# Patient Record
Sex: Male | Born: 1945 | Race: White | Hispanic: No | Smoking: Never smoker
Health system: Southern US, Community
[De-identification: ages and names within clinical notes are randomized; demographics above are authoritative.]

## PROBLEM LIST (undated history)

## (undated) DIAGNOSIS — H3530 Unspecified macular degeneration: Secondary | ICD-10-CM

## (undated) DIAGNOSIS — J189 Pneumonia, unspecified organism: Secondary | ICD-10-CM

## (undated) DIAGNOSIS — K219 Gastro-esophageal reflux disease without esophagitis: Secondary | ICD-10-CM

## (undated) DIAGNOSIS — E079 Disorder of thyroid, unspecified: Secondary | ICD-10-CM

## (undated) DIAGNOSIS — E039 Hypothyroidism, unspecified: Secondary | ICD-10-CM

## (undated) HISTORY — PX: ROTATOR CUFF REPAIR: SHX139

## (undated) HISTORY — PX: ELBOW SURGERY: SHX618

## (undated) HISTORY — PX: CHOLECYSTECTOMY: SHX55

## (undated) HISTORY — DX: Disorder of thyroid, unspecified: E07.9

## (undated) HISTORY — PX: REPAIR / REINSERT BICEPS TENDON AT ELBOW: SUR1148

## (undated) HISTORY — PX: OTHER SURGICAL HISTORY: SHX169

## (undated) HISTORY — PX: KNEE ARTHROSCOPY W/ MENISCAL REPAIR: SHX1877

## (undated) HISTORY — PX: EYE SURGERY: SHX253

## (undated) HISTORY — PX: HERNIA REPAIR: SHX51

## (undated) HISTORY — PX: TONSILLECTOMY: SUR1361

---

## 2004-08-03 ENCOUNTER — Ambulatory Visit (HOSPITAL_COMMUNITY): Admission: RE | Admit: 2004-08-03 | Discharge: 2004-08-03 | Payer: Self-pay | Admitting: Ophthalmology

## 2015-08-15 ENCOUNTER — Ambulatory Visit (INDEPENDENT_AMBULATORY_CARE_PROVIDER_SITE_OTHER): Payer: Medicare Other | Admitting: Emergency Medicine

## 2015-08-15 ENCOUNTER — Ambulatory Visit (INDEPENDENT_AMBULATORY_CARE_PROVIDER_SITE_OTHER): Payer: Medicare Other

## 2015-08-15 VITALS — BP 138/88 | HR 65 | Temp 98.4°F | Resp 16 | Ht 70.5 in | Wt 215.2 lb

## 2015-08-15 DIAGNOSIS — Z23 Encounter for immunization: Secondary | ICD-10-CM

## 2015-08-15 DIAGNOSIS — M5441 Lumbago with sciatica, right side: Secondary | ICD-10-CM | POA: Diagnosis not present

## 2015-08-15 MED ORDER — PREDNISONE 10 MG PO TABS: ORAL_TABLET | ORAL | Status: DC

## 2015-08-15 MED ORDER — HYDROCODONE-ACETAMINOPHEN 5-325 MG PO TABS: 1.0000 | ORAL_TABLET | Freq: Four times a day (QID) | ORAL | Status: DC | PRN

## 2015-08-15 NOTE — Patient Instructions (Signed)

## 2015-08-15 NOTE — Progress Notes (Signed)
This chart was scribed for Arlyss Queen, MD by Leandra Kern, Medical Scribe. This patient was seen in Room 14 and the patient's care was started at 9:29 AM.  Chief Complaint:  Chief Complaint  Patient presents with  . Back Pain  . Flu Vaccine    HPI: Gilbert Rangel is a 69 y.o. male who reports to [facility] today complaining of lower back pain, onset 4 days ago.  Pt indicates the pain to be very severe and worsening, most severe last night. He reports that it has started to radiate to his legs bilaterally. He notes the symptoms to be atraumatic. Pt states that he does have a previous history of back pain for which he used to get massage treatments for. Pt denies bladder or bowel incontinence. 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   Pt is hard of hearing.    Past Medical History  Diagnosis Date  . Thyroid disease    History reviewed. No pertinent past surgical history. Social History   Social History  . Marital Status: Divorced    Spouse Name: N/A  . Number of Children: N/A  . Years of Education: N/A   Social History Main Topics  . Smoking status: Never Smoker   . Smokeless tobacco: None  . Alcohol Use: None  . Drug Use: None  . Sexual Activity: Not Asked   Other Topics Concern  . None   Social History Narrative  . None   Family History  Problem Relation Age of Onset  . Diabetes Mother   . Cancer Father    No Known Allergies Prior to Admission medications   Medication Sig Start Date End Date Taking? Authorizing Provider  levothyroxine (SYNTHROID, LEVOTHROID) 75 MCG tablet Take 75 mcg by mouth daily before breakfast.   Yes Historical Provider, MD     ROS: The patient has back pain.  Pt denies bladder or bowel incontinence.   All other systems have been reviewed and were otherwise negative with the exception of those mentioned in the HPI and as above.    PHYSICAL EXAM: Filed Vitals:   08/15/15 0907  BP: 138/88  Pulse: 65    Temp: 98.4 F (36.9 C)  Resp: 16   Body mass index is 30.43 kg/(m^2).   General: Alert, no acute distress HEENT:  Normocephalic, atraumatic, oropharynx patent. Eye: [facility] Cardiovascular:  Regular rate and rhythm, no rubs murmurs or gallops.  No Carotid bruits, radial pulse intact. No pedal edema.  Respiratory: Clear to auscultation bilaterally.  No wheezes, rales, or rhonchi.  No cyanosis, no use of accessory musculature Abdominal: No organomegaly, abdomen is soft and non-tender, positive bowel sounds.  No masses. Musculoskeletal: Gait intact. No edema. Tender L5 S1 on the right. Positive straight leg test on the right at 70 degrees.  Skin: No rashes. Neurologic: Facial musculature symmetric. Reflexes in the knees are absent. 2+ ankle reflexes.[place] are 5/5 all muscles.  Psychiatric: Patient acts appropriately throughout our interaction. Lymphatic: No cervical or submandibular lymphadenopathy    LABS:    EKG/XRAY:   Primary read interpreted by Dr. Everlene Farrier at [facility]. Patient has arthritic changes with spur formation there is narrowing of the disc at L2-L3. ASSESSMENT/PLAN: Patient given hydrocodone for severe pain. He was placed on a prednisone taper. He will follow-up with another caregiver from massage tomorrow. Recheck towards the end of the week if he continues to have symptoms. Patient may need further imaging.I personally performed the services  described in this documentation, which was scribed in my presence. The recorded information has been reviewed and is accurate.   By signing my name below, I, Rawaa Al Rifaie, attest that this documentation has been prepared under the direction and in the presence of Arlyss Queen, MD.  Leandra Kern, Medical Scribe. 08/15/2015.  9:38 AM.     Johney Maine sideeffects, risk and benefits, and alternatives of medications d/w patient. Patient is aware that all medications have potential sideeffects and we are unable to predict every  sideeffect or drug-drug interaction that may occur.  Arlyss Queen MD 08/15/2015 9:29 AM

## 2015-08-17 ENCOUNTER — Telehealth: Payer: Self-pay

## 2015-08-17 NOTE — Telephone Encounter (Signed)
He needs to return to clinic tomorrow so I can reexamine him. He cannot take that medication and work. We can discuss other medication options on his follow-up visit

## 2015-08-17 NOTE — Telephone Encounter (Signed)
Daub - Pt said you prescribed him oxycodone on Sunday.  He wants to know if you could prescribe something a little stronger or if he can take two pills in stead of one.  However, he wants to still be able to work while using the medication.  [phone]

## 2015-08-18 ENCOUNTER — Ambulatory Visit (INDEPENDENT_AMBULATORY_CARE_PROVIDER_SITE_OTHER): Payer: Medicare Other | Admitting: Emergency Medicine

## 2015-08-18 VITALS — BP 120/72 | HR 91 | Temp 98.2°F | Resp 18 | Ht 70.5 in | Wt 221.0 lb

## 2015-08-18 DIAGNOSIS — M5441 Lumbago with sciatica, right side: Secondary | ICD-10-CM

## 2015-08-18 MED ORDER — HYDROCODONE-ACETAMINOPHEN 7.5-325 MG PO TABS: 1.0000 | ORAL_TABLET | Freq: Four times a day (QID) | ORAL | Status: DC | PRN

## 2015-08-18 NOTE — Telephone Encounter (Signed)
Called and spoke with pt and he is aware that Dr. Everlene Farrier wants to see him today.  Pt will come in today to be seen and discuss other options for pain medication per Dr. Everlene Farrier.  Nothing further is needed. Pt is aware that Dr. Everlene Farrier is here today until 67

## 2015-08-18 NOTE — Patient Instructions (Signed)

## 2015-08-18 NOTE — Progress Notes (Signed)
Patient ID: Gilbert Rangel, male   DOB: [birth year], 69 y.o.   MRN: [id number]    By signing my name below, I, Essence Howell, attest that this documentation has been prepared under the direction and in the presence of Darlyne Russian, MD Electronically Signed: Ladene Artist, ED Scribe 08/18/2015 at 2:34 PM.  Chief Complaint:  Chief Complaint  Patient presents with  . Follow-up  . Sciatica   HPI: Gilbert Rangel is a 69 y.o. male who reports to [facility] today for a follow-up regarding sciatica. Pt states that back pain has improved but the stabbing pain has traveled into his buttocks and lower extremities. He states that he is able to stand for approximately 5 minutes prior to the onset of pain. He recently received a massage by a masseuse without significant relief. Pt has also followed up with a chiropractor with relief; his next appointment is tomorrow. He recently started a 10 day course of Prednisone 4 days ago. Additionally, pt has tried ice, Aleve and Norco with mild relief.   Past Medical History  Diagnosis Date  . Thyroid disease    History reviewed. No pertinent past surgical history. Social History   Social History  . Marital Status: Divorced    Spouse Name: N/A  . Number of Children: N/A  . Years of Education: N/A   Social History Main Topics  . Smoking status: Never Smoker   . Smokeless tobacco: None  . Alcohol Use: None  . Drug Use: None  . Sexual Activity: Not Asked   Other Topics Concern  . None   Social History Narrative   Family History  Problem Relation Age of Onset  . Diabetes Mother   . Cancer Father    No Known Allergies Prior to Admission medications   Medication Sig Start Date End Date Taking? Authorizing Provider  HYDROcodone-acetaminophen (NORCO) 5-325 MG tablet Take 1 tablet by mouth every 6 (six) hours as needed for moderate pain. 08/15/15  Yes Darlyne Russian, MD  levothyroxine (SYNTHROID, LEVOTHROID) 75 MCG tablet Take 75 mcg by mouth daily before  breakfast.   Yes Historical Provider, MD  predniSONE (DELTASONE) 10 MG tablet Take 4 a day for 3 days 3 a day for 3 days 2 a day for 3 days one a day for 3 days 08/15/15  Yes Darlyne Russian, MD     ROS: The patient denies fevers, chills, night sweats, unintentional weight loss, chest pain, palpitations, wheezing, dyspnea on exertion, nausea, vomiting, abdominal pain, dysuria, hematuria, melena, numbness, weakness, or tingling. +back pain, +myalgias   All other systems have been reviewed and were otherwise negative with the exception of those mentioned in the HPI and as above.    PHYSICAL EXAM: Filed Vitals:   08/18/15 1339  BP: 120/72  Pulse: 91  Temp: 98.2 F (36.8 C)  Resp: 18   Body mass index is 31.25 kg/(m^2).  General: Alert, no acute distress HEENT:  Normocephalic, atraumatic, oropharynx patent. Eye: [name] Cardiovascular:  Regular rate and rhythm, no rubs murmurs or gallops.  No Carotid bruits, radial pulse intact. No pedal edema.  Respiratory: Clear to auscultation bilaterally.  No wheezes, rales, or rhonchi.  No cyanosis, no use of accessory musculature Abdominal: No organomegaly, abdomen is soft and non-tender, positive bowel sounds.  No masses. Musculoskeletal: Gait intact. No edema. Tenderness in lower back on the R. Straight leg on the R is positive at 90 degrees.  Skin: No rashes. Neurologic: Facial musculature symmetric. Motor strength  is 5/5. Psychiatric: Patient acts appropriately throughout our interaction. Lymphatic: No cervical or submandibular lymphadenopathy  LABS:  EKG/XRAY:   Primary read interpreted by Dr. Everlene Farrier at [facility].   ASSESSMENT/PLAN: He seemed to improve with chiropractic care. We'll continue this for now. I increased his hydrocodone to 7.5 mg.I personally performed the services described in this documentation, which was scribed in my presence. The recorded information has been reviewed and is accurate.   Gross sideeffects, risk and  benefits, and alternatives of medications d/w patient. Patient is aware that all medications have potential sideeffects and we are unable to predict every sideeffect or drug-drug interaction that may occur.  Arlyss Queen MD 08/18/2015 2:24 PM

## 2015-08-20 ENCOUNTER — Telehealth: Payer: Self-pay

## 2015-08-20 NOTE — Telephone Encounter (Signed)
Pt's son came and stated the patient was supposed to get a Rx for his pain medication. From the chart it looks like he received #20 of Hydrocodone at the visit. I am not sure what is going on but the son came back and states he never received the Rx. Can we check the database. Thanks.

## 2015-08-20 NOTE — Telephone Encounter (Signed)
Per the controlled substance database, the prescription was filled on 08/15/15 at [facility] on [address].

## 2015-08-21 MED ORDER — HYDROCODONE-ACETAMINOPHEN 7.5-325 MG PO TABS: 1.0000 | ORAL_TABLET | Freq: Four times a day (QID) | ORAL | Status: DC | PRN

## 2015-08-21 NOTE — Telephone Encounter (Signed)
At Wednesday's visit, he was supposed to get an increased dose of pain medication.  Patient did not get the prescription printed by Dr. Everlene Farrier on 08/18/15 (increased from 5 to 7.5 mg).  Database reviewed. He has 3-4 of the 5 mg tabs remaining.  Advised he can take 1.5 tabs until he gets the new Rx picked up and filled.  His son will return to get the Rx.  Meds ordered this encounter  Medications  . HYDROcodone-acetaminophen (NORCO) 7.5-325 MG tablet    Sig: Take 1 tablet by mouth every 6 (six) hours as needed.    Dispense:  20 tablet    Refill:  0    Order Specific Question:  Supervising Provider    Answer:  DOOLITTLE, ROBERT P [[id number]]

## 2015-08-22 ENCOUNTER — Other Ambulatory Visit: Payer: Self-pay | Admitting: Emergency Medicine

## 2015-08-22 MED ORDER — HYDROCODONE-ACETAMINOPHEN 7.5-325 MG PO TABS: 1.0000 | ORAL_TABLET | Freq: Four times a day (QID) | ORAL | Status: DC | PRN

## 2015-08-22 NOTE — Telephone Encounter (Signed)
All patient he can pick up the medication.

## 2015-08-22 NOTE — Telephone Encounter (Signed)
Patient is aware 

## 2015-08-24 ENCOUNTER — Emergency Department (HOSPITAL_COMMUNITY)
Admission: EM | Admit: 2015-08-24 | Discharge: 2015-08-25 | Disposition: A | Discharge status: Home / Self Care | Payer: Medicare Other | Source: Home / Self Care | Attending: Emergency Medicine | Admitting: Emergency Medicine

## 2015-08-24 ENCOUNTER — Encounter (HOSPITAL_COMMUNITY): Payer: Self-pay | Admitting: Emergency Medicine

## 2015-08-24 DIAGNOSIS — J069 Acute upper respiratory infection, unspecified: Secondary | ICD-10-CM | POA: Diagnosis not present

## 2015-08-24 DIAGNOSIS — M5431 Sciatica, right side: Secondary | ICD-10-CM

## 2015-08-24 DIAGNOSIS — E079 Disorder of thyroid, unspecified: Secondary | ICD-10-CM | POA: Insufficient documentation

## 2015-08-24 DIAGNOSIS — A419 Sepsis, unspecified organism: Secondary | ICD-10-CM | POA: Diagnosis not present

## 2015-08-24 DIAGNOSIS — Z79899 Other long term (current) drug therapy: Secondary | ICD-10-CM | POA: Insufficient documentation

## 2015-08-24 MED ORDER — GABAPENTIN 600 MG PO TABS: 300.0000 mg | ORAL_TABLET | Freq: Once | ORAL | Status: DC

## 2015-08-24 MED ORDER — HYDROMORPHONE HCL 1 MG/ML IJ SOLN: 1.0000 mg | Freq: Once | INTRAMUSCULAR | Status: DC

## 2015-08-24 MED ORDER — DEXAMETHASONE SODIUM PHOSPHATE 10 MG/ML IJ SOLN
10.0000 mg | Freq: Once | INTRAMUSCULAR | Status: AC
  Administered 2015-08-25: 10 mg via INTRAVENOUS
  Filled 2015-08-24: qty 1

## 2015-08-24 MED ORDER — GABAPENTIN 300 MG PO CAPS
300.0000 mg | ORAL_CAPSULE | Freq: Once | ORAL | Status: AC
  Administered 2015-08-25: 300 mg via ORAL
  Filled 2015-08-24: qty 1

## 2015-08-24 NOTE — ED Provider Notes (Signed)
CSN: [id number]     Arrival date & time 08/24/15  2318 History   By signing my name below, I, Forrestine Him, attest that this documentation has been prepared under the direction and in the presence of Merryl Hacker, MD.  Electronically Signed: Forrestine Him, ED Scribe. 08/24/2015. 11:40 PM.   Chief Complaint  Patient presents with  . Knee Pain  . Back Pain   The history is provided by the patient. No language interpreter was used.    HPI Comments: Gilbert Rangel brought in by EMS is a 69 y.o. male with a PMHx of thyroid disease who presents to the Emergency Department complaining of constant, ongoing lower back pain that radiates down the legs and R knee pain x 1 week; worsened this evening. Pain is described as sharp. Denies any recent injury or trauma to back or knee. Discomfort is made worse with movement. No alleviating factors. Prescribed Oxycodone  by Dr. Alfonso Ramus ([place]) attempted at home without any improvement. 5 doses of Oxycodone and 1 dose of Valium taken today. However, 250 mcg of Fentanyl also given en route to department without any relief. No recent fever, chills, nausea, vomiting, chest pain, or shortness of breath. No weakness, loss of sensation, or numbness. Denies any bowel or urinary incontinence. No saddle anesthesia. Denies any history of diabetes. Last follow up with orthopedics earlier this week. 2 injections given at that time, however, symptoms have not improved. No known allergies to medications.  PCP: Glenda Chroman., MD   Past Medical History  Diagnosis Date  . Thyroid disease    History reviewed. No pertinent past surgical history. Family History  Problem Relation Age of Onset  . Diabetes Mother   . Cancer Father    Social History  Substance Use Topics  . Smoking status: Never Smoker   . Smokeless tobacco: None  . Alcohol Use: None    Review of Systems  Constitutional: Negative for fever and chills.  Respiratory: Negative for cough and  shortness of breath.   Cardiovascular: Negative for chest pain.  Gastrointestinal: Negative for nausea, vomiting and abdominal pain.  Musculoskeletal: Positive for back pain and arthralgias.  Neurological: Negative for weakness and numbness.  Psychiatric/Behavioral: Negative for confusion.  All other systems reviewed and are negative.     Allergies  Review of patient's allergies indicates no known allergies.  Home Medications   Prior to Admission medications   Medication Sig Start Date End Date Taking? Authorizing Provider  diazepam (VALIUM) 5 MG tablet Take 5 mg by mouth every 6 (six) hours as needed for anxiety.   Yes Historical Provider, MD  levothyroxine (SYNTHROID, LEVOTHROID) 75 MCG tablet Take 75 mcg by mouth daily before breakfast.   Yes Historical Provider, MD  oxyCODONE-acetaminophen (PERCOCET) 10-325 MG tablet Take 2 tablets by mouth every 6 (six) hours as needed for pain.   Yes Historical Provider, MD  Pseudoephedrine-APAP-DM (DAYQUIL PO) Take 30 mLs by mouth daily as needed (cough).   Yes Historical Provider, MD  gabapentin (NEURONTIN) 300 MG capsule Take 1 capsule (300 mg total) by mouth 2 (two) times daily. 08/25/15   Merryl Hacker, MD  methylPREDNISolone (MEDROL DOSEPAK) 4 MG TBPK tablet Take as directed on packet. 08/25/15   Merryl Hacker, MD  predniSONE (DELTASONE) 10 MG tablet Take 4 a day for 3 days 3 a day for 3 days 2 a day for 3 days one a day for 3 days 08/15/15   Darlyne Russian, MD   Triage  Vitals: BP 116/66 mmHg  Pulse 88  Temp(Src) 98.2 F (36.8 C) (Oral)  Resp 15  SpO2 97%   Physical Exam  Constitutional: He is oriented to person, place, and time. No distress.  HENT:  Head: Normocephalic and atraumatic.  Cardiovascular: Normal rate and regular rhythm.   Pulmonary/Chest: Effort normal. No respiratory distress.  Abdominal: Soft. There is no tenderness.  Musculoskeletal: He exhibits no edema.  Tenderness palpation right paraspinous and right  lower lumbar region without step-off or deformity, positive right straight leg raise  Neurological: He is alert and oriented to person, place, and time.  Normal reflexes bilaterally  Skin: Skin is warm and dry.  Psychiatric: He has a normal mood and affect.  Nursing note and vitals reviewed.   ED Course  Procedures (including critical care time)  DIAGNOSTIC STUDIES: Oxygen Saturation is 95% on RA, adequate by my interpretation.    COORDINATION OF CARE: 11:35 PM-Discussed treatment plan with pt at bedside and pt agreed to plan.     Labs Review Labs Reviewed - No data to display  Imaging Review No results found. I have personally reviewed and evaluated these images and lab results as part of my medical decision-making.   EKG Interpretation None      MDM   Final diagnoses:  Sciatica of right side    Patient presents with lower back pain and right leg pain. Has been seen by his orthopedist and has an MRI scheduled for Friday. Reports increasing pain. Had increased dosage of his pain medication today. Denies injury. Otherwise no other red flags of back pain. Denies any symptoms of cauda equina. Normal neurologic exam with positive straight leg raise. Patient was given Decadron, Dilaudid, and gabapentin. No indication at this time for an emergent MRI given that he has a MRI scheduled in 2 days.  On reevaluation, patient reports drastic improvement of his pain. He was able to ambulate. He was noted to be slightly unstable. He has received a lot of narcotic pain medication today including thin on rounds Dilaudid. He denies any dizziness or lightheadedness. Will discharge home. Will start on a Medrol Dosepak and gabapentin. Patient was given return precautions.  After history, exam, and medical workup I feel the patient has been appropriately medically screened and is safe for discharge home. Pertinent diagnoses were discussed with the patient. Patient was given return  precautions.   I personally performed the services described in this documentation, which was scribed in my presence. The recorded information has been reviewed and is accurate.   Merryl Hacker, MD 08/25/15 [phone]

## 2015-08-24 NOTE — ED Notes (Signed)
Pt in EMS from home, reports lower back pain and R knee pain present 1 week, got worse this evening. Scheduled to have MRI done. Denies injury to area. Given 261mcg fentanyl en route

## 2015-08-25 MED ORDER — METHYLPREDNISOLONE 4 MG PO TBPK: ORAL_TABLET | ORAL | Status: DC

## 2015-08-25 MED ORDER — GABAPENTIN 300 MG PO CAPS: 300.0000 mg | ORAL_CAPSULE | Freq: Two times a day (BID) | ORAL | Status: DC

## 2015-08-25 NOTE — Discharge Instructions (Signed)

## 2015-08-25 NOTE — ED Notes (Addendum)
Pt ambulated with 2 staff assist and with use of walker. Walker provided incase pt felt unsteady on feet secondary to knee pain & pain medications given. Pt ambulated fine, no dizziness or lightheadedness, no pain. EDP notified

## 2015-08-27 ENCOUNTER — Emergency Department (HOSPITAL_COMMUNITY): Payer: Medicare Other

## 2015-08-27 ENCOUNTER — Inpatient Hospital Stay (HOSPITAL_COMMUNITY)
Admission: EM | Admit: 2015-08-27 | Discharge: 2015-08-30 | DRG: 871 | Disposition: A | Discharge status: Home / Self Care | Payer: Medicare Other | Attending: Internal Medicine | Admitting: Internal Medicine

## 2015-08-27 ENCOUNTER — Encounter (HOSPITAL_COMMUNITY): Payer: Self-pay | Admitting: Emergency Medicine

## 2015-08-27 DIAGNOSIS — M545 Low back pain: Secondary | ICD-10-CM | POA: Diagnosis present

## 2015-08-27 DIAGNOSIS — N179 Acute kidney failure, unspecified: Secondary | ICD-10-CM | POA: Diagnosis present

## 2015-08-27 DIAGNOSIS — R4182 Altered mental status, unspecified: Secondary | ICD-10-CM | POA: Diagnosis not present

## 2015-08-27 DIAGNOSIS — E039 Hypothyroidism, unspecified: Secondary | ICD-10-CM | POA: Diagnosis present

## 2015-08-27 DIAGNOSIS — A419 Sepsis, unspecified organism: Principal | ICD-10-CM | POA: Diagnosis present

## 2015-08-27 DIAGNOSIS — R740 Nonspecific elevation of levels of transaminase and lactic acid dehydrogenase [LDH]: Secondary | ICD-10-CM | POA: Diagnosis present

## 2015-08-27 DIAGNOSIS — M5441 Lumbago with sciatica, right side: Secondary | ICD-10-CM | POA: Diagnosis not present

## 2015-08-27 DIAGNOSIS — J069 Acute upper respiratory infection, unspecified: Secondary | ICD-10-CM | POA: Diagnosis not present

## 2015-08-27 DIAGNOSIS — M5126 Other intervertebral disc displacement, lumbar region: Secondary | ICD-10-CM | POA: Diagnosis present

## 2015-08-27 DIAGNOSIS — R410 Disorientation, unspecified: Secondary | ICD-10-CM | POA: Diagnosis not present

## 2015-08-27 DIAGNOSIS — G9341 Metabolic encephalopathy: Secondary | ICD-10-CM | POA: Diagnosis present

## 2015-08-27 DIAGNOSIS — M79604 Pain in right leg: Secondary | ICD-10-CM | POA: Diagnosis present

## 2015-08-27 DIAGNOSIS — R9431 Abnormal electrocardiogram [ECG] [EKG]: Secondary | ICD-10-CM | POA: Diagnosis present

## 2015-08-27 DIAGNOSIS — R7401 Elevation of levels of liver transaminase levels: Secondary | ICD-10-CM

## 2015-08-27 DIAGNOSIS — G9340 Encephalopathy, unspecified: Secondary | ICD-10-CM | POA: Diagnosis present

## 2015-08-27 DIAGNOSIS — J189 Pneumonia, unspecified organism: Secondary | ICD-10-CM | POA: Diagnosis present

## 2015-08-27 DIAGNOSIS — R030 Elevated blood-pressure reading, without diagnosis of hypertension: Secondary | ICD-10-CM | POA: Diagnosis not present

## 2015-08-27 DIAGNOSIS — B349 Viral infection, unspecified: Secondary | ICD-10-CM | POA: Diagnosis not present

## 2015-08-27 DIAGNOSIS — E860 Dehydration: Secondary | ICD-10-CM | POA: Diagnosis present

## 2015-08-27 DIAGNOSIS — Z720 Tobacco use: Secondary | ICD-10-CM | POA: Diagnosis not present

## 2015-08-27 LAB — I-STAT CHEM 8, ED
BUN: 28 mg/dL — ABNORMAL HIGH (ref 6–20)
CHLORIDE: 97 mmol/L — AB (ref 101–111)
Calcium, Ion: 1.17 mmol/L (ref 1.13–1.30)
Creatinine, Ser: 1.3 mg/dL — ABNORMAL HIGH (ref 0.61–1.24)
Glucose, Bld: 122 mg/dL — ABNORMAL HIGH (ref 65–99)
HEMATOCRIT: 53 % — AB (ref 39.0–52.0)
HEMOGLOBIN: 18 g/dL — AB (ref 13.0–17.0)
POTASSIUM: 4.1 mmol/L (ref 3.5–5.1)
Sodium: 136 mmol/L (ref 135–145)
TCO2: 27 mmol/L (ref 0–100)

## 2015-08-27 LAB — RAPID URINE DRUG SCREEN, HOSP PERFORMED
AMPHETAMINES: NOT DETECTED
BARBITURATES: NOT DETECTED
BENZODIAZEPINES: POSITIVE — AB
COCAINE: NOT DETECTED
Opiates: POSITIVE — AB
Tetrahydrocannabinol: NOT DETECTED

## 2015-08-27 LAB — DIFFERENTIAL
BASOS ABS: 0 10*3/uL (ref 0.0–0.1)
BASOS PCT: 0 %
EOS ABS: 0.1 10*3/uL (ref 0.0–0.7)
Eosinophils Relative: 1 %
Lymphocytes Relative: 9 %
Lymphs Abs: 1.5 10*3/uL (ref 0.7–4.0)
MONOS PCT: 8 %
Monocytes Absolute: 1.3 10*3/uL — ABNORMAL HIGH (ref 0.1–1.0)
NEUTROS ABS: 13.5 10*3/uL — AB (ref 1.7–7.7)
NEUTROS PCT: 82 %

## 2015-08-27 LAB — COMPREHENSIVE METABOLIC PANEL
ALBUMIN: 4.2 g/dL (ref 3.5–5.0)
ALT: 153 U/L — ABNORMAL HIGH (ref 17–63)
AST: 32 U/L (ref 15–41)
Alkaline Phosphatase: 133 U/L — ABNORMAL HIGH (ref 38–126)
Anion gap: 12 (ref 5–15)
BUN: 21 mg/dL — AB (ref 6–20)
CHLORIDE: 95 mmol/L — AB (ref 101–111)
CO2: 27 mmol/L (ref 22–32)
Calcium: 9.9 mg/dL (ref 8.9–10.3)
Creatinine, Ser: 1.35 mg/dL — ABNORMAL HIGH (ref 0.61–1.24)
GFR calc Af Amer: >60 mL/min (ref >60)
GFR, EST NON AFRICAN AMERICAN: 52 mL/min — AB (ref >60)
Glucose, Bld: 118 mg/dL — ABNORMAL HIGH (ref 65–99)
POTASSIUM: 4.1 mmol/L (ref 3.5–5.1)
SODIUM: 134 mmol/L — AB (ref 135–145)
Total Bilirubin: 1.1 mg/dL (ref 0.3–1.2)
Total Protein: 7.9 g/dL (ref 6.5–8.1)

## 2015-08-27 LAB — URINALYSIS, ROUTINE W REFLEX MICROSCOPIC
Bilirubin Urine: NEGATIVE
GLUCOSE, UA: NEGATIVE mg/dL
Hgb urine dipstick: NEGATIVE
Ketones, ur: NEGATIVE mg/dL
LEUKOCYTES UA: NEGATIVE
Nitrite: NEGATIVE
PH: 5.5 (ref 5.0–8.0)
Protein, ur: NEGATIVE mg/dL
Specific Gravity, Urine: 1.021 (ref 1.005–1.030)
Urobilinogen, UA: 1 mg/dL (ref 0.0–1.0)

## 2015-08-27 LAB — I-STAT CG4 LACTIC ACID, ED: LACTIC ACID, VENOUS: 1.57 mmol/L (ref 0.5–2.0)

## 2015-08-27 LAB — CBC
HCT: 48 % (ref 39.0–52.0)
Hemoglobin: 17.2 g/dL — ABNORMAL HIGH (ref 13.0–17.0)
MCH: 32.6 pg (ref 26.0–34.0)
MCHC: 35.8 g/dL (ref 30.0–36.0)
MCV: 91.1 fL (ref 78.0–100.0)
PLATELETS: 197 10*3/uL (ref 150–400)
RBC: 5.27 MIL/uL (ref 4.22–5.81)
RDW: 13.3 % (ref 11.5–15.5)
WBC: 16.5 10*3/uL — AB (ref 4.0–10.5)

## 2015-08-27 LAB — I-STAT TROPONIN, ED: TROPONIN I, POC: 0 ng/mL (ref 0.00–0.08)

## 2015-08-27 LAB — PROTIME-INR
INR: 1.03 (ref 0.00–1.49)
PROTHROMBIN TIME: 13.7 s (ref 11.6–15.2)

## 2015-08-27 LAB — ETHANOL

## 2015-08-27 LAB — APTT: APTT: 26 s (ref 24–37)

## 2015-08-27 MED ORDER — SODIUM CHLORIDE 0.9 % IV BOLUS (SEPSIS)
1000.0000 mL | Freq: Once | INTRAVENOUS | Status: AC
  Administered 2015-08-27: 1000 mL via INTRAVENOUS

## 2015-08-27 MED ORDER — DEXTROSE 5 % IV SOLN
1.0000 g | Freq: Once | INTRAVENOUS | Status: AC
  Administered 2015-08-27: 1 g via INTRAVENOUS
  Filled 2015-08-27: qty 10

## 2015-08-27 MED ORDER — DEXTROSE 5 % IV SOLN
500.0000 mg | Freq: Once | INTRAVENOUS | Status: AC
  Administered 2015-08-28: 500 mg via INTRAVENOUS
  Filled 2015-08-27: qty 500

## 2015-08-27 MED ORDER — ACETAMINOPHEN 650 MG RE SUPP
650.0000 mg | Freq: Once | RECTAL | Status: AC
  Administered 2015-08-27: 650 mg via RECTAL
  Filled 2015-08-27: qty 1

## 2015-08-27 NOTE — ED Provider Notes (Signed)
CSN: [id number]     Arrival date & time 08/27/15  2127 History   First MD Initiated Contact with Patient 08/27/15 2135     Chief Complaint  Patient presents with  . Code Stroke    @EDPCLEARED @ (Consider location/radiation/quality/duration/timing/severity/associated sxs/prior Treatment) HPI.....Marland Kitchen level V caveat for urgent need for intervention and acuity.  Code stroke initially activated for altered mental status.  Most of history obtained from son. Patient has been recently evaluated for low back pain by local orthopedic surgeon.  He has been on opiates and Valium.  Son reports altered mental status about 4:00 today described as "staring into space and not responding". His gait was ataxic. Patient was then transferred by EMS to the emergency department. Fever and cough noted now. No stiff neck, dysuria, chest pain  Past Medical History  Diagnosis Date  . Thyroid disease    History reviewed. No pertinent past surgical history. Family History  Problem Relation Age of Onset  . Diabetes Mother   . Cancer Father    Social History  Substance Use Topics  . Smoking status: Never Smoker   . Smokeless tobacco: Current User  . Alcohol Use: 0.0 oz/week    0 Standard drinks or equivalent per week     Comment: occasionally    Review of Systems  Reason unable to perform ROS: urgent need for intervention.      Allergies  Review of patient's allergies indicates no known allergies.  Home Medications   Prior to Admission medications   Medication Sig Start Date End Date Taking? Authorizing Provider  diazepam (VALIUM) 5 MG tablet Take 5 mg by mouth every 6 (six) hours as needed for anxiety.   Yes Historical Provider, MD  gabapentin (NEURONTIN) 300 MG capsule Take 1 capsule (300 mg total) by mouth 2 (two) times daily. 08/25/15  Yes Merryl Hacker, MD  levothyroxine (SYNTHROID, LEVOTHROID) 75 MCG tablet Take 75 mcg by mouth daily before breakfast.   Yes Historical Provider, MD   methylPREDNISolone (MEDROL DOSEPAK) 4 MG TBPK tablet Take as directed on packet. 08/25/15  Yes Merryl Hacker, MD  oxyCODONE-acetaminophen (PERCOCET) 10-325 MG tablet Take 2 tablets by mouth every 6 (six) hours as needed for pain.   Yes Historical Provider, MD  Pseudoephedrine-APAP-DM (DAYQUIL PO) Take 30 mLs by mouth daily as needed (cough).   Yes Historical Provider, MD  predniSONE (DELTASONE) 10 MG tablet Take 4 a day for 3 days 3 a day for 3 days 2 a day for 3 days one a day for 3 days Patient not taking: Reported on 08/27/2015 08/15/15   Darlyne Russian, MD   BP 131/73 mmHg  Pulse 110  Temp(Src) 103.4 F (39.7 C) (Rectal)  Resp 20  SpO2 93% Physical Exam  Constitutional:  Red-faced, coughing, confused, febrile.  HENT:  Head: Normocephalic and atraumatic.  Red face  Eyes: Conjunctivae and EOM are normal. Pupils are equal, round, and reactive to light.  Neck: Normal range of motion. Neck supple.  No meningeal signs  Cardiovascular: Normal rate and regular rhythm.   Pulmonary/Chest: Effort normal and breath sounds normal.  Abdominal: Soft. Bowel sounds are normal.  Musculoskeletal: Normal range of motion.  Neurological:  Moving all extremities. [place] name but not place or time  Skin: Skin is warm and dry.  Psychiatric: He has a normal mood and affect. His behavior is normal.  Nursing note and vitals reviewed.   ED Course  Procedures (including critical care time) Labs Review Labs Reviewed  CBC -  Abnormal; Notable for the following:    WBC 16.5 (*)    Hemoglobin 17.2 (*)    All other components within normal limits  DIFFERENTIAL - Abnormal; Notable for the following:    Neutro Abs 13.5 (*)    Monocytes Absolute 1.3 (*)    All other components within normal limits  COMPREHENSIVE METABOLIC PANEL - Abnormal; Notable for the following:    Sodium 134 (*)    Chloride 95 (*)    Glucose, Bld 118 (*)    BUN 21 (*)    Creatinine, Ser 1.35 (*)    ALT 153 (*)    Alkaline  Phosphatase 133 (*)    GFR calc non Af Amer 52 (*)    All other components within normal limits  URINE RAPID DRUG SCREEN, HOSP PERFORMED - Abnormal; Notable for the following:    Opiates POSITIVE (*)    Benzodiazepines POSITIVE (*)    All other components within normal limits  I-STAT CHEM 8, ED - Abnormal; Notable for the following:    Chloride 97 (*)    BUN 28 (*)    Creatinine, Ser 1.30 (*)    Glucose, Bld 122 (*)    Hemoglobin 18.0 (*)    HCT 53.0 (*)    All other components within normal limits  ETHANOL  PROTIME-INR  APTT  URINALYSIS, ROUTINE W REFLEX MICROSCOPIC (NOT AT [facility])  I-STAT TROPOININ, ED  I-STAT CG4 LACTIC ACID, ED    Imaging Review Ct Head Wo Contrast  08/27/2015  CLINICAL DATA:  Acute onset of weakness and confusion. Code stroke. Initial encounter. EXAM: CT HEAD WITHOUT CONTRAST TECHNIQUE: Contiguous axial images were obtained from the base of the skull through the vertex without intravenous contrast. COMPARISON:  None. FINDINGS: There is no evidence of acute infarction, mass lesion, or intra- or extra-axial hemorrhage on CT. Prominence of the ventricles and sulci reflects mild cortical volume loss. Mild scattered periventricular and subcortical white matter change likely reflects small vessel ischemic microangiopathy. The brainstem and fourth ventricle are within normal limits. The basal ganglia are unremarkable in appearance. The cerebral hemispheres demonstrate grossly normal gray-white differentiation. No mass effect or midline shift is seen. There is no evidence of fracture; visualized osseous structures are unremarkable in appearance. The orbits are within normal limits. The paranasal sinuses and mastoid air cells are well-aerated. No significant soft tissue abnormalities are seen. IMPRESSION: 1. No acute intracranial pathology seen on CT. 2. Mild cortical volume loss and scattered small vessel ischemic microangiopathy. These results were called by telephone at the  time of interpretation on 08/27/2015 at 9:41 pm to Dr. Leonel Ramsay, who verbally acknowledged these results. Electronically Signed   By: Garald Balding M.D.   On: 08/27/2015 21:45   Dg Chest Port 1 View  08/27/2015  CLINICAL DATA:  Cough and fever for 5 days. EXAM: PORTABLE CHEST 1 VIEW COMPARISON:  None. FINDINGS: Lung volumes are low. Minimal bibasilar atelectasis. The cardiomediastinal contours are normal. Pulmonary vasculature is normal. No consolidation, pleural effusion, or pneumothorax. No acute osseous abnormalities are seen. IMPRESSION: Hypoventilatory chest with mild bibasilar atelectasis. Electronically Signed   By: Jeb Levering M.D.   On: 08/27/2015 22:21   I have personally reviewed and evaluated these images and lab results as part of my medical decision-making.   EKG Interpretation None     CRITICAL CARE Performed by: Nat Christen Total critical care time: 30 Critical care time was exclusive of separately billable procedures and treating other patients. Critical care was necessary  to treat or prevent imminent or life-threatening deterioration. Critical care was time spent personally by me on the following activities: development of treatment plan with patient and/or surrogate as well as nursing, discussions with consultants, evaluation of patient's response to treatment, examination of patient, obtaining history from patient or surrogate, ordering and performing treatments and interventions, ordering and review of laboratory studies, ordering and review of radiographic studies, pulse oximetry and re-evaluation of patient's condition. MDM   Final diagnoses:  Altered mental status, unspecified altered mental status type  URI (upper respiratory infection)    Code stroke activated; however, neurologist does not agree with that diagnosis. Patient is now febrile and coughing. Suspect viral illness or early pneumonia. Vigorous IV hydration. IV Zithromax, IV Rocephin. Admit to  general medicine.    Nat Christen, MD 08/27/15 2325

## 2015-08-27 NOTE — ED Notes (Signed)
Per EMS:  Pt from home, where he began to have sudden onset of confusion and staggering at 1900.  Pt was picked bup by EMS and during transport became more and more confused.  Pt alert and oriented and arrival, but now unable to state his birthday and other questions.   Pt diaphoretic.  Recently started on Valium, hydrocodone and prednisone due to back pain he has been having.

## 2015-08-27 NOTE — Code Documentation (Signed)
Mr. Hoying presents to the [facility] via [facility] after developing sudden onset confusion at 25.  Per report the pt had an MRI today for chronic back pain and was at his baseline at 1900.  He has had a congested cough for several days & is being treated for back pain after a recent ED visit.  Upon arrival to [facility] he was found to be more alert and oriented than with EMS and found to have a T=102.6 oral.  Code stroke canceled.

## 2015-08-27 NOTE — Consult Note (Signed)
Neurology Consultation Reason for Consult: AMS Referring Physician: Lacinda Axon, B  CC: Altered Mental Status  History is obtained from:Patient  HPI: Gilbert Rangel is a 69 y.o. male with a history of lower back pain who is on steroids, oxycodone, Valium for his back pain and underwent an MRI earlier today. He last had oxycodone, Valium, gabapentin shortly before the MRI. Coming home, the son noticed that he was acting a little "spacy" but still relatively alright. At 7 PM he was still relatively normal.  Around 9:30, and the son came in and the patient was describing "helicopters" in association with [employer]. He was "talking out of his head." It was for this reason the son called 34.  En route, a code stroke was activated due to altered mental status.  After arrival, EMS actually felt like his mental status may be improving.  The patient states that he did not realize anything was wrong, and is not sure why 911 was called. He did feel like his head was a little cloudy earlier today.  LKW: 7 PM tpa given?: no, mild symptoms, suspected is not a stroke    ROS: A 14 point ROS was performed and is negative except as noted in the HPI.   Past Medical History  Diagnosis Date  . Thyroid disease      Family History  Problem Relation Age of Onset  . Diabetes Mother   . Cancer Father      Social History:  reports that he has never smoked. He uses smokeless tobacco. He reports that he drinks alcohol. He reports that he does not use illicit drugs.   Exam: Current vital signs: BP 133/80 mmHg  Pulse 110  Temp(Src) 103.4 F (39.7 C) (Rectal)  Resp 20  SpO2 91% Vital signs in last 24 hours: Temp:  [102.6 F (39.2 C)-103.4 F (39.7 C)] 103.4 F (39.7 C) (10/21 2208) Pulse Rate:  [110-120] 110 (10/21 2200) Resp:  [20] 20 (10/21 2142) BP: (133-154)/(80-84) 133/80 mmHg (10/21 2200) SpO2:  [91 %-93 %] 91 % (10/21 2200)   Physical Exam  Constitutional: Appears well-developed  and well-nourished.  Psych: Affect appropriate to situation Eyes: No scleral injection HENT: Neck is supple, no meningismus. Head: Normocephalic.  Cardiovascular: Normal rate and regular rhythm.  Respiratory: Effort normal and breath sounds normal to anterior ascultation GI: Soft.  No distension. There is no tenderness.  Skin: WDI  Neuro: Mental Status: Patient is awake, alert, oriented to person, month, year. Patient is able to give a clear and coherent history. No signs of aphasia or neglect Cranial Nerves: II: Visual Fields are full. Pupils are equal, round, and reactive to light.   III,IV, VI: EOMI without ptosis or diploplia.  V: Facial sensation is symmetric to temperature VII: Facial movement is symmetric.  VIII: hearing is intact to voice X: Uvula elevates symmetrically XI: Shoulder shrug is symmetric. XII: tongue is midline without atrophy or fasciculations.  Motor: Tone is normal. Bulk is normal. 5/5 strength was present in all four extremities.  Sensory: Sensation is symmetric to light touch Cerebellar: FNF  intact bilaterally         I have reviewed labs in epic and the results pertinent to this consultation are: Elevated white count  I have reviewed the images obtained: CT head-unremarkable  Impression: 69 year old male with mild confusion in the setting of fever and cough. I suspect this represents a mild multifactorial delirium secondary to multiple psychoactive medications including Valium, oxycodone, gabapentin as well as  mild septic encephalopathy.  I do not think that a CNS infection is likely at this time, and I think that the yield is likely to be very low and therefore would not pursue a lumbar puncture at this time. However, if his mental status were to further deteriorate then may need to consider lumbar puncture and at that time.  Recommendations: 1) hold psychoactive medications 2) sepsis evaluation 3) if the patient's mental status  continues to deteriorate, then may need to perform further evaluation at that time.   Roland Rack, MD [facility] [phone]  If 7pm- 7am, please page neurology on call as listed in [place].

## 2015-08-27 NOTE — ED Notes (Signed)
CONTACTED [place]

## 2015-08-28 ENCOUNTER — Inpatient Hospital Stay (HOSPITAL_COMMUNITY): Payer: Medicare Other

## 2015-08-28 DIAGNOSIS — J189 Pneumonia, unspecified organism: Secondary | ICD-10-CM | POA: Diagnosis present

## 2015-08-28 DIAGNOSIS — R9431 Abnormal electrocardiogram [ECG] [EKG]: Secondary | ICD-10-CM | POA: Diagnosis present

## 2015-08-28 DIAGNOSIS — R4182 Altered mental status, unspecified: Secondary | ICD-10-CM

## 2015-08-28 DIAGNOSIS — A419 Sepsis, unspecified organism: Secondary | ICD-10-CM | POA: Diagnosis present

## 2015-08-28 LAB — MRSA PCR SCREENING: MRSA by PCR: NEGATIVE

## 2015-08-28 LAB — INFLUENZA PANEL BY PCR (TYPE A & B)
H1N1FLUPCR: NOT DETECTED
INFLAPCR: NEGATIVE
Influenza B By PCR: NEGATIVE

## 2015-08-28 LAB — STREP PNEUMONIAE URINARY ANTIGEN: Strep Pneumo Urinary Antigen: NEGATIVE

## 2015-08-28 MED ORDER — SODIUM CHLORIDE 0.9 % IV SOLN: INTRAVENOUS | Status: DC

## 2015-08-28 MED ORDER — METHYLPREDNISOLONE 4 MG PO TABS
4.0000 mg | ORAL_TABLET | Freq: Two times a day (BID) | ORAL | Status: DC
  Administered 2015-08-30: 4 mg via ORAL
  Filled 2015-08-28 (×3): qty 1

## 2015-08-28 MED ORDER — SODIUM CHLORIDE 0.9 % IV SOLN
INTRAVENOUS | Status: DC
  Administered 2015-08-28 (×3): via INTRAVENOUS

## 2015-08-28 MED ORDER — METHYLPREDNISOLONE 4 MG PO TABS
4.0000 mg | ORAL_TABLET | Freq: Three times a day (TID) | ORAL | Status: AC
  Administered 2015-08-28 (×4): 4 mg via ORAL
  Filled 2015-08-28 (×4): qty 1

## 2015-08-28 MED ORDER — NICOTINE 7 MG/24HR TD PT24
7.0000 mg | MEDICATED_PATCH | Freq: Every day | TRANSDERMAL | Status: DC
  Administered 2015-08-28: 7 mg via TRANSDERMAL
  Filled 2015-08-28: qty 1

## 2015-08-28 MED ORDER — NICOTINE 14 MG/24HR TD PT24
14.0000 mg | MEDICATED_PATCH | Freq: Every day | TRANSDERMAL | Status: DC
  Filled 2015-08-28: qty 1

## 2015-08-28 MED ORDER — HYDROCODONE-ACETAMINOPHEN 5-325 MG PO TABS
2.0000 | ORAL_TABLET | Freq: Four times a day (QID) | ORAL | Status: DC | PRN
  Administered 2015-08-28: 1 via ORAL
  Administered 2015-08-28: 2 via ORAL
  Administered 2015-08-29: 1 via ORAL
  Administered 2015-08-29 – 2015-08-30 (×2): 2 via ORAL
  Administered 2015-08-30: 1 via ORAL
  Filled 2015-08-28 (×6): qty 2

## 2015-08-28 MED ORDER — DEXTROSE 5 % IV SOLN
500.0000 mg | INTRAVENOUS | Status: DC
  Administered 2015-08-28: 500 mg via INTRAVENOUS
  Filled 2015-08-28 (×2): qty 500

## 2015-08-28 MED ORDER — LEVOTHYROXINE SODIUM 50 MCG PO TABS
75.0000 ug | ORAL_TABLET | Freq: Every day | ORAL | Status: DC
  Administered 2015-08-28 – 2015-08-30 (×3): 75 ug via ORAL
  Filled 2015-08-28 (×4): qty 1

## 2015-08-28 MED ORDER — METHYLPREDNISOLONE 4 MG PO TABS
4.0000 mg | ORAL_TABLET | Freq: Three times a day (TID) | ORAL | Status: AC
  Administered 2015-08-29 (×3): 4 mg via ORAL
  Filled 2015-08-28 (×4): qty 1

## 2015-08-28 MED ORDER — CEFTRIAXONE SODIUM 1 G IJ SOLR
1.0000 g | INTRAMUSCULAR | Status: DC
  Administered 2015-08-28 – 2015-08-29 (×2): 1 g via INTRAVENOUS
  Filled 2015-08-28 (×4): qty 10

## 2015-08-28 MED ORDER — HEPARIN SODIUM (PORCINE) 5000 UNIT/ML IJ SOLN
5000.0000 [IU] | Freq: Three times a day (TID) | INTRAMUSCULAR | Status: DC
  Administered 2015-08-28 – 2015-08-29 (×6): 5000 [IU] via SUBCUTANEOUS
  Filled 2015-08-28 (×5): qty 1

## 2015-08-28 MED ORDER — METHYLPREDNISOLONE 4 MG PO TABS
4.0000 mg | ORAL_TABLET | Freq: Every day | ORAL | Status: DC
  Filled 2015-08-28: qty 1

## 2015-08-28 MED ORDER — NICOTINE 21 MG/24HR TD PT24
21.0000 mg | MEDICATED_PATCH | Freq: Every day | TRANSDERMAL | Status: DC
Start: 2015-08-29 — End: 2015-08-30
  Administered 2015-08-29: 21 mg via TRANSDERMAL
  Filled 2015-08-28 (×2): qty 1

## 2015-08-28 NOTE — Progress Notes (Addendum)
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 When the son came home the patient was very confused and delirious. He called 911 and patient was brought to the ED. In the ED oh code stroke was activated due to altered mental status. Head CT was unremarkable. She was found to be septic with fever 103.66F and WBC of 16.5 daily. Patient was also complaining of cough for past few days. UA was unremarkable and chest x-ray was inconclusive of pneumonia. Patient admitted to step down with concern for community-acquired pneumonia given his symptoms.  Assessment/Plan: Sepsis with acute metabolic encephalopathy possible for pneumonia although chest x-ray on admission is inconclusive. Placed on empiric Rocephin and azithromycin. Seen by neurology who thinks his acute encephalopathy is associated with infection. His mental status is back to baseline. No further neurological workup recommended. -Follow blood cultures. Urine drug screen positive for benzos and opiates only (patient on oxycodone and Valium at home). -I will check a repeat 2 view chest x-ray. Check flu PCR, the general antigen and HIV antibody. Strep pneumonia antigen is negative. -Supportive care with Tylenol and IV fluids.  Low back pain Head MRI of the hip on 10/20. As per son he was informed that patient had multiple bulging disks without nerve compression. Patient reports shooting pain to his right leg. No tingling or numbness symptoms, bowel or urinary symptoms. Will continue with Medrol and Vicodin. PT evaluation.  Acute kidney injury Do not have a  baseline renal function. Will monitor with gentle hydration.  Transaminitis Check hepatitis panel and liver ultrasound.  Tobacco abuse Pt uses snuff  Tobacco.. Placed on nicotine patch  Hypothyroidism Continue Synthroid.  DVT prophylaxis: Subcutaneous Lovenox Diet: Regular   Code Status: Full code Family Communication: Son at bedside Disposition Plan:  stepdown monitoring for today. Home once infection improves.   Consultants:  Neurology  Procedures:  Head CT  Antibiotics:  IV Rocephin and azithromycin  HPI/Subjective: Seen and examined. Patient fatigued but oriented. Complains of low back pain. Doesn't recall incidents on admission.   Objective: Filed Vitals:   08/28/15 0900  BP: 168/77  Pulse: 53  Temp: 98.1 F (36.7 C)  Resp: 21    Intake/Output Summary (Last 24 hours) at 08/28/15 1144 Last data filed at 08/28/15 1125  Gross per 24 hour  Intake 656.25 ml  Output   1500 ml  Net -843.75 ml   Filed Weights   08/27/15 2321 08/28/15 0107  Weight: 97.523 kg (215 lb) 96.3 kg (212 lb 4.9 oz)    Exam:   General:  Elderly male not in distress, appears fatigued  HEENT: No pallor, moist oral mucosa, supple neck  Chest: Clear to auscultation bilaterally  CVS: Normal S1 and S2, no murmurs rub or gallop  GI: Soft, nontender, nondistended, bowel sounds present  Musculoskeletal: Warm, no edema, limited ability of right hip due to pain  CNS: Alert and oriented, nonfocal  Data Reviewed: Basic Metabolic Panel:  Recent Labs Lab 08/27/15 2127 08/27/15 2133  NA 134* 136  K 4.1 4.1  CL 95* 97*  CO2 27  --   GLUCOSE 118* 122*  BUN 21* 28*  CREATININE 1.35* 1.30*  CALCIUM 9.9  --    Liver Function Tests:  Recent Labs Lab 08/27/15 2127  AST 32  ALT 153*  ALKPHOS 133*  BILITOT 1.1  PROT 7.9  ALBUMIN 4.2   No results for input(s): LIPASE, AMYLASE in the last 168 hours. No results for input(s): AMMONIA in the last 168 hours. CBC:  Recent  Labs Lab 08/27/15 2127 08/27/15 2133  WBC 16.5*  --   NEUTROABS 13.5*  --   HGB 17.2* 18.0*  HCT 48.0 53.0*  MCV 91.1  --   PLT 197  --    Cardiac Enzymes: No results for input(s): CKTOTAL, CKMB, CKMBINDEX, TROPONINI in the last 168 hours. BNP (last 3 results) No results for input(s): BNP in the last 8760 hours.  ProBNP (last 3 results) No results for input(s): PROBNP in the last 8760 hours.  CBG: No results for input(s): GLUCAP in the last 168 hours.  Recent Results (from the past 240 hour(s))  MRSA PCR Screening     Status: None   Collection Time: 08/28/15  1:18 AM  Result Value Ref Range Status   MRSA by PCR NEGATIVE NEGATIVE Final    Comment:        The GeneXpert MRSA Assay (FDA approved for NASAL specimens only), is one component of a comprehensive MRSA colonization surveillance program. It is not intended to diagnose MRSA infection nor to guide or monitor treatment for MRSA infections.      Studies: Ct Head Wo Contrast  08/27/2015  CLINICAL DATA:  Acute onset of weakness and confusion. Code stroke. Initial encounter. EXAM: CT HEAD WITHOUT CONTRAST TECHNIQUE: Contiguous axial images were obtained from the base of the skull through the vertex without intravenous contrast. COMPARISON:  None. FINDINGS: There is no evidence of acute infarction, mass lesion, or intra- or extra-axial hemorrhage on CT. Prominence of the ventricles and sulci reflects mild cortical volume loss. Mild scattered periventricular and subcortical white matter change likely reflects small vessel ischemic microangiopathy. The brainstem and fourth ventricle are within normal limits. The basal ganglia are unremarkable in appearance. The cerebral hemispheres demonstrate grossly normal gray-white differentiation. No mass effect or midline shift is seen. There is no evidence of fracture; visualized osseous structures are unremarkable in appearance. The orbits are within normal limits. The paranasal sinuses  and mastoid air cells are well-aerated. No significant soft tissue abnormalities are seen. IMPRESSION: 1. No acute intracranial pathology seen on CT. 2. Mild cortical volume loss and scattered small vessel ischemic microangiopathy. These results were called by telephone at the time of interpretation on 08/27/2015 at 9:41 pm to Dr. Leonel Ramsay, who verbally acknowledged these results. Electronically Signed   By: Garald Balding M.D.   On: 08/27/2015 21:45   Dg Chest Port 1 View  08/27/2015  CLINICAL DATA:  Cough and fever for 5 days. EXAM: PORTABLE CHEST 1 VIEW COMPARISON:  None. FINDINGS: Lung volumes are low. Minimal bibasilar atelectasis. The cardiomediastinal contours are normal. Pulmonary vasculature is normal. No consolidation, pleural effusion, or pneumothorax. No acute osseous abnormalities are seen. IMPRESSION: Hypoventilatory chest with mild bibasilar atelectasis. Electronically Signed   By: Jeb Levering M.D.   On: 08/27/2015 22:21    Scheduled Meds: . azithromycin  500 mg Intravenous Q24H  . cefTRIAXone (ROCEPHIN)  IV  1 g Intravenous Q24H  . heparin  5,000 Units Subcutaneous 3 times per day  . levothyroxine  75 mcg Oral QAC breakfast  . methylPREDNISolone  4 mg Oral  TID WC & HS   Followed by  . [START ON 08/29/2015] methylPREDNISolone  4 mg Oral TID WC   Followed by  . [START ON 08/30/2015] methylPREDNISolone  4 mg Oral BID WC   Followed by  . [START ON 08/31/2015] methylPREDNISolone  4 mg Oral Q breakfast  . nicotine  14 mg Transdermal Daily   Continuous Infusions: . sodium chloride 125 mL/hr at 08/28/15 1042      Time spent: 25 minutes    Milicent Acheampong  [facility] Pager [phone] If 7PM-7AM, please contact night-coverage at [email or web address], password [facility] 08/28/2015, 11:44 AM  LOS: 1 day

## 2015-08-28 NOTE — H&P (Addendum)
[facility] History and Physical  Gilbert Rangel BPZ:[id number] DOB: Jun 14, 1946 DOA: 08/27/2015  Referring physician: EDP PCP: Glenda Chroman., MD   Chief Complaint: AMS   HPI: Gilbert Rangel is a 69 y.o. male brought in by family with AMS.  He has had productive cough that has been worsening for the past week or so.  Today developed AMS with staring spells, and confusion.  He currently thinks he is in [place].  Review of Systems: Systems reviewed.  As above, otherwise negative  Past Medical History  Diagnosis Date  . Thyroid disease    History reviewed. No pertinent past surgical history. Social History:  reports that he has never smoked. He uses smokeless tobacco. He reports that he drinks alcohol. He reports that he does not use illicit drugs.  No Known Allergies  Family History  Problem Relation Age of Onset  . Diabetes Mother   . Cancer Father      Prior to Admission medications   Medication Sig Start Date End Date Taking? Authorizing Provider  diazepam (VALIUM) 5 MG tablet Take 5 mg by mouth every 6 (six) hours as needed for anxiety.   Yes Historical Provider, MD  gabapentin (NEURONTIN) 300 MG capsule Take 1 capsule (300 mg total) by mouth 2 (two) times daily. 08/25/15  Yes Merryl Hacker, MD  levothyroxine (SYNTHROID, LEVOTHROID) 75 MCG tablet Take 75 mcg by mouth daily before breakfast.   Yes Historical Provider, MD  methylPREDNISolone (MEDROL DOSEPAK) 4 MG TBPK tablet Take as directed on packet. 08/25/15  Yes Merryl Hacker, MD  oxyCODONE-acetaminophen (PERCOCET) 10-325 MG tablet Take 2 tablets by mouth every 6 (six) hours as needed for pain.   Yes Historical Provider, MD  Pseudoephedrine-APAP-DM (DAYQUIL PO) Take 30 mLs by mouth daily as needed (cough).   Yes Historical Provider, MD  predniSONE (DELTASONE) 10 MG tablet Take 4 a day for 3 days 3 a day for 3 days 2 a day for 3 days one a day for 3 days Patient not taking: Reported on 08/27/2015 08/15/15   Darlyne Russian, MD   Physical Exam: Filed Vitals:   08/28/15 0016  BP:   Pulse:   Temp: 100 F (37.8 C)  Resp:     BP 149/69 mmHg  Pulse 107  Temp(Src) 100 F (37.8 C) (Rectal)  Resp 20  Ht 5\' 11"  (1.803 m)  Wt 97.523 kg (215 lb)  BMI 30.00 kg/m2  SpO2 91%  General Appearance:    Alert, oriented to self, no distress, appears stated age  Head:    Normocephalic, atraumatic  Eyes:    PERRL, EOMI, sclera non-icteric        Nose:   Nares without drainage or epistaxis. Mucosa, turbinates normal  Throat:   Moist mucous membranes. Oropharynx without erythema or exudate.  Neck:   Supple. No carotid bruits.  No thyromegaly.  No lymphadenopathy.   Back:     No CVA tenderness, no spinal tenderness  Lungs:     Clear to auscultation bilaterally, without wheezes, rhonchi or rales  Chest wall:    No tenderness to palpitation  Heart:    Regular rate and rhythm without murmurs, gallops, rubs  Abdomen:     Soft, non-tender, nondistended, normal bowel sounds, no organomegaly  Genitalia:    deferred  Rectal:    deferred  Extremities:   No clubbing, cyanosis or edema.  Pulses:   2+ and symmetric all extremities  Skin:   Skin color, texture, turgor  normal, no rashes or lesions  Lymph nodes:   Cervical, supraclavicular, and axillary nodes normal  Neurologic:   CNII-XII intact. Normal strength, sensation and reflexes      throughout    Labs on Admission:  Basic Metabolic Panel:  Recent Labs Lab 08/27/15 2127 08/27/15 2133  NA 134* 136  K 4.1 4.1  CL 95* 97*  CO2 27  --   GLUCOSE 118* 122*  BUN 21* 28*  CREATININE 1.35* 1.30*  CALCIUM 9.9  --    Liver Function Tests:  Recent Labs Lab 08/27/15 2127  AST 32  ALT 153*  ALKPHOS 133*  BILITOT 1.1  PROT 7.9  ALBUMIN 4.2   No results for input(s): LIPASE, AMYLASE in the last 168 hours. No results for input(s): AMMONIA in the last 168 hours. CBC:  Recent Labs Lab 08/27/15 2127 08/27/15 2133  WBC 16.5*  --   NEUTROABS 13.5*  --    HGB 17.2* 18.0*  HCT 48.0 53.0*  MCV 91.1  --   PLT 197  --    Cardiac Enzymes: No results for input(s): CKTOTAL, CKMB, CKMBINDEX, TROPONINI in the last 168 hours.  BNP (last 3 results) No results for input(s): PROBNP in the last 8760 hours. CBG: No results for input(s): GLUCAP in the last 168 hours.  Radiological Exams on Admission: Ct Head Wo Contrast  08/27/2015  CLINICAL DATA:  Acute onset of weakness and confusion. Code stroke. Initial encounter. EXAM: CT HEAD WITHOUT CONTRAST TECHNIQUE: Contiguous axial images were obtained from the base of the skull through the vertex without intravenous contrast. COMPARISON:  None. FINDINGS: There is no evidence of acute infarction, mass lesion, or intra- or extra-axial hemorrhage on CT. Prominence of the ventricles and sulci reflects mild cortical volume loss. Mild scattered periventricular and subcortical white matter change likely reflects small vessel ischemic microangiopathy. The brainstem and fourth ventricle are within normal limits. The basal ganglia are unremarkable in appearance. The cerebral hemispheres demonstrate grossly normal gray-white differentiation. No mass effect or midline shift is seen. There is no evidence of fracture; visualized osseous structures are unremarkable in appearance. The orbits are within normal limits. The paranasal sinuses and mastoid air cells are well-aerated. No significant soft tissue abnormalities are seen. IMPRESSION: 1. No acute intracranial pathology seen on CT. 2. Mild cortical volume loss and scattered small vessel ischemic microangiopathy. These results were called by telephone at the time of interpretation on 08/27/2015 at 9:41 pm to Dr. Leonel Ramsay, who verbally acknowledged these results. Electronically Signed   By: Garald Balding M.D.   On: 08/27/2015 21:45   Dg Chest Port 1 View  08/27/2015  CLINICAL DATA:  Cough and fever for 5 days. EXAM: PORTABLE CHEST 1 VIEW COMPARISON:  None. FINDINGS: Lung  volumes are low. Minimal bibasilar atelectasis. The cardiomediastinal contours are normal. Pulmonary vasculature is normal. No consolidation, pleural effusion, or pneumothorax. No acute osseous abnormalities are seen. IMPRESSION: Hypoventilatory chest with mild bibasilar atelectasis. Electronically Signed   By: Jeb Levering M.D.   On: 08/27/2015 22:21    EKG: Independently reviewed.  Assessment/Plan Principal Problem:   CAP (community acquired pneumonia) Active Problems:   Altered mental status   EKG abnormality   Sepsis due to pneumonia ([place])   1. CAP with sepsis - CXR negative, but clinically this patient appears to have CAP and sepsis 1. Tylenol PRN fever 2. PNA pathway 3. IVF 4. Cultures pending 5. Empiric rocephin / azithromycin 2. AMS - delirium due to #1 above 1. Also  holding psychoactive medications at neurology instructions 3. EKG abnormality - spoke with Dr. Eula Fried with cards, have ordered 2D echo given the EKG wave form which is bouncing back and forth, seemingly at random, between S.Tach, and S.Tach with a RBBB.    Code Status: Full Code  Family Communication: Family at bedside Disposition Plan: Admit to inpatient   Time spent: 70 min  GARDNER, JARED M. [facility] Pager [phone]  If 7AM-7PM, please contact the day team taking care of the patient [email or web address] Password [facility] 08/28/2015, 12:33 AM

## 2015-08-28 NOTE — Progress Notes (Signed)
Subjective: Mental status has improved. No overnight adverse events reported. Patient had no complaints other than right hip pain.  Objective: Current vital signs: BP 132/69 mmHg  Pulse 83  Temp(Src) 99.2 F (37.3 C) (Oral)  Resp 27  Ht 5\' 11"  (1.803 m)  Wt 96.3 kg (212 lb 4.9 oz)  BMI 29.62 kg/m2  SpO2 95%  Neurologic Exam: Alert and in no acute distress. Patient was well-oriented to time as well as person and place. There was no expressive or receptive aphasia. Extraocular movements were full and conjugate. No facial weakness was noted. Speech was normal. Patient moved extremities equally with normal strength.  Medications: I have reviewed the patient's current medications.  Assessment/Plan: Altered mental status likely a manifestation of delirium associated with daily infectious process, probably pneumonia. Patient's mental status is back to normal at this point and he has no focal findings.  No further neurological intervention is indicated at this point. I will plan to see him in follow-up on an as-needed basis following this visit.  C.R. Nicole Kindred, MD [facility] [phone]  08/28/2015  8:42 AM

## 2015-08-29 DIAGNOSIS — R740 Nonspecific elevation of levels of transaminase and lactic acid dehydrogenase [LDH]: Secondary | ICD-10-CM

## 2015-08-29 DIAGNOSIS — R030 Elevated blood-pressure reading, without diagnosis of hypertension: Secondary | ICD-10-CM

## 2015-08-29 DIAGNOSIS — J189 Pneumonia, unspecified organism: Secondary | ICD-10-CM

## 2015-08-29 LAB — COMPREHENSIVE METABOLIC PANEL
ALK PHOS: 93 U/L (ref 38–126)
ALT: 87 U/L — AB (ref 17–63)
ANION GAP: 6 (ref 5–15)
AST: 22 U/L (ref 15–41)
Albumin: 3 g/dL — ABNORMAL LOW (ref 3.5–5.0)
BILIRUBIN TOTAL: 0.7 mg/dL (ref 0.3–1.2)
BUN: 13 mg/dL (ref 6–20)
CALCIUM: 8.6 mg/dL — AB (ref 8.9–10.3)
CO2: 27 mmol/L (ref 22–32)
CREATININE: 0.98 mg/dL (ref 0.61–1.24)
Chloride: 102 mmol/L (ref 101–111)
GFR calc Af Amer: >60 mL/min (ref >60)
Glucose, Bld: 120 mg/dL — ABNORMAL HIGH (ref 65–99)
Potassium: 3.8 mmol/L (ref 3.5–5.1)
SODIUM: 135 mmol/L (ref 135–145)
TOTAL PROTEIN: 6.3 g/dL — AB (ref 6.5–8.1)

## 2015-08-29 LAB — HEPATITIS PANEL, ACUTE
HEP B S AG: NEGATIVE
Hep A IgM: NEGATIVE
Hep B C IgM: NEGATIVE

## 2015-08-29 LAB — HIV ANTIBODY (ROUTINE TESTING W REFLEX): HIV SCREEN 4TH GENERATION: NONREACTIVE

## 2015-08-29 MED ORDER — HYDRALAZINE HCL 20 MG/ML IJ SOLN: 10.0000 mg | Freq: Four times a day (QID) | INTRAMUSCULAR | Status: DC | PRN

## 2015-08-29 MED ORDER — AZITHROMYCIN 250 MG PO TABS
500.0000 mg | ORAL_TABLET | Freq: Every day | ORAL | Status: DC
Start: 2015-08-29 — End: 2015-08-30
  Administered 2015-08-29 – 2015-08-30 (×2): 500 mg via ORAL
  Filled 2015-08-29: qty 1
  Filled 2015-08-29: qty 2

## 2015-08-29 NOTE — Progress Notes (Addendum)
[facility] PROGRESS NOTE  Gilbert Rangel LSL:[id number] DOB: [birth year] DOA: 08/27/2015 PCP: Glenda Chroman., MD  Brief narrative 69 year old male with history of recently diagnosed low back pain who is on oxycodone, Valium and steroid and had MRI of his back done on 10/20 (which per the son showed multiple disc herniation without nerve compression as per his PCP) was brought to the ED as patient was having chills and acutely confused. When the son came home the patient was very confused and delirious. He called 911 and patient was brought to the ED. In the ED oh code stroke was activated due to altered mental status. Head CT was unremarkable. She was found to be septic with fever 103.71F and WBC of 16.5 k. Patient was also complaining of cough for past few days. UA was unremarkable and chest x-ray was inconclusive of pneumonia. Patient admitted to step down with concern for community-acquired pneumonia given his symptoms.  Assessment/Plan: Sepsis with acute metabolic encephalopathy -Possibly due to community-acquired pneumonia versus acute viral respiratory infection.  - on empiric Rocephin and azithromycin. Seen by neurology who thinks his acute encephalopathy is associated with infection. His mental status is back to baseline. No further neurological workup recommended.  -Blood culture so far negative.. Urine drug screen positive for benzos and opiates only (patient on oxycodone and Valium at home). -Flu PCR negative. Urine strep antigen negative. Legionella antigen and sputum culture pending. -Supportive care with Tylenol and IV fluids.  Low back pain Had MRI of the hip on 10/20 which per patient showed disc bulging without nerve impingement or compression. Patient reports shooting pain to his right leg. No tingling or numbness symptoms, bowel or urinary symptoms. continue with Medrol and Vicodin. He reports that his PCP considering steroid injection if not improved. PT evaluation.  Acute  kidney injury Result with hydration. Possibly associated with dehydration and sepsis.  Transaminitis Hepatitis panel negative. Possibly secondary to acute viral illness on admission. LFTs improving.  Tobacco abuse Pt uses snuff  Tobacco.. Placed on nicotine patch. Counseled on cessation  Hypothyroidism Continue Synthroid.  Elevated blood pressure Possibly associated with pain. Not on any medications at home. Will place on when necessary hydralazine  DVT prophylaxis: Subcutaneous Lovenox Diet: Regular   Code Status: Full code Family Communication: Son at bedside Disposition Plan:  Transfer to medical floor. PT evaluation. Discontinue IV fluids. Home possibly tomorrow   Consultants:  Neurology  Procedures:  Head CT  Antibiotics:  IV Rocephin and azithromycin  HPI/Subjective: Seen and examined. Remains afebrile. White count normalized. Reports feeling much better and mental status back to normal.  Objective: Filed Vitals:   08/29/15 0800  BP: 161/100  Pulse: 71  Temp: 98.2 F (36.8 C)  Resp: 23    Intake/Output Summary (Last 24 hours) at 08/29/15 1054 Last data filed at 08/29/15 0948  Gross per 24 hour  Intake 3759.99 ml  Output   2350 ml  Net 1409.99 ml   Filed Weights   08/27/15 2321 08/28/15 0107  Weight: 97.523 kg (215 lb) 96.3 kg (212 lb 4.9 oz)    Exam:   General:  Elderly male not in distress  HEENT:  moist oral mucosa, supple neck  Chest: Clear to auscultation bilaterally  CVS: Normal S1 and S2, no murmurs rub or gallop  GI: Soft, nontender, nondistended, bowel sounds present  Musculoskeletal: Warm, no edema, limited ability of right hip due to pain  CNS: Alert and oriented, nonfocal  Data Reviewed: Basic Metabolic Panel:  Recent Labs  Lab 08/27/15 2127 08/27/15 2133 08/29/15 0549  NA 134* 136 135  K 4.1 4.1 3.8  CL 95* 97* 102  CO2 27  --  27  GLUCOSE 118* 122* 120*  BUN 21* 28* 13  CREATININE 1.35* 1.30* 0.98  CALCIUM  9.9  --  8.6*   Liver Function Tests:  Recent Labs Lab 08/27/15 2127 08/29/15 0549  AST 32 22  ALT 153* 87*  ALKPHOS 133* 93  BILITOT 1.1 0.7  PROT 7.9 6.3*  ALBUMIN 4.2 3.0*   No results for input(s): LIPASE, AMYLASE in the last 168 hours. No results for input(s): AMMONIA in the last 168 hours. CBC:  Recent Labs Lab 08/27/15 2127 08/27/15 2133  WBC 16.5*  --   NEUTROABS 13.5*  --   HGB 17.2* 18.0*  HCT 48.0 53.0*  MCV 91.1  --   PLT 197  --    Cardiac Enzymes: No results for input(s): CKTOTAL, CKMB, CKMBINDEX, TROPONINI in the last 168 hours. BNP (last 3 results) No results for input(s): BNP in the last 8760 hours.  ProBNP (last 3 results) No results for input(s): PROBNP in the last 8760 hours.  CBG: No results for input(s): GLUCAP in the last 168 hours.  Recent Results (from the past 240 hour(s))  Culture, blood (routine x 2) Call MD if unable to obtain prior to antibiotics being given     Status: None (Preliminary result)   Collection Time: 08/27/15  9:58 PM  Result Value Ref Range Status   Specimen Description BLOOD LEFT ARM  Final   Special Requests BOTTLES DRAWN AEROBIC AND ANAEROBIC 5ML  Final   Culture NO GROWTH 1 DAY  Final   Report Status PENDING  Incomplete  Culture, blood (routine x 2) Call MD if unable to obtain prior to antibiotics being given     Status: None (Preliminary result)   Collection Time: 08/28/15  1:16 AM  Result Value Ref Range Status   Specimen Description BLOOD RIGHT ANTECUBITAL  Final   Special Requests BOTTLES DRAWN AEROBIC AND ANAEROBIC 5CC  Final   Culture NO GROWTH 1 DAY  Final   Report Status PENDING  Incomplete  MRSA PCR Screening     Status: None   Collection Time: 08/28/15  1:18 AM  Result Value Ref Range Status   MRSA by PCR NEGATIVE NEGATIVE Final    Comment:        The GeneXpert MRSA Assay (FDA approved for NASAL specimens only), is one component of a comprehensive MRSA colonization surveillance program. It is  not intended to diagnose MRSA infection nor to guide or monitor treatment for MRSA infections.      Studies: Dg Chest 2 View  08/28/2015  CLINICAL DATA:  Pneumonia.  Productive cough for 1 week. EXAM: CHEST  2 VIEW COMPARISON:  08/27/2015 FINDINGS: Cardiomediastinal silhouette is within normal limits. The lungs are mildly hypoinflated. Lateral image is degraded by motion artifact. There is mild retrocardiac opacity in the left lower lobe, similar to the prior study. No significant right lung opacity is currently identified. No pleural effusion or pneumothorax is seen. No acute osseous abnormality is identified. IMPRESSION: Mild hypoinflation with persistent left lower lobe atelectasis versus pneumonia. Electronically Signed   By: Logan Bores M.D.   On: 08/28/2015 12:23   Ct Head Wo Contrast  08/27/2015  CLINICAL DATA:  Acute onset of weakness and confusion. Code stroke. Initial encounter. EXAM: CT HEAD WITHOUT CONTRAST TECHNIQUE: Contiguous axial images were obtained from the base of  the skull through the vertex without intravenous contrast. COMPARISON:  None. FINDINGS: There is no evidence of acute infarction, mass lesion, or intra- or extra-axial hemorrhage on CT. Prominence of the ventricles and sulci reflects mild cortical volume loss. Mild scattered periventricular and subcortical white matter change likely reflects small vessel ischemic microangiopathy. The brainstem and fourth ventricle are within normal limits. The basal ganglia are unremarkable in appearance. The cerebral hemispheres demonstrate grossly normal gray-white differentiation. No mass effect or midline shift is seen. There is no evidence of fracture; visualized osseous structures are unremarkable in appearance. The orbits are within normal limits. The paranasal sinuses and mastoid air cells are well-aerated. No significant soft tissue abnormalities are seen. IMPRESSION: 1. No acute intracranial pathology seen on CT. 2. Mild  cortical volume loss and scattered small vessel ischemic microangiopathy. These results were called by telephone at the time of interpretation on 08/27/2015 at 9:41 pm to Dr. Leonel Ramsay, who verbally acknowledged these results. Electronically Signed   By: Garald Balding M.D.   On: 08/27/2015 21:45   Dg Chest Port 1 View  08/27/2015  CLINICAL DATA:  Cough and fever for 5 days. EXAM: PORTABLE CHEST 1 VIEW COMPARISON:  None. FINDINGS: Lung volumes are low. Minimal bibasilar atelectasis. The cardiomediastinal contours are normal. Pulmonary vasculature is normal. No consolidation, pleural effusion, or pneumothorax. No acute osseous abnormalities are seen. IMPRESSION: Hypoventilatory chest with mild bibasilar atelectasis. Electronically Signed   By: Jeb Levering M.D.   On: 08/27/2015 22:21    Scheduled Meds: . azithromycin  500 mg Oral Daily  . cefTRIAXone (ROCEPHIN)  IV  1 g Intravenous Q24H  . heparin  5,000 Units Subcutaneous 3 times per day  . levothyroxine  75 mcg Oral QAC breakfast  . methylPREDNISolone  4 mg Oral TID WC   Followed by  . [START ON 08/30/2015] methylPREDNISolone  4 mg Oral BID WC   Followed by  . [START ON 08/31/2015] methylPREDNISolone  4 mg Oral Q breakfast  . nicotine  21 mg Transdermal Daily   Continuous Infusions:      Time spent: 25 minutes    Franklyn Cafaro, [place]  [facility] Pager [phone] If 7PM-7AM, please contact night-coverage at [email or web address], password [facility] 08/29/2015, 10:54 AM  LOS: 2 days

## 2015-08-29 NOTE — Progress Notes (Signed)
Pt admitted to 6N21 via bed from 2C07.  Pt AAO X 4.  Pt on RA.  Pt has 20G to Lt AC X2.  Son and belongings to bedside.  Report rcvd from [place], [place].  Pt has no complaints at the moment.  Will continue to monitor.

## 2015-08-29 NOTE — Progress Notes (Signed)
PHARMACIST - PHYSICIAN COMMUNICATION DR:   [place] CONCERNING: Antibiotic IV to Oral Route Change Policy  RECOMMENDATION: This patient is receiving azithromycin by the intravenous route.  Based on criteria approved by the Pharmacy and Therapeutics Committee, the antibiotic(s) is/are being converted to the equivalent oral dose form(s).   DESCRIPTION: These criteria include:  Patient being treated for a respiratory tract infection, urinary tract infection, cellulitis or clostridium difficile associated diarrhea if on metronidazole  The patient is not neutropenic and does not exhibit a GI malabsorption state  The patient is eating (either orally or via tube) and/or has been taking other orally administered medications for a least 24 hours  The patient is improving clinically and has a Tmax < 100.5  If you have questions about this conversion, please contact the Pharmacy Department  []   [phone] )  [facility] []   [phone] )  [facility] [x]   [phone] )  [facility] []   [phone] )  [facility] []   [phone] )  [place], PharmD, BCPS-AQ ID Clinical Pharmacist Pager [phone]

## 2015-08-29 NOTE — Progress Notes (Signed)
Report called to 6N21. Patient transferred via wheelchair with grandson. Cell phone, clothing, went with patient.

## 2015-08-30 DIAGNOSIS — R740 Nonspecific elevation of levels of transaminase and lactic acid dehydrogenase [LDH]: Secondary | ICD-10-CM

## 2015-08-30 DIAGNOSIS — M5441 Lumbago with sciatica, right side: Secondary | ICD-10-CM

## 2015-08-30 DIAGNOSIS — G9340 Encephalopathy, unspecified: Secondary | ICD-10-CM

## 2015-08-30 DIAGNOSIS — B349 Viral infection, unspecified: Secondary | ICD-10-CM

## 2015-08-30 DIAGNOSIS — J069 Acute upper respiratory infection, unspecified: Secondary | ICD-10-CM

## 2015-08-30 DIAGNOSIS — M545 Low back pain, unspecified: Secondary | ICD-10-CM | POA: Diagnosis present

## 2015-08-30 DIAGNOSIS — Z720 Tobacco use: Secondary | ICD-10-CM

## 2015-08-30 DIAGNOSIS — R7401 Elevation of levels of liver transaminase levels: Secondary | ICD-10-CM | POA: Insufficient documentation

## 2015-08-30 DIAGNOSIS — E039 Hypothyroidism, unspecified: Secondary | ICD-10-CM | POA: Diagnosis present

## 2015-08-30 DIAGNOSIS — A419 Sepsis, unspecified organism: Principal | ICD-10-CM

## 2015-08-30 LAB — LEGIONELLA PNEUMOPHILA SEROGP 1 UR AG: L. PNEUMOPHILA SEROGP 1 UR AG: NEGATIVE

## 2015-08-30 LAB — HEPATITIS PANEL, ACUTE
HCV Ab: <0.1 s/co ratio (ref 0.0–0.9)
HEP B C IGM: NEGATIVE
HEP B S AG: NEGATIVE
Hep A IgM: NEGATIVE

## 2015-08-30 MED ORDER — HYDROMORPHONE HCL 2 MG PO TABS: 1.0000 mg | ORAL_TABLET | Freq: Four times a day (QID) | ORAL | Status: DC | PRN

## 2015-08-30 MED ORDER — LIDOCAINE 5 % EX PTCH: 1.0000 | MEDICATED_PATCH | CUTANEOUS | Status: DC

## 2015-08-30 MED ORDER — LEVOFLOXACIN 750 MG PO TABS: 750.0000 mg | ORAL_TABLET | Freq: Every day | ORAL | Status: AC

## 2015-08-30 MED ORDER — NICOTINE 21 MG/24HR TD PT24: 21.0000 mg | MEDICATED_PATCH | Freq: Every day | TRANSDERMAL | Status: DC

## 2015-08-30 NOTE — Discharge Summary (Signed)
Physician Discharge Summary  Gilbert Rangel YWV:[id number] DOB: 12-Sep-1946 DOA: 08/27/2015  PCP: Glenda Chroman., MD  Admit date: 08/27/2015 Discharge date: 08/30/2015  Time spent: 35 minutes  Recommendations for Outpatient Follow-up:  Discharge home with outpatient PCP follow up. Patient and his son planning establish care with orthopedics. Complaints total seven-day course of antibiotics on 10/27.   Discharge Diagnoses:  Principal Problem: Sepsis due to community-acquired pneumonia versus acute viral respiratory illness.   Active Problems: acute encephalopathyabnormality   Sepsis due to pneumonia ([place])   Low back pain radiating to right leg   Acute encephalopathy   Hypothyroidism   URI (upper respiratory infection)   Acute viral syndrome   Discharge Condition: fair  Diet: regular  Filed Weights   08/27/15 2321 08/28/15 0107  Weight: 97.523 kg (215 lb) 96.3 kg (212 lb 4.9 oz)    History of present illness:  Please refer to admission H&P for details, in brief,  69 year old male with history of recently diagnosed low back pain who is on oxycodone, Valium and steroid and had MRI of his back done on 10/20 (which per the son showed multiple disc herniation without nerve compression as per his PCP) was brought to the ED as patient was having chills and acutely confused. When the son came home the patient was very confused and delirious. He called 911 and patient was brought to the ED. In the ED oh code stroke was activated due to altered mental status. Head CT was unremarkable. She was found to be septic with fever 103.6F and WBC of 16.5 k. Patient was also complaining of cough for past few days. UA was unremarkable and chest x-ray was inconclusive of pneumonia. Patient admitted to step down with concern for community-acquired pneumonia given his symptoms.    Hospital Course:  Sepsis with acute metabolic encephalopathy -Possibly due to community-acquired pneumonia versus acute  viral respiratory infection.  - on empiric Rocephin and azithromycin. Seen by neurology who thinks his acute encephalopathy is associated with infection. His mental status is back to baseline. No further neurological workup recommended.  -Blood culture so far negative.. Urine drug screen positive for benzos and opiates only (patient on oxycodone and Valium at home). -Flu PCR negative. Urine strep antigen negative. Legionella antigen and sputum culture pending. -Patient remains afebrile and dynamically stable. I will discharge him on oral Levaquin to complete a total seven-day course of antibiotic.  Low back pain Had MRI of the hip on 10/20 which per patient showed disc bulging without nerve impingement or compression. Patient reports shooting pain to his right leg. No tingling or numbness symptoms, bowel or urinary symptoms. continue with Medrol prescribed as outpatient He reports that his PCP considering steroid injection if not improved.  Patient is able to ambulate but having episode of excruciating pain. I will discharge him on low-dose Dilaudid as needed along with Lidoderm patch. Patient  was seen by orthopedics Dr Alfonso Ramus few days ago but since to see a different orthopedics and is in the process of scheduling an appointment.   patient was also prescribed Neurontin prior to admission which he is advised to continue.  Acute kidney injury Resolved  with hydration. Possibly associated with dehydration and sepsis.   acute Transaminitis Hepatitis panel negative. Possibly secondary to acute viral illness on admission. LFTs improved.  Tobacco abuse Pt uses snuff Tobacco.. Placed on nicotine patch. Counseled on cessation  Hypothyroidism Continue Synthroid.  Elevated blood pressure Possibly associated with pain. Not on any medications at hocurrently stable.ophylaxis:  Subcutaneous Lovenox Diet: Regular   Code Status: Full code Family Communication: spoke with son on the phone Disposition  Plan: discharge home with outpatient follow-up  Consultants:  Neurology  Procedures:  Head CT  Antibiotics:  IV Rocephin and azithromycin  Discharge Exam: Filed Vitals:   08/30/15 0551  BP: 162/94  Pulse: 78  Temp: 98.2 F (36.8 C)  Resp: 20    General: Elderly male not in distress  HEENT: moist oral mucosa, supple neck  Chest: Clear to auscultation bilaterally  CVS: Normal S1 and S2, no murmurs rub or gallop  GI: Soft, nontender, nondistended, bowel sounds present  Musculoskeletal: Warm, no edema, no back tenderness or limited ROM CNS: Alert and oriented   Discharge Instructions    Current Discharge Medication List    START taking these medications   Details  HYDROmorphone (DILAUDID) 2 MG tablet Take 0.5 tablets (1 mg total) by mouth every 6 (six) hours as needed for severe pain. Qty: 30 tablet, Refills: 0    levofloxacin (LEVAQUIN) 750 MG tablet Take 1 tablet (750 mg total) by mouth daily. Qty: 4 tablet, Refills: 0    lidocaine (LIDODERM) 5 % Place 1 patch onto the skin daily. Remove & Discard patch within 12 hours or as directed by MD Qty: 6 patch, Refills: 0    nicotine (NICODERM CQ - DOSED IN MG/24 HOURS) 21 mg/24hr patch Place 1 patch (21 mg total) onto the skin daily. Qty: 28 patch, Refills: 0      CONTINUE these medications which have NOT CHANGED   Details  diazepam (VALIUM) 5 MG tablet Take 5 mg by mouth every 6 (six) hours as needed for anxiety.    gabapentin (NEURONTIN) 300 MG capsule Take 1 capsule (300 mg total) by mouth 2 (two) times daily. Qty: 30 capsule, Refills: 0    levothyroxine (SYNTHROID, LEVOTHROID) 75 MCG tablet Take 75 mcg by mouth daily before breakfast.    methylPREDNISolone (MEDROL DOSEPAK) 4 MG TBPK tablet Take as directed on packet. Qty: 21 tablet, Refills: 0    Pseudoephedrine-APAP-DM (DAYQUIL PO) Take 30 mLs by mouth daily as needed (cough).      STOP taking these medications     oxyCODONE-acetaminophen (PERCOCET)  10-325 MG tablet        No Known Allergies Follow-up Information    Follow up with VYAS,DHRUV B., MD. Schedule an appointment as soon as possible for a visit in 1 week.   Specialty:  Internal Medicine   Contact information:   [place] [address] [phone]        The results of significant diagnostics from this hospitalization (including imaging, microbiology, ancillary and laboratory) are listed below for reference.    Significant Diagnostic Studies: Dg Chest 2 View  08/28/2015  CLINICAL DATA:  Pneumonia.  Productive cough for 1 week. EXAM: CHEST  2 VIEW COMPARISON:  08/27/2015 FINDINGS: Cardiomediastinal silhouette is within normal limits. The lungs are mildly hypoinflated. Lateral image is degraded by motion artifact. There is mild retrocardiac opacity in the left lower lobe, similar to the prior study. No significant right lung opacity is currently identified. No pleural effusion or pneumothorax is seen. No acute osseous abnormality is identified. IMPRESSION: Mild hypoinflation with persistent left lower lobe atelectasis versus pneumonia. Electronically Signed   By: Logan Bores M.D.   On: 08/28/2015 12:23   Dg Lumbar Spine Complete  08/15/2015  CLINICAL DATA:  Right-sided low back pain. EXAM: LUMBAR SPINE - COMPLETE 4+ VIEW COMPARISON:  None.  FINDINGS: No evidence of acute fracture or subluxation. No focal bone lesion or bone destruction. Bone cortex and trabecular architecture appear intact. There are multilevel osteoarthritic changes of the lumbosacral spine, with narrowing of the disc spaces, discogenic endplate sclerosis, mild remodeling of the vertebral bodies and osteophyte formation. These changes are most pronounced at L2-L3, L4-L5 and L5-S1. Posterior facet arthropathy is seen in the lower lumbosacral spine. IMPRESSION: No acute fracture or dislocation identified about the lumbosacral spine. Multilevel osteoarthritic changes. Electronically Signed   By: Fidela Salisbury M.D.   On: 08/15/2015 11:29   Ct Head Wo Contrast  08/27/2015  CLINICAL DATA:  Acute onset of weakness and confusion. Code stroke. Initial encounter. EXAM: CT HEAD WITHOUT CONTRAST TECHNIQUE: Contiguous axial images were obtained from the base of the skull through the vertex without intravenous contrast. COMPARISON:  None. FINDINGS: There is no evidence of acute infarction, mass lesion, or intra- or extra-axial hemorrhage on CT. Prominence of the ventricles and sulci reflects mild cortical volume loss. Mild scattered periventricular and subcortical white matter change likely reflects small vessel ischemic microangiopathy. The brainstem and fourth ventricle are within normal limits. The basal ganglia are unremarkable in appearance. The cerebral hemispheres demonstrate grossly normal gray-white differentiation. No mass effect or midline shift is seen. There is no evidence of fracture; visualized osseous structures are unremarkable in appearance. The orbits are within normal limits. The paranasal sinuses and mastoid air cells are well-aerated. No significant soft tissue abnormalities are seen. IMPRESSION: 1. No acute intracranial pathology seen on CT. 2. Mild cortical volume loss and scattered small vessel ischemic microangiopathy. These results were called by telephone at the time of interpretation on 08/27/2015 at 9:41 pm to Dr. Leonel Ramsay, who verbally acknowledged these results. Electronically Signed   By: Garald Balding M.D.   On: 08/27/2015 21:45   Dg Chest Port 1 View  08/27/2015  CLINICAL DATA:  Cough and fever for 5 days. EXAM: PORTABLE CHEST 1 VIEW COMPARISON:  None. FINDINGS: Lung volumes are low. Minimal bibasilar atelectasis. The cardiomediastinal contours are normal. Pulmonary vasculature is normal. No consolidation, pleural effusion, or pneumothorax. No acute osseous abnormalities are seen. IMPRESSION: Hypoventilatory chest with mild bibasilar atelectasis. Electronically Signed   By:  Jeb Levering M.D.   On: 08/27/2015 22:21    Microbiology: Recent Results (from the past 240 hour(s))  Culture, blood (routine x 2) Call MD if unable to obtain prior to antibiotics being given     Status: None (Preliminary result)   Collection Time: 08/27/15  9:58 PM  Result Value Ref Range Status   Specimen Description BLOOD LEFT ARM  Final   Special Requests BOTTLES DRAWN AEROBIC AND ANAEROBIC 5ML  Final   Culture NO GROWTH 1 DAY  Final   Report Status PENDING  Incomplete  Culture, blood (routine x 2) Call MD if unable to obtain prior to antibiotics being given     Status: None (Preliminary result)   Collection Time: 08/28/15  1:16 AM  Result Value Ref Range Status   Specimen Description BLOOD RIGHT ANTECUBITAL  Final   Special Requests BOTTLES DRAWN AEROBIC AND ANAEROBIC 5CC  Final   Culture NO GROWTH 1 DAY  Final   Report Status PENDING  Incomplete  MRSA PCR Screening     Status: None   Collection Time: 08/28/15  1:18 AM  Result Value Ref Range Status   MRSA by PCR NEGATIVE NEGATIVE Final    Comment:        The GeneXpert MRSA  Assay (FDA approved for NASAL specimens only), is one component of a comprehensive MRSA colonization surveillance program. It is not intended to diagnose MRSA infection nor to guide or monitor treatment for MRSA infections.      Labs: Basic Metabolic Panel:  Recent Labs Lab 08/27/15 2127 08/27/15 2133 08/29/15 0549  NA 134* 136 135  K 4.1 4.1 3.8  CL 95* 97* 102  CO2 27  --  27  GLUCOSE 118* 122* 120*  BUN 21* 28* 13  CREATININE 1.35* 1.30* 0.98  CALCIUM 9.9  --  8.6*   Liver Function Tests:  Recent Labs Lab 08/27/15 2127 08/29/15 0549  AST 32 22  ALT 153* 87*  ALKPHOS 133* 93  BILITOT 1.1 0.7  PROT 7.9 6.3*  ALBUMIN 4.2 3.0*   No results for input(s): LIPASE, AMYLASE in the last 168 hours. No results for input(s): AMMONIA in the last 168 hours. CBC:  Recent Labs Lab 08/27/15 2127 08/27/15 2133  WBC 16.5*  --    NEUTROABS 13.5*  --   HGB 17.2* 18.0*  HCT 48.0 53.0*  MCV 91.1  --   PLT 197  --    Cardiac Enzymes: No results for input(s): CKTOTAL, CKMB, CKMBINDEX, TROPONINI in the last 168 hours. BNP: BNP (last 3 results) No results for input(s): BNP in the last 8760 hours.  ProBNP (last 3 results) No results for input(s): PROBNP in the last 8760 hours.  CBG: No results for input(s): GLUCAP in the last 168 hours.     SignedLouellen Molder  [facility] 08/30/2015, 10:23 AM

## 2015-08-30 NOTE — Care Management Important Message (Signed)
Important Message  Patient Details  Name: Gilbert Rangel MRN: [id number] Date of Birth: 04/05/1946   Medicare Important Message Given:  Yes-second notification given    Delorse Lek 08/30/2015, 12:31 PM

## 2015-08-30 NOTE — Discharge Instructions (Signed)

## 2015-09-02 LAB — CULTURE, BLOOD (ROUTINE X 2)
CULTURE: NO GROWTH
Culture: NO GROWTH

## 2015-09-16 ENCOUNTER — Ambulatory Visit: Payer: Self-pay | Admitting: Orthopedic Surgery

## 2015-09-16 NOTE — Patient Instructions (Addendum)
YOUR PROCEDURE IS SCHEDULED ON : 09/29/15  REPORT TO [facility] MAIN ENTRANCE FOLLOW SIGNS TO EAST ELEVATOR - GO TO 3rd FLOOR CHECK IN AT 3 EAST NURSES STATION (SHORT STAY) AT:  8:30 AM  CALL THIS NUMBER IF YOU HAVE PROBLEMS THE MORNING OF SURGERY [phone]  REMEMBER:ONLY 1 PER PERSON MAY GO TO SHORT STAY WITH YOU TO GET READY THE MORNING OF YOUR SURGERY  DO NOT EAT FOOD OR DRINK LIQUIDS AFTER MIDNIGHT  TAKE THESE MEDICINES THE MORNING OF SURGERY: neurontin, synthroid  YOU MAY NOT HAVE ANY METAL ON YOUR BODY INCLUDING HAIR PINS AND PIERCING'S. DO NOT WEAR JEWELRY, MAKEUP, LOTIONS, POWDERS OR PERFUMES. DO NOT WEAR NAIL POLISH. DO NOT SHAVE 48 HRS PRIOR TO SURGERY. MEN MAY SHAVE FACE AND NECK.  DO NOT [place]. [facility] IS NOT RESPONSIBLE FOR VALUABLES.  CONTACTS, DENTURES OR PARTIALS MAY NOT BE WORN TO SURGERY. LEAVE SUITCASE IN CAR. CAN BE BROUGHT TO ROOM AFTER SURGERY.  PATIENTS DISCHARGED THE DAY OF SURGERY WILL NOT BE ALLOWED TO DRIVE HOME.  PLEASE READ OVER THE FOLLOWING INSTRUCTION SHEETS _________________________________________________________________________________                                          [facility] - PREPARING FOR SURGERY  Before surgery, you can play an important role.  Because skin is not sterile, your skin needs to be as free of germs as possible.  You can reduce the number of germs on your skin by washing with CHG (chlorahexidine gluconate) soap before surgery.  CHG is an antiseptic cleaner which kills germs and bonds with the skin to continue killing germs even after washing. Please DO NOT use if you have an allergy to CHG or antibacterial soaps.  If your skin becomes reddened/irritated stop using the CHG and inform your nurse when you arrive at Short Stay. Do not shave (including legs and underarms) for at least 48 hours prior to the first CHG shower.  You may shave your face. Please follow these  instructions carefully:   1.  Shower with CHG Soap the night before surgery and the  morning of Surgery.   2.  If you choose to wash your hair, wash your hair first as usual with your  normal  Shampoo.   3.  After you shampoo, rinse your hair and body thoroughly to remove the  shampoo.                                         4.  Use CHG as you would any other liquid soap.  You can apply chg directly  to the skin and wash . Gently wash with scrungie or clean wascloth    5.  Apply the CHG Soap to your body ONLY FROM THE NECK DOWN.   Do not use on open                           Wound or open sores. Avoid contact with eyes, ears mouth and genitals (private parts).                        Genitals (private parts) with your normal soap.  6.  Wash thoroughly, paying special attention to the area where your surgery  will be performed.   7.  Thoroughly rinse your body with warm water from the neck down.   8.  DO NOT shower/wash with your normal soap after using and rinsing off  the CHG Soap .                9.  Pat yourself dry with a clean towel.             10.  Wear clean night clothes to bed after shower             11.  Place clean sheets on your bed the night of your first shower and do not  sleep with pets.  Day of Surgery : Do not apply any lotions/deodorants the morning of surgery.  Please wear clean clothes to the hospital/surgery center.  FAILURE TO FOLLOW THESE INSTRUCTIONS MAY RESULT IN THE CANCELLATION OF YOUR SURGERY    PATIENT SIGNATURE_________________________________  ______________________________________________________________________     Adam Phenix  An incentive spirometer is a tool that can help keep your lungs clear and active. This tool measures how well you are filling your lungs with each breath. Taking long deep breaths may help reverse or decrease the chance of developing breathing (pulmonary) problems (especially infection)  following:  A long period of time when you are unable to move or be active. BEFORE THE PROCEDURE   If the spirometer includes an indicator to show your best effort, your nurse or respiratory therapist will set it to a desired goal.  If possible, sit up straight or lean slightly forward. Try not to slouch.  Hold the incentive spirometer in an upright position. INSTRUCTIONS FOR USE  1. Sit on the edge of your bed if possible, or sit up as far as you can in bed or on a chair. 2. Hold the incentive spirometer in an upright position. 3. Breathe out normally. 4. Place the mouthpiece in your mouth and seal your lips tightly around it. 5. Breathe in slowly and as deeply as possible, raising the piston or the ball toward the top of the column. 6. Hold your breath for 3-5 seconds or for as long as possible. Allow the piston or ball to fall to the bottom of the column. 7. Remove the mouthpiece from your mouth and breathe out normally. 8. Rest for a few seconds and repeat Steps 1 through 7 at least 10 times every 1-2 hours when you are awake. Take your time and take a few normal breaths between deep breaths. 9. The spirometer may include an indicator to show your best effort. Use the indicator as a goal to work toward during each repetition. 10. After each set of 10 deep breaths, practice coughing to be sure your lungs are clear. If you have an incision (the cut made at the time of surgery), support your incision when coughing by placing a pillow or rolled up towels firmly against it. Once you are able to get out of bed, walk around indoors and cough well. You may stop using the incentive spirometer when instructed by your caregiver.  RISKS AND COMPLICATIONS  Take your time so you do not get dizzy or light-headed.  If you are in pain, you may need to take or ask for pain medication before doing incentive spirometry. It is harder to take a deep breath if you are having pain. AFTER USE  Rest and  breathe slowly and  easily.  It can be helpful to keep track of a log of your progress. Your caregiver can provide you with a simple table to help with this. If you are using the spirometer at home, follow these instructions: [place] IF:   You are having difficultly using the spirometer.  You have trouble using the spirometer as often as instructed.  Your pain medication is not giving enough relief while using the spirometer.  You develop fever of 100.5 F (38.1 C) or higher. SEEK IMMEDIATE MEDICAL CARE IF:   You cough up bloody sputum that had not been present before.  You develop fever of 102 F (38.9 C) or greater.  You develop worsening pain at or near the incision site. MAKE SURE YOU:   Understand these instructions.  Will watch your condition.  Will get help right away if you are not doing well or get worse. Document Released: 03/05/2007 Document Revised: 01/15/2012 Document Reviewed: 05/06/2007 [facility] Patient Information 2014 [place], [place].   ________________________________________________________________________

## 2015-09-17 ENCOUNTER — Encounter (HOSPITAL_COMMUNITY)
Admission: RE | Admit: 2015-09-17 | Discharge: 2015-09-17 | Disposition: A | Discharge status: Home / Self Care | Payer: Medicare Other | Source: Ambulatory Visit | Attending: Specialist | Admitting: Specialist

## 2015-09-17 ENCOUNTER — Encounter (HOSPITAL_COMMUNITY): Payer: Self-pay

## 2015-09-17 ENCOUNTER — Ambulatory Visit (HOSPITAL_COMMUNITY)
Admission: RE | Admit: 2015-09-17 | Discharge: 2015-09-17 | Disposition: A | Discharge status: Home / Self Care | Payer: Medicare Other | Source: Ambulatory Visit | Attending: Orthopedic Surgery | Admitting: Orthopedic Surgery

## 2015-09-17 ENCOUNTER — Ambulatory Visit (HOSPITAL_COMMUNITY)
Admission: RE | Admit: 2015-09-17 | Discharge: 2015-09-17 | Disposition: A | Discharge status: Home / Self Care | Payer: Medicare Other | Source: Ambulatory Visit | Attending: Anesthesiology | Admitting: Anesthesiology

## 2015-09-17 DIAGNOSIS — Z01812 Encounter for preprocedural laboratory examination: Secondary | ICD-10-CM | POA: Insufficient documentation

## 2015-09-17 DIAGNOSIS — J189 Pneumonia, unspecified organism: Secondary | ICD-10-CM

## 2015-09-17 DIAGNOSIS — Z01818 Encounter for other preprocedural examination: Secondary | ICD-10-CM

## 2015-09-17 DIAGNOSIS — M4186 Other forms of scoliosis, lumbar region: Secondary | ICD-10-CM | POA: Diagnosis not present

## 2015-09-17 DIAGNOSIS — M545 Low back pain: Secondary | ICD-10-CM | POA: Diagnosis not present

## 2015-09-17 DIAGNOSIS — M5126 Other intervertebral disc displacement, lumbar region: Secondary | ICD-10-CM

## 2015-09-17 DIAGNOSIS — IMO0002 Reserved for concepts with insufficient information to code with codable children: Secondary | ICD-10-CM

## 2015-09-17 HISTORY — DX: Hypothyroidism, unspecified: E03.9

## 2015-09-17 LAB — CBC
HEMATOCRIT: 42.4 % (ref 39.0–52.0)
HEMOGLOBIN: 14.6 g/dL (ref 13.0–17.0)
MCH: 31.7 pg (ref 26.0–34.0)
MCHC: 34.4 g/dL (ref 30.0–36.0)
MCV: 92 fL (ref 78.0–100.0)
Platelets: 265 10*3/uL (ref 150–400)
RBC: 4.61 MIL/uL (ref 4.22–5.81)
RDW: 13.5 % (ref 11.5–15.5)
WBC: 6.9 10*3/uL (ref 4.0–10.5)

## 2015-09-17 LAB — SURGICAL PCR SCREEN
MRSA, PCR: NEGATIVE
STAPHYLOCOCCUS AUREUS: NEGATIVE

## 2015-09-17 LAB — BASIC METABOLIC PANEL
ANION GAP: 9 (ref 5–15)
BUN: 12 mg/dL (ref 6–20)
CHLORIDE: 102 mmol/L (ref 101–111)
CO2: 26 mmol/L (ref 22–32)
Calcium: 9 mg/dL (ref 8.9–10.3)
Creatinine, Ser: 1.19 mg/dL (ref 0.61–1.24)
GFR calc Af Amer: >60 mL/min (ref >60)
GFR calc non Af Amer: >60 mL/min (ref >60)
GLUCOSE: 108 mg/dL — AB (ref 65–99)
POTASSIUM: 4.2 mmol/L (ref 3.5–5.1)
Sodium: 137 mmol/L (ref 135–145)

## 2015-09-17 NOTE — Pre-Procedure Instructions (Addendum)
EKG 08-28-15 epic - results discussed with cardiology with internist note who stated to have echo.  Echo then done and study conclusions are within normal limits. Echo 08-28-15 epic CXR 08-28-15 epic - from previous admission with pneumonia. CXR was not clear. Spoke with Dr. Delma Post and he requested getting a new CXR.   CXR taken today at pre-op appt.  Results available in chart, WNL.

## 2015-09-20 ENCOUNTER — Other Ambulatory Visit (HOSPITAL_COMMUNITY): Payer: Medicare Other

## 2015-09-23 ENCOUNTER — Ambulatory Visit: Payer: Self-pay | Admitting: Orthopedic Surgery

## 2015-09-23 NOTE — H&P (Signed)
Gilbert Rangel is an 69 y.o. male.   Chief Complaint: back and right leg pain HPI: The patient is a 69 year old male who presents today for follow up of their back. The patient is being followed for their back pain. They are now 4 week(s) out from when symptoms began. Symptoms reported today include: pain. The patient states that they are doing poorly. Current treatment includes: activity modification and Neurontin. The following medication has been used for pain control: Dilaudid. The patient reports their current pain level to be mild (to severe). The patient presents today following right L5 SNRB. Note for "Follow-up back": The patient states that he did get 30 hours of relief from the pain with the SNRB.  Gilbert Rangel follows up. He had his selective nerve root block at L5. He said he had complete relief of the symptoms when he actually did the selective nerve root block. It felt fantastic and for about 30 hours, he had near complete relief of his pain first time in over a month. It was an L5 selective nerve root block. He now reports the pain has returned in his buttock, thigh, calf, and into the great toe. He has taken Neurontin and the Dilaudid. He is significantly disabled by that he is recovering from apparently a bout of pneumonia. He reports overall being better.  REVIEW OF SYSTEMS Negative. Again he has had multiple trips to the emergency room.   Past Medical History  Diagnosis Date  . Thyroid disease   . Hypothyroidism     Past Surgical History  Procedure Laterality Date  . Cholecystectomy    . Rotator cuff repair    . Knee arthroscopy w/ meniscal repair    . Repair / reinsert biceps tendon at elbow    . Tonsillectomy      Family History  Problem Relation Age of Onset  . Diabetes Mother   . Cancer Father    Social History:  reports that he has never smoked. He uses smokeless tobacco. He reports that he drinks alcohol. He reports that he does not use illicit  drugs.  Allergies: No Known Allergies   (Not in a hospital admission)  No results found for this or any previous visit (from the past 48 hour(s)). No results found.  Review of Systems  Constitutional: Negative.   HENT: Negative.   Eyes: Negative.   Respiratory: Negative.   Cardiovascular: Negative.   Gastrointestinal: Negative.   Genitourinary: Negative.   Musculoskeletal: Positive for back pain.  Skin: Negative.   Neurological: Positive for sensory change and focal weakness.    There were no vitals taken for this visit. Physical Exam  Constitutional: He is oriented to person, place, and time. He appears well-developed. He appears distressed.  HENT:  Head: Normocephalic.  Eyes: Pupils are equal, round, and reactive to light.  Neck: Normal range of motion.  Cardiovascular: Normal rate.   Respiratory: Effort normal.  GI: Soft.  Musculoskeletal:  He is in moderate distress. Mood and affect is appropriate. Walks with antalgic gait. Straight leg raise with buttock and thigh pain, exacerbated with dorsal augmentation maneuver. EHL is still 4+/5 on the right compared to the left. Slight altered sensation in L5 dermatome.  Neurological: He is alert and oriented to person, place, and time.    Again his radiographs AP, lateral, flexion and extension demonstrates disc generation at L5-1. No instability on flexion and extension. He has a transitional vertebrae.  His MRI demonstrates facet arthropathy, disc protrusions and  neuroforaminal stenosis on the right. He has it bilaterally.  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  He has no back pain with complete relief from his pain from the selective nerve root block for 30 hours. I had extensive discussion with [name] concerning his current pathology, relevant anatomy and  treatment options. I do feel his symptoms were consistent with L5 radiculopathy. He has no back pain. He has leg pain. He has EHL weakness and with his neural foraminal stenosis noted on his MRI, I feel it was confirmed by the diagnostic portion of the selective nerve root block and unfortunately, has not given him lasting relief. Given the corelation there, I feel his pain generators is the L5 foramen on the right. We therefore discussed options of living with his symptoms. I do not feel repeat injection will give him any lasting relief. We discussed foraminotomy and discectomy if a fragment is noted at L5. He still has space at L5-S1 at the disc. I do not feel he has pedicle on pedicle compression that would necessitate a concomitant fusion. He has no back pain or instability. I did, however, indicate that there is possibility it may require that in the future TLIF and a fusion. He is at 25. He has trips to the emergency room. Again, he is getting over a recent bout of pneumonia. He has no other cause of his radicular pain There is a minimal scoliosis. Again, he reports no back pain. He continues to remain significantly disabled by this. We discussed foraminotomy, lateral recess decompression.  I had an extensive discussion of the risks and benefits of the lumbar decompression with the patient including bleeding, infection, damage to neurovascular structures, epidural fibrosis, CSF leak requiring repair. We also discussed increase in pain, adjacent segment disease, recurrent disc herniation, need for future surgery including repeat decompression and/or fusion. We also discussed risks of postoperative hematoma, paralysis, anesthetic complications including DVT, PE, death, cardiopulmonary dysfunction. In addition, the perioperative and postoperative courses were discussed in detail including the rehabilitative time and return to functional activity and work. I provided the patient with an illustrated handout and  utilized the appropriate surgical models.  His medical physicians Dr. Woody Seller. We will proceed accordingly once his over his bout of pneumonia. Hopefully, we can get clearance as soon as possible. In the interim, avoid extension favor flexion, knee to chest, analgesics. If there is any change in the interim, he is to call. 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 We discussed the possibility of residual symptoms related to neural compression and nerve root healing.  Plan microlumbar decompression L5-S1 right, possible L4-5  Charon Akamine M. PA-C for Dr. Tonita Cong 09/23/2015, 2:18 PM

## 2015-09-29 ENCOUNTER — Encounter (HOSPITAL_COMMUNITY)
Admission: RE | Disposition: A | Discharge status: Home / Self Care | Payer: Self-pay | Source: Ambulatory Visit | Attending: Specialist

## 2015-09-29 ENCOUNTER — Inpatient Hospital Stay (HOSPITAL_COMMUNITY): Payer: Medicare Other

## 2015-09-29 ENCOUNTER — Ambulatory Visit (HOSPITAL_COMMUNITY)
Admission: RE | Admit: 2015-09-29 | Discharge: 2015-09-30 | Disposition: A | Discharge status: Home / Self Care | Payer: Medicare Other | Source: Ambulatory Visit | Attending: Specialist | Admitting: Specialist

## 2015-09-29 ENCOUNTER — Inpatient Hospital Stay (HOSPITAL_COMMUNITY): Payer: Medicare Other | Admitting: Anesthesiology

## 2015-09-29 ENCOUNTER — Encounter (HOSPITAL_COMMUNITY): Payer: Self-pay | Admitting: *Deleted

## 2015-09-29 DIAGNOSIS — M4807 Spinal stenosis, lumbosacral region: Secondary | ICD-10-CM | POA: Diagnosis not present

## 2015-09-29 DIAGNOSIS — E039 Hypothyroidism, unspecified: Secondary | ICD-10-CM | POA: Diagnosis not present

## 2015-09-29 DIAGNOSIS — M5127 Other intervertebral disc displacement, lumbosacral region: Secondary | ICD-10-CM | POA: Diagnosis present

## 2015-09-29 DIAGNOSIS — Z9049 Acquired absence of other specified parts of digestive tract: Secondary | ICD-10-CM | POA: Insufficient documentation

## 2015-09-29 DIAGNOSIS — Z79899 Other long term (current) drug therapy: Secondary | ICD-10-CM | POA: Diagnosis not present

## 2015-09-29 DIAGNOSIS — Z8701 Personal history of pneumonia (recurrent): Secondary | ICD-10-CM | POA: Diagnosis not present

## 2015-09-29 DIAGNOSIS — M5126 Other intervertebral disc displacement, lumbar region: Secondary | ICD-10-CM | POA: Diagnosis not present

## 2015-09-29 DIAGNOSIS — Z419 Encounter for procedure for purposes other than remedying health state, unspecified: Secondary | ICD-10-CM

## 2015-09-29 DIAGNOSIS — M48061 Spinal stenosis, lumbar region without neurogenic claudication: Secondary | ICD-10-CM | POA: Diagnosis present

## 2015-09-29 HISTORY — PX: LUMBAR LAMINECTOMY/DECOMPRESSION MICRODISCECTOMY: SHX5026

## 2015-09-29 SURGERY — LUMBAR LAMINECTOMY/DECOMPRESSION MICRODISCECTOMY 2 LEVELS
Anesthesia: General | Site: Back

## 2015-09-29 MED ORDER — BISACODYL 5 MG PO TBEC: 5.0000 mg | DELAYED_RELEASE_TABLET | Freq: Every day | ORAL | Status: DC | PRN

## 2015-09-29 MED ORDER — ROCURONIUM BROMIDE 100 MG/10ML IV SOLN
INTRAVENOUS | Status: DC | PRN
  Administered 2015-09-29: 5 mg via INTRAVENOUS
  Administered 2015-09-29: 10 mg via INTRAVENOUS
  Administered 2015-09-29: 45 mg via INTRAVENOUS

## 2015-09-29 MED ORDER — LIDOCAINE HCL (CARDIAC) 20 MG/ML IV SOLN
INTRAVENOUS | Status: AC
  Filled 2015-09-29: qty 5

## 2015-09-29 MED ORDER — HYDROMORPHONE HCL 4 MG PO TABS: 2.0000 mg | ORAL_TABLET | ORAL | Status: DC | PRN

## 2015-09-29 MED ORDER — LEVOTHYROXINE SODIUM 75 MCG PO TABS
75.0000 ug | ORAL_TABLET | Freq: Every day | ORAL | Status: DC
  Administered 2015-09-30: 75 ug via ORAL
  Filled 2015-09-29 (×2): qty 1

## 2015-09-29 MED ORDER — ONDANSETRON HCL 4 MG/2ML IJ SOLN
INTRAMUSCULAR | Status: DC | PRN
  Administered 2015-09-29: 4 mg via INTRAVENOUS

## 2015-09-29 MED ORDER — SODIUM CHLORIDE 0.9 % IR SOLN
Status: DC | PRN
  Administered 2015-09-29: 500 mL

## 2015-09-29 MED ORDER — MIDAZOLAM HCL 2 MG/2ML IJ SOLN
INTRAMUSCULAR | Status: AC
  Filled 2015-09-29: qty 2

## 2015-09-29 MED ORDER — HYDROCODONE-ACETAMINOPHEN 5-325 MG PO TABS
1.0000 | ORAL_TABLET | ORAL | Status: DC | PRN
  Administered 2015-09-29 – 2015-09-30 (×3): 1 via ORAL
  Administered 2015-09-30: 2 via ORAL
  Filled 2015-09-29: qty 1
  Filled 2015-09-29: qty 2
  Filled 2015-09-29 (×2): qty 1

## 2015-09-29 MED ORDER — DEXAMETHASONE SODIUM PHOSPHATE 10 MG/ML IJ SOLN
INTRAMUSCULAR | Status: AC
  Filled 2015-09-29: qty 1

## 2015-09-29 MED ORDER — CEFAZOLIN SODIUM-DEXTROSE 2-3 GM-% IV SOLR
2.0000 g | Freq: Three times a day (TID) | INTRAVENOUS | Status: AC
  Administered 2015-09-29 – 2015-09-30 (×3): 2 g via INTRAVENOUS
  Filled 2015-09-29 (×3): qty 50

## 2015-09-29 MED ORDER — DOCUSATE SODIUM 100 MG PO CAPS: 100.0000 mg | ORAL_CAPSULE | Freq: Two times a day (BID) | ORAL | Status: DC | PRN

## 2015-09-29 MED ORDER — BUPIVACAINE-EPINEPHRINE (PF) 0.5% -1:200000 IJ SOLN
INTRAMUSCULAR | Status: DC | PRN
  Administered 2015-09-29: 15 mL

## 2015-09-29 MED ORDER — METHOCARBAMOL 1000 MG/10ML IJ SOLN
500.0000 mg | Freq: Four times a day (QID) | INTRAMUSCULAR | Status: DC | PRN
  Administered 2015-09-29: 500 mg via INTRAVENOUS
  Filled 2015-09-29 (×2): qty 5

## 2015-09-29 MED ORDER — KCL IN DEXTROSE-NACL 20-5-0.45 MEQ/L-%-% IV SOLN
INTRAVENOUS | Status: DC
  Administered 2015-09-29: 16:00:00 via INTRAVENOUS
  Filled 2015-09-29 (×2): qty 1000

## 2015-09-29 MED ORDER — THROMBIN 5000 UNITS EX SOLR
CUTANEOUS | Status: DC | PRN
  Administered 2015-09-29: 10000 [IU] via TOPICAL

## 2015-09-29 MED ORDER — RISAQUAD PO CAPS
1.0000 | ORAL_CAPSULE | Freq: Every day | ORAL | Status: DC
  Administered 2015-09-29 – 2015-09-30 (×2): 1 via ORAL
  Filled 2015-09-29 (×2): qty 1

## 2015-09-29 MED ORDER — HYDROMORPHONE HCL 2 MG PO TABS: 2.0000 mg | ORAL_TABLET | Freq: Four times a day (QID) | ORAL | Status: DC | PRN

## 2015-09-29 MED ORDER — SENNOSIDES-DOCUSATE SODIUM 8.6-50 MG PO TABS: 1.0000 | ORAL_TABLET | Freq: Every evening | ORAL | Status: DC | PRN

## 2015-09-29 MED ORDER — PROPOFOL 10 MG/ML IV BOLUS
INTRAVENOUS | Status: AC
  Filled 2015-09-29: qty 20

## 2015-09-29 MED ORDER — THROMBIN 5000 UNITS EX SOLR
CUTANEOUS | Status: AC
  Filled 2015-09-29: qty 10000

## 2015-09-29 MED ORDER — CEFAZOLIN SODIUM-DEXTROSE 2-3 GM-% IV SOLR
2.0000 g | INTRAVENOUS | Status: AC
  Administered 2015-09-29: 2 g via INTRAVENOUS

## 2015-09-29 MED ORDER — ROCURONIUM BROMIDE 100 MG/10ML IV SOLN
INTRAVENOUS | Status: AC
  Filled 2015-09-29: qty 1

## 2015-09-29 MED ORDER — DOCUSATE SODIUM 100 MG PO CAPS
100.0000 mg | ORAL_CAPSULE | Freq: Two times a day (BID) | ORAL | Status: DC
  Administered 2015-09-29 – 2015-09-30 (×2): 100 mg via ORAL

## 2015-09-29 MED ORDER — HYDROMORPHONE HCL 1 MG/ML IJ SOLN: 0.5000 mg | INTRAMUSCULAR | Status: DC | PRN

## 2015-09-29 MED ORDER — ONDANSETRON HCL 4 MG/2ML IJ SOLN: 4.0000 mg | INTRAMUSCULAR | Status: DC | PRN

## 2015-09-29 MED ORDER — MIDAZOLAM HCL 5 MG/5ML IJ SOLN
INTRAMUSCULAR | Status: DC | PRN
  Administered 2015-09-29: 2 mg via INTRAVENOUS

## 2015-09-29 MED ORDER — BUPIVACAINE-EPINEPHRINE (PF) 0.5% -1:200000 IJ SOLN
INTRAMUSCULAR | Status: AC
  Filled 2015-09-29: qty 30

## 2015-09-29 MED ORDER — LACTATED RINGERS IV SOLN
INTRAVENOUS | Status: DC
  Administered 2015-09-29: 12:00:00 via INTRAVENOUS
  Administered 2015-09-29: 1000 mL via INTRAVENOUS

## 2015-09-29 MED ORDER — ALUM & MAG HYDROXIDE-SIMETH 200-200-20 MG/5ML PO SUSP: 30.0000 mL | Freq: Four times a day (QID) | ORAL | Status: DC | PRN

## 2015-09-29 MED ORDER — OXYCODONE-ACETAMINOPHEN 5-325 MG PO TABS: 1.0000 | ORAL_TABLET | ORAL | Status: DC | PRN

## 2015-09-29 MED ORDER — HYDROMORPHONE HCL 1 MG/ML IJ SOLN: 0.2500 mg | INTRAMUSCULAR | Status: DC | PRN

## 2015-09-29 MED ORDER — ACETAMINOPHEN 325 MG PO TABS: 650.0000 mg | ORAL_TABLET | ORAL | Status: DC | PRN

## 2015-09-29 MED ORDER — FENTANYL CITRATE (PF) 100 MCG/2ML IJ SOLN
INTRAMUSCULAR | Status: DC | PRN
  Administered 2015-09-29: 100 ug via INTRAVENOUS
  Administered 2015-09-29: 50 ug via INTRAVENOUS

## 2015-09-29 MED ORDER — NICOTINE 21 MG/24HR TD PT24
21.0000 mg | MEDICATED_PATCH | Freq: Every day | TRANSDERMAL | Status: DC
  Administered 2015-09-29 – 2015-09-30 (×2): 21 mg via TRANSDERMAL
  Filled 2015-09-29 (×2): qty 1

## 2015-09-29 MED ORDER — FENTANYL CITRATE (PF) 250 MCG/5ML IJ SOLN
INTRAMUSCULAR | Status: AC
  Filled 2015-09-29: qty 5

## 2015-09-29 MED ORDER — LIDOCAINE HCL (CARDIAC) 20 MG/ML IV SOLN
INTRAVENOUS | Status: DC | PRN
  Administered 2015-09-29: 180 mg via INTRAVENOUS

## 2015-09-29 MED ORDER — SODIUM CHLORIDE 0.9 % IR SOLN
Status: AC
  Filled 2015-09-29: qty 1

## 2015-09-29 MED ORDER — SUCCINYLCHOLINE CHLORIDE 20 MG/ML IJ SOLN
INTRAMUSCULAR | Status: DC | PRN
  Administered 2015-09-29: 100 mg via INTRAVENOUS

## 2015-09-29 MED ORDER — SUGAMMADEX SODIUM 200 MG/2ML IV SOLN
INTRAVENOUS | Status: DC | PRN
  Administered 2015-09-29: 200 mg via INTRAVENOUS

## 2015-09-29 MED ORDER — ACETAMINOPHEN 650 MG RE SUPP: 650.0000 mg | RECTAL | Status: DC | PRN

## 2015-09-29 MED ORDER — ONDANSETRON HCL 4 MG/2ML IJ SOLN
INTRAMUSCULAR | Status: AC
  Filled 2015-09-29: qty 2

## 2015-09-29 MED ORDER — PHENYLEPHRINE 40 MCG/ML (10ML) SYRINGE FOR IV PUSH (FOR BLOOD PRESSURE SUPPORT)
PREFILLED_SYRINGE | INTRAVENOUS | Status: AC
  Filled 2015-09-29: qty 10

## 2015-09-29 MED ORDER — PHENYLEPHRINE HCL 10 MG/ML IJ SOLN
INTRAMUSCULAR | Status: DC | PRN
  Administered 2015-09-29: 40 ug via INTRAVENOUS
  Administered 2015-09-29: 80 ug via INTRAVENOUS
  Administered 2015-09-29 (×2): 40 ug via INTRAVENOUS

## 2015-09-29 MED ORDER — GABAPENTIN 300 MG PO CAPS
300.0000 mg | ORAL_CAPSULE | Freq: Two times a day (BID) | ORAL | Status: DC
  Administered 2015-09-29 – 2015-09-30 (×2): 300 mg via ORAL
  Filled 2015-09-29 (×3): qty 1

## 2015-09-29 MED ORDER — METHOCARBAMOL 500 MG PO TABS: 500.0000 mg | ORAL_TABLET | Freq: Four times a day (QID) | ORAL | Status: DC | PRN

## 2015-09-29 MED ORDER — CEFAZOLIN SODIUM-DEXTROSE 2-3 GM-% IV SOLR
INTRAVENOUS | Status: AC
  Filled 2015-09-29: qty 50

## 2015-09-29 MED ORDER — LACTATED RINGERS IV SOLN
INTRAVENOUS | Status: DC
  Administered 2015-09-29: 14:00:00 via INTRAVENOUS

## 2015-09-29 MED ORDER — PHENOL 1.4 % MT LIQD
1.0000 | OROMUCOSAL | Status: DC | PRN
  Filled 2015-09-29: qty 177

## 2015-09-29 MED ORDER — MENTHOL 3 MG MT LOZG: 1.0000 | LOZENGE | OROMUCOSAL | Status: DC | PRN

## 2015-09-29 MED ORDER — SUGAMMADEX SODIUM 200 MG/2ML IV SOLN
INTRAVENOUS | Status: AC
  Filled 2015-09-29: qty 2

## 2015-09-29 SURGICAL SUPPLY — 50 items
BAG SPEC THK2 15X12 ZIP CLS (MISCELLANEOUS) ×1
BAG ZIPLOCK 12X15 (MISCELLANEOUS) ×2 IMPLANT
CLEANER TIP ELECTROSURG 2X2 (MISCELLANEOUS) ×3 IMPLANT
CLOSURE WOUND 1/2 X4 (GAUZE/BANDAGES/DRESSINGS) ×1
CLOTH 2% CHLOROHEXIDINE 3PK (PERSONAL CARE ITEMS) ×3 IMPLANT
DRAPE MICROSCOPE LEICA (MISCELLANEOUS) ×3 IMPLANT
DRAPE SHEET LG 3/4 BI-LAMINATE (DRAPES) IMPLANT
DRAPE SURG 17X11 SM STRL (DRAPES) ×3 IMPLANT
DRAPE UTILITY XL STRL (DRAPES) ×3 IMPLANT
DRSG AQUACEL AG ADV 3.5X 4 (GAUZE/BANDAGES/DRESSINGS) IMPLANT
DRSG AQUACEL AG ADV 3.5X 6 (GAUZE/BANDAGES/DRESSINGS) ×2 IMPLANT
DURAPREP 26ML APPLICATOR (WOUND CARE) ×3 IMPLANT
DURASEAL SPINE SEALANT 3ML (MISCELLANEOUS) IMPLANT
ELECT BLADE TIP CTD 4 INCH (ELECTRODE) ×2 IMPLANT
ELECT REM PT RETURN 9FT ADLT (ELECTROSURGICAL) ×3
ELECTRODE REM PT RTRN 9FT ADLT (ELECTROSURGICAL) ×1 IMPLANT
GLOVE BIOGEL PI IND STRL 7.0 (GLOVE) ×1 IMPLANT
GLOVE BIOGEL PI INDICATOR 7.0 (GLOVE) ×2
GLOVE SURG SS PI 7.0 STRL IVOR (GLOVE) ×3 IMPLANT
GLOVE SURG SS PI 7.5 STRL IVOR (GLOVE) ×3 IMPLANT
GLOVE SURG SS PI 8.0 STRL IVOR (GLOVE) ×6 IMPLANT
GOWN STRL REUS W/TWL XL LVL3 (GOWN DISPOSABLE) ×6 IMPLANT
IV CATH 14GX2 1/4 (CATHETERS) ×2 IMPLANT
KIT BASIN OR (CUSTOM PROCEDURE TRAY) ×3 IMPLANT
KIT POSITIONING SURG ANDREWS (MISCELLANEOUS) ×3 IMPLANT
MANIFOLD NEPTUNE II (INSTRUMENTS) ×3 IMPLANT
NDL SPNL 18GX3.5 QUINCKE PK (NEEDLE) ×2 IMPLANT
NEEDLE SPNL 18GX3.5 QUINCKE PK (NEEDLE) ×9 IMPLANT
PACK LAMINECTOMY ORTHO (CUSTOM PROCEDURE TRAY) ×3 IMPLANT
PATTIES SURGICAL .5 X.5 (GAUZE/BANDAGES/DRESSINGS) ×3 IMPLANT
PATTIES SURGICAL .75X.75 (GAUZE/BANDAGES/DRESSINGS) ×3 IMPLANT
PATTIES SURGICAL 1X1 (DISPOSABLE) IMPLANT
PEN SKIN MARKING BROAD (MISCELLANEOUS) ×3 IMPLANT
RUBBERBAND STERILE (MISCELLANEOUS) ×3 IMPLANT
SPONGE LAP 4X18 X RAY DECT (DISPOSABLE) IMPLANT
SPONGE SURGIFOAM ABS GEL 100 (HEMOSTASIS) ×3 IMPLANT
STAPLER VISISTAT (STAPLE) IMPLANT
STRIP CLOSURE SKIN 1/2X4 (GAUZE/BANDAGES/DRESSINGS) ×1 IMPLANT
SUT NURALON 4 0 TR CR/8 (SUTURE) IMPLANT
SUT PROLENE 3 0 PS 2 (SUTURE) ×2 IMPLANT
SUT VIC AB 1 CT1 27 (SUTURE) ×3
SUT VIC AB 1 CT1 27XBRD ANTBC (SUTURE) IMPLANT
SUT VIC AB 1-0 CT2 27 (SUTURE) ×2 IMPLANT
SUT VIC AB 2-0 CT1 27 (SUTURE)
SUT VIC AB 2-0 CT1 TAPERPNT 27 (SUTURE) IMPLANT
SUT VIC AB 2-0 CT2 27 (SUTURE) ×2 IMPLANT
SYR 3ML LL SCALE MARK (SYRINGE) IMPLANT
TOWEL OR 17X26 10 PK STRL BLUE (TOWEL DISPOSABLE) ×3 IMPLANT
TOWEL OR NON WOVEN STRL DISP B (DISPOSABLE) ×3 IMPLANT
YANKAUER SUCT BULB TIP NO VENT (SUCTIONS) ×3 IMPLANT

## 2015-09-29 NOTE — Evaluation (Signed)
Physical Therapy Evaluation Patient Details Name: JACORRI CROMBIE MRN: [id number] DOB: 10-Dec-1945 Today's Date: 09/29/2015   History of Present Illness  Spinal stenosis, herniated nucleus pulposus, S/P MICRO LUMBER DECOMPRESSION L5-S1 ON RIGHT POSSIBLE L4-5   Clinical Impression  Pt admitted with above diagnosis. Pt currently with functional limitations due to the deficits listed below (see PT Problem List).  Pt will benefit from skilled PT to increase their independence and safety with mobility to allow discharge to home.     Follow Up Recommendations No PT follow up    Equipment Recommendations  None recommended by PT    Recommendations for Other Services       Precautions / Restrictions Precautions Precautions: Back Precaution Comments: reviewed back precautions, patient was not engaged, more concerned about the foley. Restrictions Weight Bearing Restrictions: No      Mobility  Bed Mobility Overal bed mobility: Needs Assistance Bed Mobility: Sidelying to Sit Rolling: Supervision Sidelying to sit: Supervision       General bed mobility comments: cues for technique, HOB was raised.  Transfers Overall transfer level: Needs assistance Equipment used: Rolling walker (2 wheeled) Transfers: Sit to/from Stand Sit to Stand: Supervision            Ambulation/Gait Ambulation/Gait assistance: Supervision Ambulation Distance (Feet): 175 Feet Assistive device: Rolling walker (2 wheeled) Gait Pattern/deviations: Step-through pattern;Decreased dorsiflexion - right     General Gait Details: gait is steady, cues for sfaety with R foot drop .  Stairs            Wheelchair Mobility    Modified Rankin (Stroke Patients Only)       Balance                                             Pertinent Vitals/Pain Pain Assessment: 0-10 Pain Score: 6  Pain Location: foley catheter, back is not in pain Pain Descriptors / Indicators:  Discomfort;Grimacing;Tender Pain Intervention(s): Monitored during session;Limited activity within patient's tolerance    Home Living Family/patient expects to be discharged to:: Private residence Living Arrangements: Alone;Children   Type of Home: House Home Access: Stairs to enter Entrance Stairs-Rails: None Entrance Stairs-Number of Steps: 4 Home Layout: One level Home Equipment: None      Prior Function Level of Independence: Independent               Hand Dominance        Extremity/Trunk Assessment   Upper Extremity Assessment: Defer to OT evaluation           Lower Extremity Assessment: RLE deficits/detail RLE Deficits / Details: noted  foot drop, decreased light touch. patient did not appear to be aware at first that  the foot was weak.    Cervical / Trunk Assessment: Normal  Communication   Communication: No difficulties  Cognition Arousal/Alertness: Awake/alert Behavior During Therapy: WFL for tasks assessed/performed;Agitated (about all the lines and tubes and catheter.) Overall Cognitive Status: Within Functional Limits for tasks assessed                      General Comments      Exercises        Assessment/Plan    PT Assessment Patient needs continued PT services  PT Diagnosis Difficulty walking;Acute pain   PT Problem List Decreased strength;Decreased activity tolerance;Decreased mobility;Decreased knowledge of precautions;Pain;Impaired sensation  PT Treatment Interventions DME instruction;Gait training;Stair training;Functional mobility training;Therapeutic activities;Patient/family education   PT Goals (Current goals can be found in the Care Plan section) Acute Rehab PT Goals Patient Stated Goal: to go home tonight PT Goal Formulation: With patient Time For Goal Achievement: 10/01/15 Potential to Achieve Goals: Good    Frequency Min 5X/week   Barriers to discharge        Co-evaluation               End of  Session   Activity Tolerance: Patient tolerated treatment well Patient left: in chair;with call bell/phone within reach;with chair alarm set Nurse Communication: Mobility status    Functional Assessment Tool Used: clinical judgement Functional Limitation: Mobility: Walking and moving around Mobility: Walking and Moving Around Current Status [id number]): At least 20 percent but less than 40 percent impaired, limited or restricted Mobility: Walking and Moving Around Goal Status [id number]): At least 1 percent but less than 20 percent impaired, limited or restricted    Time: [id number] PT Time Calculation (min) (ACUTE ONLY): 20 min   Charges:   PT Evaluation $Initial PT Evaluation Tier I: 1 Procedure     PT G Codes:   PT G-Codes **NOT FOR INPATIENT CLASS** Functional Assessment Tool Used: clinical judgement Functional Limitation: Mobility: Walking and moving around Mobility: Walking and Moving Around Current Status [id number]): At least 20 percent but less than 40 percent impaired, limited or restricted Mobility: Walking and Moving Around Goal Status [id number]): At least 1 percent but less than 20 percent impaired, limited or restricted    Claretha Cooper 09/29/2015, 5:48 PM Tresa Endo PT [phone]

## 2015-09-29 NOTE — Discharge Instructions (Signed)
Walk As Tolerated utilizing back precautions.  No bending, twisting, or lifting.  No driving for 2 weeks.   °Aquacel dressing may remain in place until follow up. May shower with aquacel dressing in place. If the dressing peels off or becomes saturated, you may remove aquacel dressing and place gauze and tape dressing which should be kept clean and dry and changed daily. Do not remove steri-strips if they are present. °See Dr. Rae Sutcliffe in office in 10 to 14 days. Begin taking aspirin 81mg per day starting 4 days after your surgery if not allergic to aspirin or on another blood thinner. °Walk daily even outside. Use a cane or walker only if necessary. °Avoid sitting on soft sofas. ° °

## 2015-09-29 NOTE — Op Note (Signed)
NAME:  FRITZ, LICEA NO.:  [id number]  MEDICAL RECORD NO.:  [id number]  LOCATION:  [facility]                         FACILITY:  [facility]  PHYSICIAN:  Susa Day, M.D.    DATE OF BIRTH:  17-Oct-1946  DATE OF PROCEDURE:  09/29/2015 DATE OF DISCHARGE:                              OPERATIVE REPORT   PREOPERATIVE DIAGNOSIS:  Spinal stenosis, herniated nucleus pulposus (HNP), L5-S1, right.  POSTOPERATIVE DIAGNOSIS:  Spinal stenosis, herniated nucleus pulposus (HNP), L5-S1, right.  PROCEDURES PERFORMED: 1. Microlumbar decompression, L4-5, right. 2. Foraminotomies, L5-S1, right. 3. Decompression, L4-5. 4. Hemilaminectomy, L5.  ANESTHESIA:  General.  ASSISTANT:  Cleophas Dunker, PA.  HISTORY:  This is a 69 year old male, with EHL weakness, neural tension signs, severe pain secondary to disc herniation foraminal and disc degeneration, minimal back pain, predominantly buttock and leg pain, refractory to conservative treatment, it is felt that the L5 nerve was being compressed into the foramen, multifactorial, we felt that foraminotomy, decompression, partial facetectomy, and hemilaminectomy would fully decompress the L5 root.  Risks and benefits were discussed, including, bleeding, infection, damage to neurovascular structures, no change in symptoms, worsening symptoms, DVT, PE, anesthetic complications, etc.  TECHNIQUE:  With the patient in supine position, after induction of adequate general anesthesia, 2 g Kefzol, placed prone on the Henning frame.  All bony prominences well padded lumbar region was prepped and draped in usual sterile fashion.  Two 18-gauge spinal needle was utilized to localize L4-5 and L5-S1 interspace, confirmed with x-ray. Incision was made from spinous process of L5 to below the S1. Subcutaneous tissue was dissected.  Electrocautery was utilized to achieve hemostasis.  Dorsolumbar fascia identified via the line of skin incision.   Paraspinous muscle elevated from lamina and L5-S1.  Operating microscope was draped and brought in the surgical field.  Leksell rongeur was utilized to remove the portion of the lamina of L5.  This was then completed with a 2 and 3 mm Kerrison, detached from the cephalad edge of the ligamentum flavum.  Straight curette utilized to detach ligamentum flavum from the cephalad edge of L5, hypertrophic facet was noted using an osteotome, removed portion of the inferior process of L5.  Following this, we performed a foraminotomy of S1. Hypertrophic ligamentum flavum were removed from the interspace.  Neuro patties protecting the neural elements at all times.  We decompressed the lateral recess to the medial border of the pedicle.  We then performed a foraminotomy of L5, removing the superior articulating process of S1.  There was disc herniation noted at the disc space, extending out into the foramen.  After protecting the neural elements, I made an annulotomy.  Copious portions of the disc material was removed from this annular space medially and out into the foramen of the micro pituitary.  This was further mobilized with a nerve hook.  Routine decompression to the lateral border of the pedicle.  Following this, a neural probe passed freely up the foramen of L5 prior to that, it was fairly stenotic.  At L4-5 and to follow the root up to of L4-5.  Just above the pedicle of L5.  Confirmatory radiograph obtained.  Irrigated disc space with antibiotic  irrigation, catheter lavage.  No further rupture checked beneath thecal sac.  The axilla of the root, shoulder, and both foramen.  No residual neural compression.  Good epidural fat was noted.  I do feel that his pathology was decreased disc space hypertrophic ligamentum and the superior articulating process of S1 and the disc herniation.  I felt this adequately satisfactorily decompressed the L5 root.  No evidence of CSF leakage or active bleeding,  copious irrigation and removed the [facility] retractor, after confirmatory radiograph obtained.  I irrigated the paraspinous musculature, closed the dorsolumbar fascia with 1 Vicryl, subcu with 2-0, and the skin with subcuticular Prolene.  Sterile dressing applied.  Placed supine on the hospital bed, extubated without difficulty, and transported to the recovery room in satisfactory condition.  The patient tolerated the procedure well.  No complications.  Assistant, Cleophas Dunker, [place].  Minimal blood loss.     Susa Day, M.D.     Geralynn Rile  D:  09/29/2015  T:  09/29/2015  Job:  [id number]

## 2015-09-29 NOTE — H&P (View-Only) (Signed)
Gilbert Rangel is an 69 y.o. male.   Chief Complaint: back and right leg pain HPI: The patient is a 69 year old male who presents today for follow up of their back. The patient is being followed for their back pain. They are now 4 week(s) out from when symptoms began. Symptoms reported today include: pain. The patient states that they are doing poorly. Current treatment includes: activity modification and Neurontin. The following medication has been used for pain control: Dilaudid. The patient reports their current pain level to be mild (to severe). The patient presents today following right L5 SNRB. Note for "Follow-up back": The patient states that he did get 30 hours of relief from the pain with the SNRB.  Gilbert Rangel follows up. He had his selective nerve root block at L5. He said he had complete relief of the symptoms when he actually did the selective nerve root block. It felt fantastic and for about 30 hours, he had near complete relief of his pain first time in over a month. It was an L5 selective nerve root block. He now reports the pain has returned in his buttock, thigh, calf, and into the great toe. He has taken Neurontin and the Dilaudid. He is significantly disabled by that he is recovering from apparently a bout of pneumonia. He reports overall being better.  REVIEW OF SYSTEMS Negative. Again he has had multiple trips to the emergency room.   Past Medical History  Diagnosis Date  . Thyroid disease   . Hypothyroidism     Past Surgical History  Procedure Laterality Date  . Cholecystectomy    . Rotator cuff repair    . Knee arthroscopy w/ meniscal repair    . Repair / reinsert biceps tendon at elbow    . Tonsillectomy      Family History  Problem Relation Age of Onset  . Diabetes Mother   . Cancer Father    Social History:  reports that he has never smoked. He uses smokeless tobacco. He reports that he drinks alcohol. He reports that he does not use illicit  drugs.  Allergies: No Known Allergies   (Not in a hospital admission)  No results found for this or any previous visit (from the past 48 hour(s)). No results found.  Review of Systems  Constitutional: Negative.   HENT: Negative.   Eyes: Negative.   Respiratory: Negative.   Cardiovascular: Negative.   Gastrointestinal: Negative.   Genitourinary: Negative.   Musculoskeletal: Positive for back pain.  Skin: Negative.   Neurological: Positive for sensory change and focal weakness.    There were no vitals taken for this visit. Physical Exam  Constitutional: He is oriented to person, place, and time. He appears well-developed. He appears distressed.  HENT:  Head: Normocephalic.  Eyes: Pupils are equal, round, and reactive to light.  Neck: Normal range of motion.  Cardiovascular: Normal rate.   Respiratory: Effort normal.  GI: Soft.  Musculoskeletal:  He is in moderate distress. Mood and affect is appropriate. Walks with antalgic gait. Straight leg raise with buttock and thigh pain, exacerbated with dorsal augmentation maneuver. EHL is still 4+/5 on the right compared to the left. Slight altered sensation in L5 dermatome.  Neurological: He is alert and oriented to person, place, and time.    Again his radiographs AP, lateral, flexion and extension demonstrates disc generation at L5-1. No instability on flexion and extension. He has a transitional vertebrae.  His MRI demonstrates facet arthropathy, disc protrusions and  neuroforaminal stenosis on the right. He has it bilaterally.  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  He has no back pain with complete relief from his pain from the selective nerve root block for 30 hours. I had extensive discussion with Gilbert Rangel concerning his current pathology, relevant anatomy and  treatment options. I do feel his symptoms were consistent with L5 radiculopathy. He has no back pain. He has leg pain. He has EHL weakness and with his neural foraminal stenosis noted on his MRI, I feel it was confirmed by the diagnostic portion of the selective nerve root block and unfortunately, has not given him lasting relief. Given the corelation there, I feel his pain generators is the L5 foramen on the right. We therefore discussed options of living with his symptoms. I do not feel repeat injection will give him any lasting relief. We discussed foraminotomy and discectomy if a fragment is noted at L5. He still has space at L5-S1 at the disc. I do not feel he has pedicle on pedicle compression that would necessitate a concomitant fusion. He has no back pain or instability. I did, however, indicate that there is possibility it may require that in the future TLIF and a fusion. He is at 14. He has trips to the emergency room. Again, he is getting over a recent bout of pneumonia. He has no other cause of his radicular pain There is a minimal scoliosis. Again, he reports no back pain. He continues to remain significantly disabled by this. We discussed foraminotomy, lateral recess decompression.  I had an extensive discussion of the risks and benefits of the lumbar decompression with the patient including bleeding, infection, damage to neurovascular structures, epidural fibrosis, CSF leak requiring repair. We also discussed increase in pain, adjacent segment disease, recurrent disc herniation, need for future surgery including repeat decompression and/or fusion. We also discussed risks of postoperative hematoma, paralysis, anesthetic complications including DVT, PE, death, cardiopulmonary dysfunction. In addition, the perioperative and postoperative courses were discussed in detail including the rehabilitative time and return to functional activity and work. I provided the patient with an illustrated handout and  utilized the appropriate surgical models.  His medical physicians Dr. Woody Seller. We will proceed accordingly once his over his bout of pneumonia. Hopefully, we can get clearance as soon as possible. In the interim, avoid extension favor flexion, knee to chest, analgesics. If there is any change in the interim, he is to call. 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 We discussed the possibility of residual symptoms related to neural compression and nerve root healing.  Plan microlumbar decompression L5-S1 right, possible L4-5  BISSELL, JACLYN M. PA-C for Dr. Tonita Cong 09/23/2015, 2:18 PM

## 2015-09-29 NOTE — Interval H&P Note (Signed)
History and Physical Interval Note:  09/29/2015 7:26 AM  Gilbert Rangel  has presented today for surgery, with the diagnosis of STENOSIS HNP L5-S1 ON RIGHT  The various methods of treatment have been discussed with the patient and family. After consideration of risks, benefits and other options for treatment, the patient has consented to  Procedure(s): MICRO LUMBER DECOMPRESSION L5-S1 ON RIGHT POSSIBLE L4-5 (N/A) as a surgical intervention .  The patient's history has been reviewed, patient examined, no change in status, stable for surgery.  I have reviewed the patient's chart and labs.  Questions were answered to the patient's satisfaction.     Janyra Barillas C

## 2015-09-29 NOTE — Progress Notes (Signed)
Foley Catheter removed per patient request. Pt was very uncomfortable and pleaded with nurse to remove it, so it was removed at 1715. Pt due to void by 1115. Urinal at bedside and instructed to call before ambulating on his own.

## 2015-09-29 NOTE — Anesthesia Procedure Notes (Signed)
Procedure Name: Intubation Date/Time: 09/29/2015 11:14 AM Performed by: Noralyn Pick D Pre-anesthesia Checklist: Patient identified, Emergency Drugs available, Suction available and Patient being monitored Patient Re-evaluated:Patient Re-evaluated prior to inductionOxygen Delivery Method: Circle System Utilized Preoxygenation: Pre-oxygenation with 100% oxygen Intubation Type: IV induction Ventilation: Mask ventilation without difficulty Laryngoscope Size: Mac and 4 Grade View: Grade II Tube type: Oral Tube size: 7.5 mm Number of attempts: 1 Airway Equipment and Method: Stylet and Oral airway Placement Confirmation: ETT inserted through vocal cords under direct vision,  positive ETCO2 and breath sounds checked- equal and bilateral Secured at: 23 cm Tube secured with: Tape Dental Injury: Teeth and Oropharynx as per pre-operative assessment

## 2015-09-29 NOTE — Brief Op Note (Signed)
09/29/2015  12:43 PM  PATIENT:  Gilbert Rangel  69 y.o. male  PRE-OPERATIVE DIAGNOSIS:  STENOSIS HNP L5-S1 ON RIGHT  POST-OPERATIVE DIAGNOSIS:  * No post-op diagnosis entered *  PROCEDURE:  Procedure(s): MICRO LUMBER DECOMPRESSION L5-S1 ON RIGHT POSSIBLE L4-5 (N/A)  SURGEON:  Surgeon(s) and Role:    * Susa Day, MD - Primary  PHYSICIAN ASSISTANT:   ASSISTANTS: Bissell   ANESTHESIA:   general  EBL:  Total I/O In: 1000 [I.V.:1000] Out: 200 [Urine:200]  BLOOD ADMINISTERED:none  DRAINS: none   LOCAL MEDICATIONS USED:  MARCAINE     SPECIMEN:  Source of Specimen:  L5S1  DISPOSITION OF SPECIMEN:  PATHOLOGY  COUNTS:  YES  TOURNIQUET:  * No tourniquets in log *  DICTATION: .Other Dictation: Dictation Number [id number]  PLAN OF CARE: Admit for overnight observation  PATIENT DISPOSITION:  PACU - hemodynamically stable.   Delay start of Pharmacological VTE agent (>24hrs) due to surgical blood loss or risk of bleeding: yes

## 2015-09-29 NOTE — Anesthesia Postprocedure Evaluation (Signed)
Anesthesia Post Note  Patient: Gilbert Rangel  Procedure(s) Performed: Procedure(s) (LRB): MICRO LUMBER DECOMPRESSION L5-S1 AND L4-5 ON RIGHT (N/A)  Patient location during evaluation: PACU Anesthesia Type: General Level of consciousness: awake and alert Pain management: pain level controlled Vital Signs Assessment: post-procedure vital signs reviewed and stable Respiratory status: spontaneous breathing, nonlabored ventilation, respiratory function stable and patient connected to nasal cannula oxygen Cardiovascular status: blood pressure returned to baseline and stable Postop Assessment: No signs of nausea or vomiting Anesthetic complications: no    Last Vitals:  Filed Vitals:   09/29/15 1400 09/29/15 1415  BP: 145/88 129/82  Pulse: 74 74  Temp: 37 C   Resp: 13 12    Last Pain:  Filed Vitals:   09/29/15 1417  PainSc: 2     LLE Motor Response: Purposeful movement LLE Sensation: Full sensation RLE Motor Response: Purposeful movement RLE Sensation: Full sensation      Valentin Benney L

## 2015-09-29 NOTE — Transfer of Care (Signed)
Immediate Anesthesia Transfer of Care Note  Patient: Gilbert Rangel  Procedure(s) Performed: Procedure(s): MICRO LUMBER DECOMPRESSION L5-S1 AND L4-5 ON RIGHT (N/A)  Patient Location: PACU  Anesthesia Type:General  Level of Consciousness: awake, alert  and oriented  Airway & Oxygen Therapy: Patient Spontanous Breathing and Patient connected to face mask oxygen  Post-op Assessment: Report given to RN and Post -op Vital signs reviewed and stable  Post vital signs: Reviewed and stable  Last Vitals:  Filed Vitals:   09/29/15 0834  BP: 135/83  Pulse: 79  Temp: 36.5 C  Resp: 18    Complications: No apparent anesthesia complications

## 2015-09-29 NOTE — Anesthesia Preprocedure Evaluation (Addendum)
Anesthesia Evaluation  Patient identified by MRN, date of birth, ID band Patient awake    Reviewed: Allergy & Precautions, H&P , NPO status , Patient's Chart, lab work & pertinent test results  Airway Mallampati: II  TM Distance: >3 FB Neck ROM: full    Dental no notable dental hx. (+) Dental Advisory Given, Teeth Intact   Pulmonary neg pulmonary ROS, pneumonia, resolved,    Pulmonary exam normal breath sounds clear to auscultation       Cardiovascular Exercise Tolerance: Good negative cardio ROS Normal cardiovascular exam Rhythm:regular Rate:Normal  LAFB   Neuro/Psych History acute encephalopathy last month due to pain and sepsis from pneumonia negative neurological ROS  negative psych ROS   GI/Hepatic negative GI ROS, Neg liver ROS,   Endo/Other  negative endocrine ROSHypothyroidism   Renal/GU negative Renal ROS  negative genitourinary   Musculoskeletal   Abdominal   Peds  Hematology negative hematology ROS (+)   Anesthesia Other Findings   Reproductive/Obstetrics negative OB ROS                            Anesthesia Physical Anesthesia Plan  ASA: II  Anesthesia Plan: General   Post-op Pain Management:    Induction: Intravenous  Airway Management Planned: Oral ETT  Additional Equipment:   Intra-op Plan:   Post-operative Plan: Extubation in OR  Informed Consent: I have reviewed the patients History and Physical, chart, labs and discussed the procedure including the risks, benefits and alternatives for the proposed anesthesia with the patient or authorized representative who has indicated his/her understanding and acceptance.   Dental Advisory Given  Plan Discussed with: CRNA and Surgeon  Anesthesia Plan Comments:         Anesthesia Quick Evaluation

## 2015-09-30 DIAGNOSIS — M5126 Other intervertebral disc displacement, lumbar region: Secondary | ICD-10-CM | POA: Diagnosis not present

## 2015-09-30 NOTE — Progress Notes (Signed)
Subjective: 1 Day Post-Op Procedure(s) (LRB): MICRO LUMBER DECOMPRESSION L5-S1 AND L4-5 ON RIGHT (N/A) Patient reports pain as 3 on 0-10 scale.    Objective: Vital signs in last 24 hours: Temp:  [97.6 F (36.4 C)-99.4 F (37.4 C)] 99.1 F (37.3 C) (11/24 0541) Pulse Rate:  [72-88] 87 (11/24 0541) Resp:  [12-18] 16 (11/24 0541) BP: (115-155)/(65-88) 115/65 mmHg (11/24 0541) SpO2:  [95 %-100 %] 95 % (11/24 0541) Weight:  [92.987 kg (205 lb)] 92.987 kg (205 lb) (11/23 0850)  Intake/Output from previous day: 11/23 0701 - 11/24 0700 In: 4024.2 [P.O.:1200; I.V.:2669.2; IV Piggyback:155] Out: [id number] [Urine:1825] Intake/Output this shift:    No results for input(s): HGB in the last 72 hours. No results for input(s): WBC, RBC, HCT, PLT in the last 72 hours. No results for input(s): NA, K, CL, CO2, BUN, CREATININE, GLUCOSE, CALCIUM in the last 72 hours. No results for input(s): LABPT, INR in the last 72 hours.  Neurologically intact Neurovascular intact Sensation intact distally Intact pulses distally No cellulitis present Compartment soft  Assessment/Plan: 1 Day Post-Op Procedure(s) (LRB): MICRO LUMBER DECOMPRESSION L5-S1 AND L4-5 ON RIGHT (N/A) Advance diet Up with therapy Discharge home with home health  Killian Schwer C 09/30/2015, 7:33 AM

## 2015-09-30 NOTE — Progress Notes (Signed)
Physical Therapy Treatment Patient Details Name: Gilbert Rangel MRN: [id number] DOB: 01-30-1946 Today's Date: 09/30/2015    History of Present Illness Spinal stenosis, herniated nucleus pulposus, S/P MICRO LUMBER DECOMPRESSION L5-S1 ON RIGHT POSSIBLE L4-5 (N/A)    PT Comments    Pt is progressing well with mobility, he walked 300' x 2 (once without assistive device, once holding IV pole). Stair training completed. Pt has moderate R foot drop. Instructed pt in gastroc/hamstring stretch and ankle pumps for HEP. Extensively reviewed back precautions with pt and son. 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    Follow Up Recommendations  No PT follow up     Equipment Recommendations  Cane    Recommendations for Other Services       Precautions / Restrictions Precautions Precautions: Back Precaution Booklet Issued: Yes (comment) Precaution Comments: reviewed precaution in detail with pt and son, handout given Restrictions Weight Bearing Restrictions: No    Mobility  Bed Mobility               General bed mobility comments: NT- up on EOB, reviewed log roll technique verbally  Transfers Overall transfer level: Modified independent Equipment used: Rolling walker (2 wheeled);None   Sit to Stand: Modified independent (Device/Increase time)         General transfer comment: sit to stand x 4, 1x with RW, 3x without AD, no physical assist required, verbal cues for hand placement initially  Ambulation/Gait   Ambulation Distance (Feet): 600 Feet ((300' x 2)) Assistive device: None (holding IV pole)  with supervision    Gait velocity interpretation: at or above normal speed for age/gender General Gait Details: 200' without AD, then 300' holding IV pole with RUE (pt reported feeling  more steady with this), now recommending cane, no LOB, encouraged heel strike on R, pt has moderate foot drop   Stairs Stairs: Yes Stairs assistance: Supervision Stair Management: No rails;Alternating pattern Number of Stairs: 4 General stair comments: steady  Wheelchair Mobility    Modified Rankin (Stroke Patients Only)       Balance                                    Cognition Arousal/Alertness: Awake/alert Behavior During Therapy: WFL for tasks assessed/performed Overall Cognitive Status: Within Functional Limits for tasks assessed                      Exercises General Exercises - Lower Extremity Ankle Circles/Pumps: AROM;AAROM;20 reps;Right;Seated Other Exercises Other Exercises: R gastroc/hamstring stretch using sheet on ball of foot, 30 sec hold x 3, in long sitting    General Comments        Pertinent Vitals/Pain Pain Score: 1  Pain Location: incision site; pins and needles in R dorsum of foot Pain Descriptors / Indicators: Sore Pain Intervention(s): Monitored during session;Limited activity within patient's tolerance    Home Living                      Prior Function            PT Goals (current goals can now be found in the care plan section) Acute Rehab PT Goals Patient Stated  Goal: return to work ASAP PT Goal Formulation: With patient/family Time For Goal Achievement: 10/01/15 Potential to Achieve Goals: Good Progress towards PT goals: Progressing toward goals    Frequency  Min 5X/week    PT Plan Current plan remains appropriate    Co-evaluation             End of Session Equipment Utilized During Treatment: Gait belt Activity Tolerance: Patient tolerated treatment well Patient left: in chair;with call bell/phone within reach;with family/visitor present     Time: 0803-0856 PT Time Calculation (min) (ACUTE ONLY): 53 min  Charges:  $Gait Training: 23-37 mins $Therapeutic Exercise: 8-22  mins $Therapeutic Activity: 8-22 mins                    G Codes:  Functional Assessment Tool Used: clinical judgement Functional Limitation: Mobility: Walking and moving around Mobility: Walking and Moving Around Current Status [id number]): At least 1 percent but less than 20 percent impaired, limited or restricted Mobility: Walking and Moving Around Discharge Status [phone]): At least 1 percent but less than 20 percent impaired, limited or restricted   Philomena Doheny 09/30/2015, 9:11 AM [phone]

## 2015-09-30 NOTE — Evaluation (Signed)
Occupational Therapy Evaluation Patient Details Name: Gilbert Rangel MRN: [id number] DOB: December 24, 1945 Today's Date: 09/30/2015    History of Present Illness Spinal stenosis, herniated nucleus pulposus, S/P MICRO LUMBER DECOMPRESSION L5-S1 ON RIGHT and L4-5    Clinical Impression   Pt was admitted for the above surgery.  All education was completed.  Recommend intermittent supervision. Performed dressing, toilet and simulated tub transfer.  Pt verbalizes understanding of all education; son present and also verbalizes.    Follow Up Recommendations  No OT follow up;Supervision - Intermittent    Equipment Recommendations   (pt does not want tub bench; provided resource on toilet aide)    Recommendations for Other Services       Precautions / Restrictions Precautions Precautions: Back Precaution Booklet Issued: Yes (comment) Precaution Comments: reviewed precaution in detail with pt and son, handout given Restrictions Weight Bearing Restrictions: No      Mobility Bed Mobility               General bed mobility comments: oob  Transfers Overall transfer level: Modified independent Equipment used: None   Sit to Stand: Supervision         General transfer comment: cues for back precautions, powering up with legs    Balance                                            ADL Overall ADL's : Needs assistance/impaired     Grooming: Supervision/safety;Set up;Sitting   Upper Body Bathing: Set up;Supervision/ safety;Sitting   Lower Body Bathing: Supervison/ safety;Set up;Sit to/from stand   Upper Body Dressing : Set up;Supervision/safety;Sitting   Lower Body Dressing: Set up;Supervision/safety;Sit to/from stand   Toilet Transfer: Min guard;Ambulation;Comfort height toilet;Grab bars   Toileting- Clothing Manipulation and Hygiene: Minimal assistance;Sit to/from stand   Tub/ Shower Transfer: Tub transfer;Minimal assistance;Ambulation      General ADL Comments: pt dressed himself with supervision/cues.  He is able to cross legs comfortably while sitting: reviewed all options.  When he put jeans on, incision bothered him, so he undressed and dressed again with shorts.  Did not button top button and put belt on as loose as possible.  Pt does not want toilet DME--plans to get comfort height commode.  Educated on sitting backwards, if he needs support to get up.  Practiced simulated tub transfer at end of session.  Pt has RLE weakness.  Pt became really tired after practicing and sat down.  Educated on tub transfer bench for increased safety.  Pt doesn't really want this.  Son will be available to assist him.  Recommended he sponge bathe initially to regain strength     Vision     Perception     Praxis      Pertinent Vitals/Pain Pain Assessment: 0-10 Pain Score: 2  Pain Location: back incision Pain Descriptors / Indicators: Sore Pain Intervention(s): Limited activity within patient's tolerance;Monitored during session;Premedicated before session;Repositioned     Hand Dominance     Extremity/Trunk Assessment Upper Extremity Assessment Upper Extremity Assessment: Overall WFL for tasks assessed           Communication Communication Communication: No difficulties   Cognition Arousal/Alertness: Awake/alert Behavior During Therapy: WFL for tasks assessed/performed Overall Cognitive Status: Within Functional Limits for tasks assessed  General Comments       Exercises      Shoulder Instructions      Home Living Family/patient expects to be discharged to:: Private residence Living Arrangements: Alone;Children Available Help at Discharge: Family               Bathroom Shower/Tub: Tub/shower unit Shower/tub characteristics: Architectural technologist: Standard         Additional Comments: pt plans to have comfort height commode installed tomorrow      Prior  Functioning/Environment Level of Independence: Independent             OT Diagnosis: Generalized weakness   OT Problem List:     OT Treatment/Interventions:      OT Goals(Current goals can be found in the care plan section) Acute Rehab OT Goals Patient Stated Goal: return to work ASAP  OT Frequency:     Barriers to D/C:            Co-evaluation              End of Session    Activity Tolerance: Patient tolerated treatment well Patient left: in chair;with call bell/phone within reach;with family/visitor present   Time: 0912-1008 OT Time Calculation (min): 56 min Charges:  OT General Charges $OT Visit: 1 Procedure OT Evaluation $Initial OT Evaluation Tier I: 1 Procedure OT Treatments $Self Care/Home Management : 23-37 mins G-Codes: OT G-codes **NOT FOR INPATIENT CLASS** Functional Assessment Tool Used: clinical observation Functional Limitation: Self care Self Care Current Status [id number]): At least 1 percent but less than 20 percent impaired, limited or restricted Self Care Goal Status [id number]): At least 1 percent but less than 20 percent impaired, limited or restricted Self Care Discharge Status [phone]): At least 1 percent but less than 20 percent impaired, limited or restricted  [facility] 09/30/2015, 10:33 AM   Lesle Chris, OTR/L [phone] 09/30/2015

## 2015-10-04 NOTE — Discharge Summary (Signed)
Physician Discharge Summary   Patient ID: Gilbert Rangel MRN: [id number] DOB/AGE: [birth year] 69 y.o.  Admit date: 09/29/2015 Discharge date: 10/04/2015  Primary Diagnosis:   STENOSIS HNP L5-S1 ON RIGHT  Admission Diagnoses:  Past Medical History  Diagnosis Date  . Thyroid disease   . Hypothyroidism    Discharge Diagnoses:   Principal Problem:   HNP (herniated nucleus pulposus), lumbar Active Problems:   Spinal stenosis of lumbar region  Procedure:  Procedure(s) (LRB): MICRO LUMBER DECOMPRESSION L5-S1 AND L4-5 ON RIGHT (N/A)   Consults: None  HPI:  see H&P    Laboratory Data: Hospital Outpatient Visit on 09/17/2015  Component Date Value Ref Range Status  . Sodium 09/17/2015 137  135 - 145 mmol/L Final  . Potassium 09/17/2015 4.2  3.5 - 5.1 mmol/L Final  . Chloride 09/17/2015 102  101 - 111 mmol/L Final  . CO2 09/17/2015 26  22 - 32 mmol/L Final  . Glucose, Bld 09/17/2015 108* 65 - 99 mg/dL Final  . BUN 09/17/2015 12  6 - 20 mg/dL Final  . Creatinine, Ser 09/17/2015 1.19  0.61 - 1.24 mg/dL Final  . Calcium 09/17/2015 9.0  8.9 - 10.3 mg/dL Final  . GFR calc non Af Amer 09/17/2015 >60  >60 mL/min Final  . GFR calc Af Amer 09/17/2015 >60  >60 mL/min Final   Comment: (NOTE) The eGFR has been calculated using the CKD EPI equation. This calculation has not been validated in all clinical situations. eGFR's persistently <60 mL/min signify possible Chronic Kidney Disease.   . Anion gap 09/17/2015 9  5 - 15 Final  . WBC 09/17/2015 6.9  4.0 - 10.5 K/uL Final  . RBC 09/17/2015 4.61  4.22 - 5.81 MIL/uL Final  . Hemoglobin 09/17/2015 14.6  13.0 - 17.0 g/dL Final  . HCT 09/17/2015 42.4  39.0 - 52.0 % Final  . MCV 09/17/2015 92.0  78.0 - 100.0 fL Final  . MCH 09/17/2015 31.7  26.0 - 34.0 pg Final  . MCHC 09/17/2015 34.4  30.0 - 36.0 g/dL Final  . RDW 09/17/2015 13.5  11.5 - 15.5 % Final  . Platelets 09/17/2015 265  150 - 400 K/uL Final  . MRSA, PCR 09/17/2015 NEGATIVE   NEGATIVE Final  . Staphylococcus aureus 09/17/2015 NEGATIVE  NEGATIVE Final   Comment:        The Xpert SA Assay (FDA approved for NASAL specimens in patients over 57 years of age), is one component of a comprehensive surveillance program.  Test performance has been validated by [facility] for patients greater than or equal to 32 year old. It is not intended to diagnose infection nor to guide or monitor treatment.    No results for input(s): HGB in the last 72 hours. No results for input(s): WBC, RBC, HCT, PLT in the last 72 hours. No results for input(s): NA, K, CL, CO2, BUN, CREATININE, GLUCOSE, CALCIUM in the last 72 hours. No results for input(s): LABPT, INR in the last 72 hours.  X-Rays:Dg Chest 2 View  09/17/2015  CLINICAL DATA:  Preop for back surgery.  Recent pneumonia. EXAM: CHEST  2 VIEW COMPARISON:  08/28/2015 FINDINGS: The opacity noted at the left lung base on the prior study is no longer evident. Lungs are now clear. No pleural effusion or pneumothorax. Normal heart, mediastinum and hila. Bony thorax is intact. IMPRESSION: No active cardiopulmonary disease. Electronically Signed   By: Lajean Manes M.D.   On: 09/17/2015 13:17   Dg Lumbar Spine 2-3 Views  09/17/2015  CLINICAL DATA:  Low back pain. EXAM: LUMBAR SPINE - 2-3 VIEW COMPARISON:  MRI 08/16/2015 . FINDINGS: Lumbar vertebra numbered with the lowest ribbed vertebra as T12. Mild scoliosis concave left. No acute bony abnormality identified. Surgical clips right upper quadrant. Aortoiliac atherosclerotic vascular disease. IMPRESSION: 1. Diffuse degenerative changes lumbar spinal scoliosis concave left. No acute abnormality. 2.  Aortoiliac atherosclerotic vascular disease. Electronically Signed   By: Marcello Moores  Register   On: 09/17/2015 12:05   Dg Spine Portable 1 View  09/29/2015  CLINICAL DATA:  Intraoperative localization EXAM: PORTABLE SPINE - 1 VIEW COMPARISON:  Film from earlier in the same day FINDINGS: Surgical  instruments are again seen at the L5-S1 interspace just posterior to the disc space. The numbering nomenclature is similar to that utilized on previously images IMPRESSION: Surgical instruments at the L5-S1 interspace. Electronically Signed   By: Inez Catalina M.D.   On: 09/29/2015 12:58   Dg Spine Portable 1 View  09/29/2015  CLINICAL DATA:  Surgical level L5-S1 EXAM: PORTABLE SPINE - 1 VIEW COMPARISON:  09/29/2015 FINDINGS: Transitional anatomy at the lumbosacral junction. With consistent numbering compared to prior studies, the posterior surgical instruments are directed toward the S1 vertebral body. IMPRESSION: Intraoperative localization as above. Electronically Signed   By: Rolm Baptise M.D.   On: 09/29/2015 12:21   Dg Spine Portable 1 View  09/29/2015  CLINICAL DATA:  Surgical level L5-S1. EXAM: PORTABLE SPINE - 1 VIEW COMPARISON:  09/17/2015. FINDINGS: A single intraoperative cross-table lateral view of the lumbar spine is submitted. Numbering system utilized on 09/17/2015 is preserved. Surgical instrument tips are seen in the soft tissues posterior to the L4-5 interspinous region and inferior aspect of the L5 spinous process. IMPRESSION: Intraoperative localization as above. Electronically Signed   By: Lorin Picket M.D.   On: 09/29/2015 11:56    EKG: Orders placed or performed during the hospital encounter of 08/27/15  . ED EKG  . ED EKG  . EKG 12-Lead  . EKG 12-Lead  . EKG     Hospital Course: Patient was admitted to [facility] and taken to the OR and underwent the above state procedure without complications.  Patient tolerated the procedure well and was later transferred to the recovery room and then to the orthopaedic floor for postoperative care.  They were given PO and IV analgesics for pain control following their surgery.  They were given 24 hours of postoperative antibiotics.   PT was consulted postop to assist with mobility and transfers.  The patient was allowed to  be WBAT with therapy and was taught back precautions. Discharge planning was consulted to help with postop disposition and equipment needs.  Patient had a fair night on the evening of surgery and started to get up OOB with therapy on day one. Patient was seen in rounds and was ready to go home on day one.  They were given discharge instructions and dressing directions.  They were instructed on when to follow up in the office with Dr. Tonita Cong.   Diet: Regular diet Activity:WBAT; Lspine precautions Follow-up:in 10-14 days Disposition - Home Discharged Condition: good   Discharge Instructions    Call MD / Call 911    Complete by:  As directed   If you experience chest pain or shortness of breath, CALL 911 and be transported to the hospital emergency room.  If you develope a fever above 101 F, pus (white drainage) or increased drainage or redness at the wound, or calf  pain, call your surgeon's office.     Constipation Prevention    Complete by:  As directed   Drink plenty of fluids.  Prune juice may be helpful.  You may use a stool softener, such as Colace (over the counter) 100 mg twice a day.  Use MiraLax (over the counter) for constipation as needed.     Diet - low sodium heart healthy    Complete by:  As directed      Increase activity slowly as tolerated    Complete by:  As directed             Medication List    TAKE these medications        docusate sodium 100 MG capsule  Commonly known as:  COLACE  Take 1 capsule (100 mg total) by mouth 2 (two) times daily as needed for mild constipation.     gabapentin 300 MG capsule  Commonly known as:  NEURONTIN  Take 1 capsule (300 mg total) by mouth 2 (two) times daily.     HYDROmorphone 4 MG tablet  Commonly known as:  DILAUDID  Take 0.5 tablets (2 mg total) by mouth every 4 (four) hours as needed for severe pain.     levothyroxine 75 MCG tablet  Commonly known as:  SYNTHROID, LEVOTHROID  Take 75 mcg by mouth daily before breakfast.      nicotine 21 mg/24hr patch  Commonly known as:  NICODERM CQ - dosed in mg/24 hours  Place 1 patch (21 mg total) onto the skin daily.           Follow-up Information    Follow up with BEANE,JEFFREY C, MD In 2 weeks.   Specialty:  Orthopedic Surgery   Contact information:   [address] [place] [address] [phone]       Signed: Lacie Draft, PA-C Orthopaedic Surgery 10/04/2015, 8:25 AM

## 2015-11-17 DIAGNOSIS — Z4789 Encounter for other orthopedic aftercare: Secondary | ICD-10-CM | POA: Diagnosis not present

## 2015-11-22 DIAGNOSIS — Z7189 Other specified counseling: Secondary | ICD-10-CM | POA: Diagnosis not present

## 2015-11-22 DIAGNOSIS — M109 Gout, unspecified: Secondary | ICD-10-CM | POA: Diagnosis not present

## 2015-11-22 DIAGNOSIS — E291 Testicular hypofunction: Secondary | ICD-10-CM | POA: Diagnosis not present

## 2015-11-22 DIAGNOSIS — Z1389 Encounter for screening for other disorder: Secondary | ICD-10-CM | POA: Diagnosis not present

## 2015-11-22 DIAGNOSIS — E039 Hypothyroidism, unspecified: Secondary | ICD-10-CM | POA: Diagnosis not present

## 2015-11-22 DIAGNOSIS — Z125 Encounter for screening for malignant neoplasm of prostate: Secondary | ICD-10-CM | POA: Diagnosis not present

## 2015-11-22 DIAGNOSIS — Z1211 Encounter for screening for malignant neoplasm of colon: Secondary | ICD-10-CM | POA: Diagnosis not present

## 2015-11-22 DIAGNOSIS — Z6827 Body mass index (BMI) 27.0-27.9, adult: Secondary | ICD-10-CM | POA: Diagnosis not present

## 2015-11-22 DIAGNOSIS — Z0000 Encounter for general adult medical examination without abnormal findings: Secondary | ICD-10-CM | POA: Diagnosis not present

## 2015-11-22 DIAGNOSIS — Z418 Encounter for other procedures for purposes other than remedying health state: Secondary | ICD-10-CM | POA: Diagnosis not present

## 2015-11-22 DIAGNOSIS — Z79899 Other long term (current) drug therapy: Secondary | ICD-10-CM | POA: Diagnosis not present

## 2015-11-26 DIAGNOSIS — E291 Testicular hypofunction: Secondary | ICD-10-CM | POA: Diagnosis not present

## 2015-12-03 DIAGNOSIS — E291 Testicular hypofunction: Secondary | ICD-10-CM | POA: Diagnosis not present

## 2015-12-10 DIAGNOSIS — E291 Testicular hypofunction: Secondary | ICD-10-CM | POA: Diagnosis not present

## 2016-01-24 DIAGNOSIS — H40053 Ocular hypertension, bilateral: Secondary | ICD-10-CM | POA: Diagnosis not present

## 2016-01-24 DIAGNOSIS — H25013 Cortical age-related cataract, bilateral: Secondary | ICD-10-CM | POA: Diagnosis not present

## 2016-01-24 DIAGNOSIS — H2513 Age-related nuclear cataract, bilateral: Secondary | ICD-10-CM | POA: Diagnosis not present

## 2016-02-02 DIAGNOSIS — E291 Testicular hypofunction: Secondary | ICD-10-CM | POA: Diagnosis not present

## 2016-02-09 DIAGNOSIS — E291 Testicular hypofunction: Secondary | ICD-10-CM | POA: Diagnosis not present

## 2016-02-09 DIAGNOSIS — H2511 Age-related nuclear cataract, right eye: Secondary | ICD-10-CM | POA: Diagnosis not present

## 2016-02-16 DIAGNOSIS — H2511 Age-related nuclear cataract, right eye: Secondary | ICD-10-CM | POA: Diagnosis not present

## 2016-02-29 DIAGNOSIS — Z961 Presence of intraocular lens: Secondary | ICD-10-CM | POA: Diagnosis not present

## 2016-02-29 DIAGNOSIS — H2512 Age-related nuclear cataract, left eye: Secondary | ICD-10-CM | POA: Diagnosis not present

## 2016-02-29 DIAGNOSIS — H4720 Unspecified optic atrophy: Secondary | ICD-10-CM | POA: Diagnosis not present

## 2016-02-29 DIAGNOSIS — H35361 Drusen (degenerative) of macula, right eye: Secondary | ICD-10-CM | POA: Diagnosis not present

## 2016-04-10 DIAGNOSIS — H903 Sensorineural hearing loss, bilateral: Secondary | ICD-10-CM | POA: Diagnosis not present

## 2016-04-11 DIAGNOSIS — M9901 Segmental and somatic dysfunction of cervical region: Secondary | ICD-10-CM | POA: Diagnosis not present

## 2016-04-11 DIAGNOSIS — M5412 Radiculopathy, cervical region: Secondary | ICD-10-CM | POA: Diagnosis not present

## 2016-04-19 DIAGNOSIS — T162XXA Foreign body in left ear, initial encounter: Secondary | ICD-10-CM | POA: Diagnosis not present

## 2016-04-24 DIAGNOSIS — H00029 Hordeolum internum unspecified eye, unspecified eyelid: Secondary | ICD-10-CM | POA: Diagnosis not present

## 2016-04-24 DIAGNOSIS — H2512 Age-related nuclear cataract, left eye: Secondary | ICD-10-CM | POA: Diagnosis not present

## 2016-04-24 DIAGNOSIS — Z961 Presence of intraocular lens: Secondary | ICD-10-CM | POA: Diagnosis not present

## 2016-04-29 DIAGNOSIS — H01009 Unspecified blepharitis unspecified eye, unspecified eyelid: Secondary | ICD-10-CM | POA: Diagnosis not present

## 2016-04-29 DIAGNOSIS — H16221 Keratoconjunctivitis sicca, not specified as Sjogren's, right eye: Secondary | ICD-10-CM | POA: Diagnosis not present

## 2016-05-01 DIAGNOSIS — N400 Enlarged prostate without lower urinary tract symptoms: Secondary | ICD-10-CM | POA: Diagnosis not present

## 2016-05-01 DIAGNOSIS — E291 Testicular hypofunction: Secondary | ICD-10-CM | POA: Diagnosis not present

## 2016-05-01 DIAGNOSIS — Z299 Encounter for prophylactic measures, unspecified: Secondary | ICD-10-CM | POA: Diagnosis not present

## 2016-05-01 DIAGNOSIS — M109 Gout, unspecified: Secondary | ICD-10-CM | POA: Diagnosis not present

## 2016-05-10 DIAGNOSIS — E291 Testicular hypofunction: Secondary | ICD-10-CM | POA: Diagnosis not present

## 2016-05-17 DIAGNOSIS — E291 Testicular hypofunction: Secondary | ICD-10-CM | POA: Diagnosis not present

## 2016-07-03 DIAGNOSIS — H01009 Unspecified blepharitis unspecified eye, unspecified eyelid: Secondary | ICD-10-CM | POA: Diagnosis not present

## 2016-07-03 DIAGNOSIS — H35371 Puckering of macula, right eye: Secondary | ICD-10-CM | POA: Diagnosis not present

## 2016-07-03 DIAGNOSIS — Z961 Presence of intraocular lens: Secondary | ICD-10-CM | POA: Diagnosis not present

## 2016-07-11 DIAGNOSIS — M5412 Radiculopathy, cervical region: Secondary | ICD-10-CM | POA: Diagnosis not present

## 2016-07-11 DIAGNOSIS — M9901 Segmental and somatic dysfunction of cervical region: Secondary | ICD-10-CM | POA: Diagnosis not present

## 2016-07-24 DIAGNOSIS — H01009 Unspecified blepharitis unspecified eye, unspecified eyelid: Secondary | ICD-10-CM | POA: Diagnosis not present

## 2016-07-24 DIAGNOSIS — H35371 Puckering of macula, right eye: Secondary | ICD-10-CM | POA: Diagnosis not present

## 2016-07-24 DIAGNOSIS — Z961 Presence of intraocular lens: Secondary | ICD-10-CM | POA: Diagnosis not present

## 2016-08-17 DIAGNOSIS — M5412 Radiculopathy, cervical region: Secondary | ICD-10-CM | POA: Diagnosis not present

## 2016-08-17 DIAGNOSIS — M9901 Segmental and somatic dysfunction of cervical region: Secondary | ICD-10-CM | POA: Diagnosis not present

## 2016-09-12 DIAGNOSIS — M9901 Segmental and somatic dysfunction of cervical region: Secondary | ICD-10-CM | POA: Diagnosis not present

## 2016-09-12 DIAGNOSIS — M5412 Radiculopathy, cervical region: Secondary | ICD-10-CM | POA: Diagnosis not present

## 2016-10-09 DIAGNOSIS — H26491 Other secondary cataract, right eye: Secondary | ICD-10-CM | POA: Diagnosis not present

## 2016-10-09 DIAGNOSIS — H01009 Unspecified blepharitis unspecified eye, unspecified eyelid: Secondary | ICD-10-CM | POA: Diagnosis not present

## 2016-10-09 DIAGNOSIS — H35371 Puckering of macula, right eye: Secondary | ICD-10-CM | POA: Diagnosis not present

## 2016-10-19 DIAGNOSIS — M9901 Segmental and somatic dysfunction of cervical region: Secondary | ICD-10-CM | POA: Diagnosis not present

## 2016-10-19 DIAGNOSIS — M5412 Radiculopathy, cervical region: Secondary | ICD-10-CM | POA: Diagnosis not present

## 2016-10-23 DIAGNOSIS — H35371 Puckering of macula, right eye: Secondary | ICD-10-CM | POA: Diagnosis not present

## 2016-10-23 DIAGNOSIS — H01009 Unspecified blepharitis unspecified eye, unspecified eyelid: Secondary | ICD-10-CM | POA: Diagnosis not present

## 2016-11-28 DIAGNOSIS — M1990 Unspecified osteoarthritis, unspecified site: Secondary | ICD-10-CM | POA: Diagnosis not present

## 2016-11-28 DIAGNOSIS — M79671 Pain in right foot: Secondary | ICD-10-CM | POA: Diagnosis not present

## 2016-11-29 DIAGNOSIS — H16223 Keratoconjunctivitis sicca, not specified as Sjogren's, bilateral: Secondary | ICD-10-CM | POA: Diagnosis not present

## 2016-11-29 DIAGNOSIS — H01009 Unspecified blepharitis unspecified eye, unspecified eyelid: Secondary | ICD-10-CM | POA: Diagnosis not present

## 2016-12-20 DIAGNOSIS — M79671 Pain in right foot: Secondary | ICD-10-CM | POA: Diagnosis not present

## 2016-12-20 DIAGNOSIS — M10071 Idiopathic gout, right ankle and foot: Secondary | ICD-10-CM | POA: Diagnosis not present

## 2016-12-28 DIAGNOSIS — K219 Gastro-esophageal reflux disease without esophagitis: Secondary | ICD-10-CM | POA: Diagnosis not present

## 2016-12-28 DIAGNOSIS — Z6833 Body mass index (BMI) 33.0-33.9, adult: Secondary | ICD-10-CM | POA: Diagnosis not present

## 2016-12-28 DIAGNOSIS — Z1211 Encounter for screening for malignant neoplasm of colon: Secondary | ICD-10-CM | POA: Diagnosis not present

## 2016-12-28 DIAGNOSIS — E349 Endocrine disorder, unspecified: Secondary | ICD-10-CM | POA: Diagnosis not present

## 2016-12-28 DIAGNOSIS — Z7189 Other specified counseling: Secondary | ICD-10-CM | POA: Diagnosis not present

## 2016-12-28 DIAGNOSIS — Z299 Encounter for prophylactic measures, unspecified: Secondary | ICD-10-CM | POA: Diagnosis not present

## 2016-12-28 DIAGNOSIS — E7800 Pure hypercholesterolemia, unspecified: Secondary | ICD-10-CM | POA: Diagnosis not present

## 2016-12-28 DIAGNOSIS — Z1389 Encounter for screening for other disorder: Secondary | ICD-10-CM | POA: Diagnosis not present

## 2016-12-28 DIAGNOSIS — M5417 Radiculopathy, lumbosacral region: Secondary | ICD-10-CM | POA: Diagnosis not present

## 2016-12-28 DIAGNOSIS — M109 Gout, unspecified: Secondary | ICD-10-CM | POA: Diagnosis not present

## 2016-12-28 DIAGNOSIS — Z0000 Encounter for general adult medical examination without abnormal findings: Secondary | ICD-10-CM | POA: Diagnosis not present

## 2016-12-28 DIAGNOSIS — E039 Hypothyroidism, unspecified: Secondary | ICD-10-CM | POA: Diagnosis not present

## 2016-12-29 DIAGNOSIS — E039 Hypothyroidism, unspecified: Secondary | ICD-10-CM | POA: Diagnosis not present

## 2016-12-29 DIAGNOSIS — Z125 Encounter for screening for malignant neoplasm of prostate: Secondary | ICD-10-CM | POA: Diagnosis not present

## 2016-12-29 DIAGNOSIS — E7800 Pure hypercholesterolemia, unspecified: Secondary | ICD-10-CM | POA: Diagnosis not present

## 2016-12-29 DIAGNOSIS — M109 Gout, unspecified: Secondary | ICD-10-CM | POA: Diagnosis not present

## 2016-12-29 DIAGNOSIS — E349 Endocrine disorder, unspecified: Secondary | ICD-10-CM | POA: Diagnosis not present

## 2016-12-29 DIAGNOSIS — Z79899 Other long term (current) drug therapy: Secondary | ICD-10-CM | POA: Diagnosis not present

## 2017-01-04 DIAGNOSIS — G5750 Tarsal tunnel syndrome, unspecified lower limb: Secondary | ICD-10-CM | POA: Diagnosis not present

## 2017-01-04 DIAGNOSIS — M79671 Pain in right foot: Secondary | ICD-10-CM | POA: Diagnosis not present

## 2017-01-04 DIAGNOSIS — M1990 Unspecified osteoarthritis, unspecified site: Secondary | ICD-10-CM | POA: Diagnosis not present

## 2017-01-04 DIAGNOSIS — M79672 Pain in left foot: Secondary | ICD-10-CM | POA: Diagnosis not present

## 2017-01-04 DIAGNOSIS — E291 Testicular hypofunction: Secondary | ICD-10-CM | POA: Diagnosis not present

## 2017-01-12 DIAGNOSIS — E349 Endocrine disorder, unspecified: Secondary | ICD-10-CM | POA: Diagnosis not present

## 2017-01-19 DIAGNOSIS — E349 Endocrine disorder, unspecified: Secondary | ICD-10-CM | POA: Diagnosis not present

## 2017-01-25 DIAGNOSIS — A5216 Charcot's arthropathy (tabetic): Secondary | ICD-10-CM | POA: Diagnosis not present

## 2017-01-25 DIAGNOSIS — M79671 Pain in right foot: Secondary | ICD-10-CM | POA: Diagnosis not present

## 2017-01-31 DIAGNOSIS — H16223 Keratoconjunctivitis sicca, not specified as Sjogren's, bilateral: Secondary | ICD-10-CM | POA: Diagnosis not present

## 2017-01-31 DIAGNOSIS — H01009 Unspecified blepharitis unspecified eye, unspecified eyelid: Secondary | ICD-10-CM | POA: Diagnosis not present

## 2017-02-03 DIAGNOSIS — H16223 Keratoconjunctivitis sicca, not specified as Sjogren's, bilateral: Secondary | ICD-10-CM | POA: Diagnosis not present

## 2017-02-09 DIAGNOSIS — E349 Endocrine disorder, unspecified: Secondary | ICD-10-CM | POA: Diagnosis not present

## 2017-02-16 DIAGNOSIS — E349 Endocrine disorder, unspecified: Secondary | ICD-10-CM | POA: Diagnosis not present

## 2017-02-21 DIAGNOSIS — M5412 Radiculopathy, cervical region: Secondary | ICD-10-CM | POA: Diagnosis not present

## 2017-02-21 DIAGNOSIS — M9901 Segmental and somatic dysfunction of cervical region: Secondary | ICD-10-CM | POA: Diagnosis not present

## 2017-02-26 DIAGNOSIS — E349 Endocrine disorder, unspecified: Secondary | ICD-10-CM | POA: Diagnosis not present

## 2017-03-01 DIAGNOSIS — M1990 Unspecified osteoarthritis, unspecified site: Secondary | ICD-10-CM | POA: Diagnosis not present

## 2017-03-01 DIAGNOSIS — G600 Hereditary motor and sensory neuropathy: Secondary | ICD-10-CM | POA: Diagnosis not present

## 2017-03-01 DIAGNOSIS — M79671 Pain in right foot: Secondary | ICD-10-CM | POA: Diagnosis not present

## 2017-03-05 DIAGNOSIS — E349 Endocrine disorder, unspecified: Secondary | ICD-10-CM | POA: Diagnosis not present

## 2017-03-08 DIAGNOSIS — J329 Chronic sinusitis, unspecified: Secondary | ICD-10-CM | POA: Diagnosis not present

## 2017-03-08 DIAGNOSIS — K219 Gastro-esophageal reflux disease without esophagitis: Secondary | ICD-10-CM | POA: Diagnosis not present

## 2017-03-08 DIAGNOSIS — Z299 Encounter for prophylactic measures, unspecified: Secondary | ICD-10-CM | POA: Diagnosis not present

## 2017-03-08 DIAGNOSIS — Z6832 Body mass index (BMI) 32.0-32.9, adult: Secondary | ICD-10-CM | POA: Diagnosis not present

## 2017-03-08 DIAGNOSIS — Z789 Other specified health status: Secondary | ICD-10-CM | POA: Diagnosis not present

## 2017-03-12 DIAGNOSIS — E349 Endocrine disorder, unspecified: Secondary | ICD-10-CM | POA: Diagnosis not present

## 2017-03-22 DIAGNOSIS — M79672 Pain in left foot: Secondary | ICD-10-CM | POA: Diagnosis not present

## 2017-03-22 DIAGNOSIS — M1990 Unspecified osteoarthritis, unspecified site: Secondary | ICD-10-CM | POA: Diagnosis not present

## 2017-03-22 DIAGNOSIS — M79671 Pain in right foot: Secondary | ICD-10-CM | POA: Diagnosis not present

## 2017-03-22 DIAGNOSIS — G600 Hereditary motor and sensory neuropathy: Secondary | ICD-10-CM | POA: Diagnosis not present

## 2017-03-22 DIAGNOSIS — E349 Endocrine disorder, unspecified: Secondary | ICD-10-CM | POA: Diagnosis not present

## 2017-03-26 DIAGNOSIS — H16223 Keratoconjunctivitis sicca, not specified as Sjogren's, bilateral: Secondary | ICD-10-CM | POA: Diagnosis not present

## 2017-03-29 DIAGNOSIS — G319 Degenerative disease of nervous system, unspecified: Secondary | ICD-10-CM | POA: Diagnosis not present

## 2017-03-29 DIAGNOSIS — H47019 Ischemic optic neuropathy, unspecified eye: Secondary | ICD-10-CM | POA: Diagnosis not present

## 2017-03-29 DIAGNOSIS — H469 Unspecified optic neuritis: Secondary | ICD-10-CM | POA: Diagnosis not present

## 2017-03-29 DIAGNOSIS — H5461 Unqualified visual loss, right eye, normal vision left eye: Secondary | ICD-10-CM | POA: Diagnosis not present

## 2017-03-29 DIAGNOSIS — R220 Localized swelling, mass and lump, head: Secondary | ICD-10-CM | POA: Diagnosis not present

## 2017-03-30 DIAGNOSIS — E349 Endocrine disorder, unspecified: Secondary | ICD-10-CM | POA: Diagnosis not present

## 2017-04-05 DIAGNOSIS — H401132 Primary open-angle glaucoma, bilateral, moderate stage: Secondary | ICD-10-CM | POA: Diagnosis not present

## 2017-04-05 DIAGNOSIS — H409 Unspecified glaucoma: Secondary | ICD-10-CM | POA: Diagnosis not present

## 2017-04-05 DIAGNOSIS — H401231 Low-tension glaucoma, bilateral, mild stage: Secondary | ICD-10-CM | POA: Diagnosis not present

## 2017-04-05 DIAGNOSIS — H01009 Unspecified blepharitis unspecified eye, unspecified eyelid: Secondary | ICD-10-CM | POA: Diagnosis not present

## 2017-04-05 DIAGNOSIS — Z299 Encounter for prophylactic measures, unspecified: Secondary | ICD-10-CM | POA: Diagnosis not present

## 2017-04-05 DIAGNOSIS — M79671 Pain in right foot: Secondary | ICD-10-CM | POA: Diagnosis not present

## 2017-04-05 DIAGNOSIS — H538 Other visual disturbances: Secondary | ICD-10-CM | POA: Diagnosis not present

## 2017-04-05 DIAGNOSIS — H35371 Puckering of macula, right eye: Secondary | ICD-10-CM | POA: Diagnosis not present

## 2017-04-05 DIAGNOSIS — E349 Endocrine disorder, unspecified: Secondary | ICD-10-CM | POA: Diagnosis not present

## 2017-04-05 DIAGNOSIS — Z6832 Body mass index (BMI) 32.0-32.9, adult: Secondary | ICD-10-CM | POA: Diagnosis not present

## 2017-04-18 DIAGNOSIS — H353131 Nonexudative age-related macular degeneration, bilateral, early dry stage: Secondary | ICD-10-CM | POA: Diagnosis not present

## 2017-04-18 DIAGNOSIS — H53412 Scotoma involving central area, left eye: Secondary | ICD-10-CM | POA: Diagnosis not present

## 2017-04-18 DIAGNOSIS — H2512 Age-related nuclear cataract, left eye: Secondary | ICD-10-CM | POA: Diagnosis not present

## 2017-04-18 DIAGNOSIS — H409 Unspecified glaucoma: Secondary | ICD-10-CM | POA: Diagnosis not present

## 2017-04-18 DIAGNOSIS — H0289 Other specified disorders of eyelid: Secondary | ICD-10-CM | POA: Diagnosis not present

## 2017-04-18 DIAGNOSIS — Z961 Presence of intraocular lens: Secondary | ICD-10-CM | POA: Diagnosis not present

## 2017-04-18 DIAGNOSIS — H543 Unqualified visual loss, both eyes: Secondary | ICD-10-CM | POA: Diagnosis not present

## 2017-04-19 DIAGNOSIS — M5412 Radiculopathy, cervical region: Secondary | ICD-10-CM | POA: Diagnosis not present

## 2017-04-19 DIAGNOSIS — M9901 Segmental and somatic dysfunction of cervical region: Secondary | ICD-10-CM | POA: Diagnosis not present

## 2017-04-26 DIAGNOSIS — E349 Endocrine disorder, unspecified: Secondary | ICD-10-CM | POA: Diagnosis not present

## 2017-04-26 DIAGNOSIS — Z299 Encounter for prophylactic measures, unspecified: Secondary | ICD-10-CM | POA: Diagnosis not present

## 2017-04-26 DIAGNOSIS — G600 Hereditary motor and sensory neuropathy: Secondary | ICD-10-CM | POA: Diagnosis not present

## 2017-04-26 DIAGNOSIS — D751 Secondary polycythemia: Secondary | ICD-10-CM | POA: Diagnosis not present

## 2017-04-26 DIAGNOSIS — Z6832 Body mass index (BMI) 32.0-32.9, adult: Secondary | ICD-10-CM | POA: Diagnosis not present

## 2017-04-26 DIAGNOSIS — E039 Hypothyroidism, unspecified: Secondary | ICD-10-CM | POA: Diagnosis not present

## 2017-04-26 DIAGNOSIS — E7800 Pure hypercholesterolemia, unspecified: Secondary | ICD-10-CM | POA: Diagnosis not present

## 2017-04-26 DIAGNOSIS — M79672 Pain in left foot: Secondary | ICD-10-CM | POA: Diagnosis not present

## 2017-04-26 DIAGNOSIS — M1990 Unspecified osteoarthritis, unspecified site: Secondary | ICD-10-CM | POA: Diagnosis not present

## 2017-04-26 DIAGNOSIS — M79671 Pain in right foot: Secondary | ICD-10-CM | POA: Diagnosis not present

## 2017-04-27 DIAGNOSIS — H353131 Nonexudative age-related macular degeneration, bilateral, early dry stage: Secondary | ICD-10-CM | POA: Diagnosis not present

## 2017-04-27 DIAGNOSIS — H401132 Primary open-angle glaucoma, bilateral, moderate stage: Secondary | ICD-10-CM | POA: Diagnosis not present

## 2017-04-27 DIAGNOSIS — E039 Hypothyroidism, unspecified: Secondary | ICD-10-CM | POA: Diagnosis not present

## 2017-04-27 DIAGNOSIS — H0289 Other specified disorders of eyelid: Secondary | ICD-10-CM | POA: Diagnosis not present

## 2017-04-27 DIAGNOSIS — H2512 Age-related nuclear cataract, left eye: Secondary | ICD-10-CM | POA: Diagnosis not present

## 2017-04-27 DIAGNOSIS — Z961 Presence of intraocular lens: Secondary | ICD-10-CM | POA: Diagnosis not present

## 2017-05-02 DIAGNOSIS — M9901 Segmental and somatic dysfunction of cervical region: Secondary | ICD-10-CM | POA: Diagnosis not present

## 2017-05-02 DIAGNOSIS — M5412 Radiculopathy, cervical region: Secondary | ICD-10-CM | POA: Diagnosis not present

## 2017-05-04 DIAGNOSIS — H47291 Other optic atrophy, right eye: Secondary | ICD-10-CM | POA: Diagnosis not present

## 2017-05-04 DIAGNOSIS — Z961 Presence of intraocular lens: Secondary | ICD-10-CM | POA: Diagnosis not present

## 2017-05-04 DIAGNOSIS — H353131 Nonexudative age-related macular degeneration, bilateral, early dry stage: Secondary | ICD-10-CM | POA: Diagnosis not present

## 2017-05-04 DIAGNOSIS — Z9841 Cataract extraction status, right eye: Secondary | ICD-10-CM | POA: Diagnosis not present

## 2017-05-04 DIAGNOSIS — H25812 Combined forms of age-related cataract, left eye: Secondary | ICD-10-CM | POA: Diagnosis not present

## 2017-05-04 DIAGNOSIS — H401121 Primary open-angle glaucoma, left eye, mild stage: Secondary | ICD-10-CM | POA: Diagnosis not present

## 2017-05-04 DIAGNOSIS — E039 Hypothyroidism, unspecified: Secondary | ICD-10-CM | POA: Diagnosis not present

## 2017-05-04 DIAGNOSIS — H401113 Primary open-angle glaucoma, right eye, severe stage: Secondary | ICD-10-CM | POA: Diagnosis not present

## 2017-05-04 DIAGNOSIS — Z79899 Other long term (current) drug therapy: Secondary | ICD-10-CM | POA: Diagnosis not present

## 2017-05-17 DIAGNOSIS — M9901 Segmental and somatic dysfunction of cervical region: Secondary | ICD-10-CM | POA: Diagnosis not present

## 2017-05-17 DIAGNOSIS — M5412 Radiculopathy, cervical region: Secondary | ICD-10-CM | POA: Diagnosis not present

## 2017-06-06 DIAGNOSIS — H02834 Dermatochalasis of left upper eyelid: Secondary | ICD-10-CM | POA: Diagnosis not present

## 2017-06-06 DIAGNOSIS — H353131 Nonexudative age-related macular degeneration, bilateral, early dry stage: Secondary | ICD-10-CM | POA: Diagnosis not present

## 2017-06-06 DIAGNOSIS — H2512 Age-related nuclear cataract, left eye: Secondary | ICD-10-CM | POA: Diagnosis not present

## 2017-06-06 DIAGNOSIS — Z79899 Other long term (current) drug therapy: Secondary | ICD-10-CM | POA: Diagnosis not present

## 2017-06-06 DIAGNOSIS — H0289 Other specified disorders of eyelid: Secondary | ICD-10-CM | POA: Diagnosis not present

## 2017-06-06 DIAGNOSIS — H02831 Dermatochalasis of right upper eyelid: Secondary | ICD-10-CM | POA: Diagnosis not present

## 2017-06-06 DIAGNOSIS — E039 Hypothyroidism, unspecified: Secondary | ICD-10-CM | POA: Diagnosis not present

## 2017-06-06 DIAGNOSIS — H401121 Primary open-angle glaucoma, left eye, mild stage: Secondary | ICD-10-CM | POA: Diagnosis not present

## 2017-06-06 DIAGNOSIS — H401113 Primary open-angle glaucoma, right eye, severe stage: Secondary | ICD-10-CM | POA: Diagnosis not present

## 2017-06-06 DIAGNOSIS — Z961 Presence of intraocular lens: Secondary | ICD-10-CM | POA: Diagnosis not present

## 2017-06-07 DIAGNOSIS — E349 Endocrine disorder, unspecified: Secondary | ICD-10-CM | POA: Diagnosis not present

## 2017-06-07 DIAGNOSIS — R5383 Other fatigue: Secondary | ICD-10-CM | POA: Diagnosis not present

## 2017-06-08 DIAGNOSIS — E349 Endocrine disorder, unspecified: Secondary | ICD-10-CM | POA: Diagnosis not present

## 2017-06-20 DIAGNOSIS — Z79899 Other long term (current) drug therapy: Secondary | ICD-10-CM | POA: Diagnosis not present

## 2017-06-20 DIAGNOSIS — H02833 Dermatochalasis of right eye, unspecified eyelid: Secondary | ICD-10-CM | POA: Diagnosis not present

## 2017-06-20 DIAGNOSIS — H43822 Vitreomacular adhesion, left eye: Secondary | ICD-10-CM | POA: Diagnosis not present

## 2017-06-20 DIAGNOSIS — H02836 Dermatochalasis of left eye, unspecified eyelid: Secondary | ICD-10-CM | POA: Diagnosis not present

## 2017-06-20 DIAGNOSIS — H43393 Other vitreous opacities, bilateral: Secondary | ICD-10-CM | POA: Diagnosis not present

## 2017-06-20 DIAGNOSIS — H43813 Vitreous degeneration, bilateral: Secondary | ICD-10-CM | POA: Diagnosis not present

## 2017-06-20 DIAGNOSIS — T8529XA Other mechanical complication of intraocular lens, initial encounter: Secondary | ICD-10-CM | POA: Diagnosis not present

## 2017-06-20 DIAGNOSIS — H35373 Puckering of macula, bilateral: Secondary | ICD-10-CM | POA: Diagnosis not present

## 2017-06-20 DIAGNOSIS — E039 Hypothyroidism, unspecified: Secondary | ICD-10-CM | POA: Diagnosis not present

## 2017-06-20 DIAGNOSIS — H25812 Combined forms of age-related cataract, left eye: Secondary | ICD-10-CM | POA: Diagnosis not present

## 2017-06-20 DIAGNOSIS — Z9841 Cataract extraction status, right eye: Secondary | ICD-10-CM | POA: Diagnosis not present

## 2017-06-20 DIAGNOSIS — H401133 Primary open-angle glaucoma, bilateral, severe stage: Secondary | ICD-10-CM | POA: Diagnosis not present

## 2017-06-20 DIAGNOSIS — Z961 Presence of intraocular lens: Secondary | ICD-10-CM | POA: Diagnosis not present

## 2017-06-20 DIAGNOSIS — H11823 Conjunctivochalasis, bilateral: Secondary | ICD-10-CM | POA: Diagnosis not present

## 2017-06-20 DIAGNOSIS — H353132 Nonexudative age-related macular degeneration, bilateral, intermediate dry stage: Secondary | ICD-10-CM | POA: Diagnosis not present

## 2017-06-22 DIAGNOSIS — E349 Endocrine disorder, unspecified: Secondary | ICD-10-CM | POA: Diagnosis not present

## 2017-07-02 DIAGNOSIS — E349 Endocrine disorder, unspecified: Secondary | ICD-10-CM | POA: Diagnosis not present

## 2017-07-11 DIAGNOSIS — G600 Hereditary motor and sensory neuropathy: Secondary | ICD-10-CM | POA: Diagnosis not present

## 2017-07-11 DIAGNOSIS — M1990 Unspecified osteoarthritis, unspecified site: Secondary | ICD-10-CM | POA: Diagnosis not present

## 2017-07-11 DIAGNOSIS — M79671 Pain in right foot: Secondary | ICD-10-CM | POA: Diagnosis not present

## 2017-07-11 DIAGNOSIS — M79672 Pain in left foot: Secondary | ICD-10-CM | POA: Diagnosis not present

## 2017-07-16 DIAGNOSIS — E349 Endocrine disorder, unspecified: Secondary | ICD-10-CM | POA: Diagnosis not present

## 2017-07-17 DIAGNOSIS — H02832 Dermatochalasis of right lower eyelid: Secondary | ICD-10-CM | POA: Diagnosis not present

## 2017-07-17 DIAGNOSIS — H11823 Conjunctivochalasis, bilateral: Secondary | ICD-10-CM | POA: Diagnosis not present

## 2017-07-17 DIAGNOSIS — H01024 Squamous blepharitis left upper eyelid: Secondary | ICD-10-CM | POA: Diagnosis not present

## 2017-07-17 DIAGNOSIS — H01025 Squamous blepharitis left lower eyelid: Secondary | ICD-10-CM | POA: Diagnosis not present

## 2017-07-17 DIAGNOSIS — H02834 Dermatochalasis of left upper eyelid: Secondary | ICD-10-CM | POA: Diagnosis not present

## 2017-07-17 DIAGNOSIS — Z961 Presence of intraocular lens: Secondary | ICD-10-CM | POA: Diagnosis not present

## 2017-07-17 DIAGNOSIS — H02403 Unspecified ptosis of bilateral eyelids: Secondary | ICD-10-CM | POA: Diagnosis not present

## 2017-07-17 DIAGNOSIS — H11153 Pinguecula, bilateral: Secondary | ICD-10-CM | POA: Diagnosis not present

## 2017-07-17 DIAGNOSIS — H353131 Nonexudative age-related macular degeneration, bilateral, early dry stage: Secondary | ICD-10-CM | POA: Diagnosis not present

## 2017-07-17 DIAGNOSIS — H5340 Unspecified visual field defects: Secondary | ICD-10-CM | POA: Diagnosis not present

## 2017-07-17 DIAGNOSIS — H01009 Unspecified blepharitis unspecified eye, unspecified eyelid: Secondary | ICD-10-CM | POA: Diagnosis not present

## 2017-07-17 DIAGNOSIS — E039 Hypothyroidism, unspecified: Secondary | ICD-10-CM | POA: Diagnosis not present

## 2017-07-17 DIAGNOSIS — H02835 Dermatochalasis of left lower eyelid: Secondary | ICD-10-CM | POA: Diagnosis not present

## 2017-07-17 DIAGNOSIS — H0289 Other specified disorders of eyelid: Secondary | ICD-10-CM | POA: Diagnosis not present

## 2017-07-17 DIAGNOSIS — Z9889 Other specified postprocedural states: Secondary | ICD-10-CM | POA: Diagnosis not present

## 2017-07-17 DIAGNOSIS — H4720 Unspecified optic atrophy: Secondary | ICD-10-CM | POA: Diagnosis not present

## 2017-07-17 DIAGNOSIS — H02831 Dermatochalasis of right upper eyelid: Secondary | ICD-10-CM | POA: Diagnosis not present

## 2017-07-17 DIAGNOSIS — H401133 Primary open-angle glaucoma, bilateral, severe stage: Secondary | ICD-10-CM | POA: Diagnosis not present

## 2017-07-17 DIAGNOSIS — Z9841 Cataract extraction status, right eye: Secondary | ICD-10-CM | POA: Diagnosis not present

## 2017-07-17 DIAGNOSIS — H01021 Squamous blepharitis right upper eyelid: Secondary | ICD-10-CM | POA: Diagnosis not present

## 2017-07-17 DIAGNOSIS — H353130 Nonexudative age-related macular degeneration, bilateral, stage unspecified: Secondary | ICD-10-CM | POA: Diagnosis not present

## 2017-07-17 DIAGNOSIS — H53412 Scotoma involving central area, left eye: Secondary | ICD-10-CM | POA: Diagnosis not present

## 2017-07-17 DIAGNOSIS — Z79899 Other long term (current) drug therapy: Secondary | ICD-10-CM | POA: Diagnosis not present

## 2017-07-17 DIAGNOSIS — H2512 Age-related nuclear cataract, left eye: Secondary | ICD-10-CM | POA: Diagnosis not present

## 2017-07-17 DIAGNOSIS — H43813 Vitreous degeneration, bilateral: Secondary | ICD-10-CM | POA: Diagnosis not present

## 2017-07-17 DIAGNOSIS — H01022 Squamous blepharitis right lower eyelid: Secondary | ICD-10-CM | POA: Diagnosis not present

## 2017-07-18 DIAGNOSIS — H401133 Primary open-angle glaucoma, bilateral, severe stage: Secondary | ICD-10-CM | POA: Diagnosis not present

## 2017-08-06 DIAGNOSIS — E349 Endocrine disorder, unspecified: Secondary | ICD-10-CM | POA: Diagnosis not present

## 2017-08-20 DIAGNOSIS — E349 Endocrine disorder, unspecified: Secondary | ICD-10-CM | POA: Diagnosis not present

## 2017-09-03 DIAGNOSIS — E349 Endocrine disorder, unspecified: Secondary | ICD-10-CM | POA: Diagnosis not present

## 2017-09-17 DIAGNOSIS — E349 Endocrine disorder, unspecified: Secondary | ICD-10-CM | POA: Diagnosis not present

## 2017-10-01 DIAGNOSIS — E349 Endocrine disorder, unspecified: Secondary | ICD-10-CM | POA: Diagnosis not present

## 2017-10-16 DIAGNOSIS — E349 Endocrine disorder, unspecified: Secondary | ICD-10-CM | POA: Diagnosis not present

## 2017-11-01 DIAGNOSIS — Z2821 Immunization not carried out because of patient refusal: Secondary | ICD-10-CM | POA: Diagnosis not present

## 2017-11-01 DIAGNOSIS — E349 Endocrine disorder, unspecified: Secondary | ICD-10-CM | POA: Diagnosis not present

## 2017-11-01 DIAGNOSIS — M545 Low back pain: Secondary | ICD-10-CM | POA: Diagnosis not present

## 2017-11-01 DIAGNOSIS — Z789 Other specified health status: Secondary | ICD-10-CM | POA: Diagnosis not present

## 2017-11-01 DIAGNOSIS — Z6833 Body mass index (BMI) 33.0-33.9, adult: Secondary | ICD-10-CM | POA: Diagnosis not present

## 2017-11-01 DIAGNOSIS — M9901 Segmental and somatic dysfunction of cervical region: Secondary | ICD-10-CM | POA: Diagnosis not present

## 2017-11-01 DIAGNOSIS — Z299 Encounter for prophylactic measures, unspecified: Secondary | ICD-10-CM | POA: Diagnosis not present

## 2017-11-01 DIAGNOSIS — M5412 Radiculopathy, cervical region: Secondary | ICD-10-CM | POA: Diagnosis not present

## 2017-11-16 DIAGNOSIS — E349 Endocrine disorder, unspecified: Secondary | ICD-10-CM | POA: Diagnosis not present

## 2017-11-30 DIAGNOSIS — E349 Endocrine disorder, unspecified: Secondary | ICD-10-CM | POA: Diagnosis not present

## 2017-12-04 DIAGNOSIS — H401111 Primary open-angle glaucoma, right eye, mild stage: Secondary | ICD-10-CM | POA: Diagnosis not present

## 2017-12-04 DIAGNOSIS — H401110 Primary open-angle glaucoma, right eye, stage unspecified: Secondary | ICD-10-CM | POA: Diagnosis not present

## 2017-12-06 DIAGNOSIS — H5371 Glare sensitivity: Secondary | ICD-10-CM | POA: Diagnosis not present

## 2017-12-06 DIAGNOSIS — H401113 Primary open-angle glaucoma, right eye, severe stage: Secondary | ICD-10-CM | POA: Diagnosis not present

## 2017-12-06 DIAGNOSIS — H2512 Age-related nuclear cataract, left eye: Secondary | ICD-10-CM | POA: Diagnosis not present

## 2017-12-06 DIAGNOSIS — H401121 Primary open-angle glaucoma, left eye, mild stage: Secondary | ICD-10-CM | POA: Diagnosis not present

## 2017-12-06 DIAGNOSIS — H401131 Primary open-angle glaucoma, bilateral, mild stage: Secondary | ICD-10-CM | POA: Diagnosis not present

## 2017-12-12 DIAGNOSIS — G600 Hereditary motor and sensory neuropathy: Secondary | ICD-10-CM | POA: Diagnosis not present

## 2017-12-12 DIAGNOSIS — M1990 Unspecified osteoarthritis, unspecified site: Secondary | ICD-10-CM | POA: Diagnosis not present

## 2017-12-12 DIAGNOSIS — M79672 Pain in left foot: Secondary | ICD-10-CM | POA: Diagnosis not present

## 2017-12-12 DIAGNOSIS — M79671 Pain in right foot: Secondary | ICD-10-CM | POA: Diagnosis not present

## 2017-12-14 DIAGNOSIS — E349 Endocrine disorder, unspecified: Secondary | ICD-10-CM | POA: Diagnosis not present

## 2017-12-28 DIAGNOSIS — E349 Endocrine disorder, unspecified: Secondary | ICD-10-CM | POA: Diagnosis not present

## 2018-01-03 DIAGNOSIS — E7800 Pure hypercholesterolemia, unspecified: Secondary | ICD-10-CM | POA: Diagnosis not present

## 2018-01-03 DIAGNOSIS — E349 Endocrine disorder, unspecified: Secondary | ICD-10-CM | POA: Diagnosis not present

## 2018-01-03 DIAGNOSIS — Z79899 Other long term (current) drug therapy: Secondary | ICD-10-CM | POA: Diagnosis not present

## 2018-01-03 DIAGNOSIS — Z125 Encounter for screening for malignant neoplasm of prostate: Secondary | ICD-10-CM | POA: Diagnosis not present

## 2018-01-03 DIAGNOSIS — R5383 Other fatigue: Secondary | ICD-10-CM | POA: Diagnosis not present

## 2018-01-03 DIAGNOSIS — Z0000 Encounter for general adult medical examination without abnormal findings: Secondary | ICD-10-CM | POA: Diagnosis not present

## 2018-01-03 DIAGNOSIS — Z6833 Body mass index (BMI) 33.0-33.9, adult: Secondary | ICD-10-CM | POA: Diagnosis not present

## 2018-01-03 DIAGNOSIS — E039 Hypothyroidism, unspecified: Secondary | ICD-10-CM | POA: Diagnosis not present

## 2018-01-03 DIAGNOSIS — Z1339 Encounter for screening examination for other mental health and behavioral disorders: Secondary | ICD-10-CM | POA: Diagnosis not present

## 2018-01-03 DIAGNOSIS — Z7189 Other specified counseling: Secondary | ICD-10-CM | POA: Diagnosis not present

## 2018-01-03 DIAGNOSIS — Z1211 Encounter for screening for malignant neoplasm of colon: Secondary | ICD-10-CM | POA: Diagnosis not present

## 2018-01-03 DIAGNOSIS — Z299 Encounter for prophylactic measures, unspecified: Secondary | ICD-10-CM | POA: Diagnosis not present

## 2018-01-03 DIAGNOSIS — Z1331 Encounter for screening for depression: Secondary | ICD-10-CM | POA: Diagnosis not present

## 2018-01-14 DIAGNOSIS — H401121 Primary open-angle glaucoma, left eye, mild stage: Secondary | ICD-10-CM | POA: Diagnosis not present

## 2018-01-14 DIAGNOSIS — H401113 Primary open-angle glaucoma, right eye, severe stage: Secondary | ICD-10-CM | POA: Diagnosis not present

## 2018-01-14 DIAGNOSIS — H2512 Age-related nuclear cataract, left eye: Secondary | ICD-10-CM | POA: Diagnosis not present

## 2018-01-18 DIAGNOSIS — L821 Other seborrheic keratosis: Secondary | ICD-10-CM | POA: Diagnosis not present

## 2018-01-18 DIAGNOSIS — Z299 Encounter for prophylactic measures, unspecified: Secondary | ICD-10-CM | POA: Diagnosis not present

## 2018-01-18 DIAGNOSIS — Z6833 Body mass index (BMI) 33.0-33.9, adult: Secondary | ICD-10-CM | POA: Diagnosis not present

## 2018-01-22 DIAGNOSIS — E349 Endocrine disorder, unspecified: Secondary | ICD-10-CM | POA: Diagnosis not present

## 2018-03-04 DIAGNOSIS — E349 Endocrine disorder, unspecified: Secondary | ICD-10-CM | POA: Diagnosis not present

## 2018-03-07 DIAGNOSIS — M9901 Segmental and somatic dysfunction of cervical region: Secondary | ICD-10-CM | POA: Diagnosis not present

## 2018-03-07 DIAGNOSIS — M5412 Radiculopathy, cervical region: Secondary | ICD-10-CM | POA: Diagnosis not present

## 2018-03-25 DIAGNOSIS — H2512 Age-related nuclear cataract, left eye: Secondary | ICD-10-CM | POA: Diagnosis not present

## 2018-03-25 DIAGNOSIS — H401121 Primary open-angle glaucoma, left eye, mild stage: Secondary | ICD-10-CM | POA: Diagnosis not present

## 2018-03-25 DIAGNOSIS — Z961 Presence of intraocular lens: Secondary | ICD-10-CM | POA: Diagnosis not present

## 2018-03-25 DIAGNOSIS — H401113 Primary open-angle glaucoma, right eye, severe stage: Secondary | ICD-10-CM | POA: Diagnosis not present

## 2018-03-25 DIAGNOSIS — H02882 Meibomian gland dysfunction right lower eyelid: Secondary | ICD-10-CM | POA: Diagnosis not present

## 2018-03-29 DIAGNOSIS — E349 Endocrine disorder, unspecified: Secondary | ICD-10-CM | POA: Diagnosis not present

## 2018-04-03 DIAGNOSIS — M5412 Radiculopathy, cervical region: Secondary | ICD-10-CM | POA: Diagnosis not present

## 2018-04-03 DIAGNOSIS — M9901 Segmental and somatic dysfunction of cervical region: Secondary | ICD-10-CM | POA: Diagnosis not present

## 2018-04-26 DIAGNOSIS — E349 Endocrine disorder, unspecified: Secondary | ICD-10-CM | POA: Diagnosis not present

## 2018-04-30 DIAGNOSIS — T8529XA Other mechanical complication of intraocular lens, initial encounter: Secondary | ICD-10-CM | POA: Diagnosis not present

## 2018-04-30 DIAGNOSIS — Z79899 Other long term (current) drug therapy: Secondary | ICD-10-CM | POA: Diagnosis not present

## 2018-04-30 DIAGNOSIS — Z7982 Long term (current) use of aspirin: Secondary | ICD-10-CM | POA: Diagnosis not present

## 2018-04-30 DIAGNOSIS — H04123 Dry eye syndrome of bilateral lacrimal glands: Secondary | ICD-10-CM | POA: Diagnosis not present

## 2018-04-30 DIAGNOSIS — H3530 Unspecified macular degeneration: Secondary | ICD-10-CM | POA: Diagnosis not present

## 2018-04-30 DIAGNOSIS — E039 Hypothyroidism, unspecified: Secondary | ICD-10-CM | POA: Diagnosis not present

## 2018-06-05 DIAGNOSIS — E349 Endocrine disorder, unspecified: Secondary | ICD-10-CM | POA: Diagnosis not present

## 2018-07-09 DIAGNOSIS — Z961 Presence of intraocular lens: Secondary | ICD-10-CM | POA: Diagnosis not present

## 2018-07-09 DIAGNOSIS — H2512 Age-related nuclear cataract, left eye: Secondary | ICD-10-CM | POA: Diagnosis not present

## 2018-07-09 DIAGNOSIS — H401121 Primary open-angle glaucoma, left eye, mild stage: Secondary | ICD-10-CM | POA: Diagnosis not present

## 2018-07-09 DIAGNOSIS — H401113 Primary open-angle glaucoma, right eye, severe stage: Secondary | ICD-10-CM | POA: Diagnosis not present

## 2018-07-12 DIAGNOSIS — E349 Endocrine disorder, unspecified: Secondary | ICD-10-CM | POA: Diagnosis not present

## 2018-07-30 DIAGNOSIS — H401121 Primary open-angle glaucoma, left eye, mild stage: Secondary | ICD-10-CM | POA: Diagnosis not present

## 2018-07-30 DIAGNOSIS — H401113 Primary open-angle glaucoma, right eye, severe stage: Secondary | ICD-10-CM | POA: Diagnosis not present

## 2018-07-30 DIAGNOSIS — H2512 Age-related nuclear cataract, left eye: Secondary | ICD-10-CM | POA: Diagnosis not present

## 2018-08-07 DIAGNOSIS — E349 Endocrine disorder, unspecified: Secondary | ICD-10-CM | POA: Diagnosis not present

## 2018-08-15 DIAGNOSIS — M9901 Segmental and somatic dysfunction of cervical region: Secondary | ICD-10-CM | POA: Diagnosis not present

## 2018-08-15 DIAGNOSIS — M5412 Radiculopathy, cervical region: Secondary | ICD-10-CM | POA: Diagnosis not present

## 2018-09-03 DIAGNOSIS — H401121 Primary open-angle glaucoma, left eye, mild stage: Secondary | ICD-10-CM | POA: Diagnosis not present

## 2018-09-03 DIAGNOSIS — Z961 Presence of intraocular lens: Secondary | ICD-10-CM | POA: Diagnosis not present

## 2018-09-03 DIAGNOSIS — H401113 Primary open-angle glaucoma, right eye, severe stage: Secondary | ICD-10-CM | POA: Diagnosis not present

## 2018-09-03 DIAGNOSIS — H2512 Age-related nuclear cataract, left eye: Secondary | ICD-10-CM | POA: Diagnosis not present

## 2018-09-12 DIAGNOSIS — H04123 Dry eye syndrome of bilateral lacrimal glands: Secondary | ICD-10-CM | POA: Diagnosis not present

## 2018-09-12 DIAGNOSIS — H018 Other specified inflammations of eyelid: Secondary | ICD-10-CM | POA: Diagnosis not present

## 2018-09-12 DIAGNOSIS — H401121 Primary open-angle glaucoma, left eye, mild stage: Secondary | ICD-10-CM | POA: Diagnosis not present

## 2018-09-12 DIAGNOSIS — H401113 Primary open-angle glaucoma, right eye, severe stage: Secondary | ICD-10-CM | POA: Diagnosis not present

## 2018-09-18 DIAGNOSIS — M79671 Pain in right foot: Secondary | ICD-10-CM | POA: Diagnosis not present

## 2018-09-18 DIAGNOSIS — M1990 Unspecified osteoarthritis, unspecified site: Secondary | ICD-10-CM | POA: Diagnosis not present

## 2018-09-18 DIAGNOSIS — M79672 Pain in left foot: Secondary | ICD-10-CM | POA: Diagnosis not present

## 2018-09-18 DIAGNOSIS — G600 Hereditary motor and sensory neuropathy: Secondary | ICD-10-CM | POA: Diagnosis not present

## 2018-09-20 DIAGNOSIS — Z23 Encounter for immunization: Secondary | ICD-10-CM | POA: Diagnosis not present

## 2018-09-20 DIAGNOSIS — E349 Endocrine disorder, unspecified: Secondary | ICD-10-CM | POA: Diagnosis not present

## 2018-09-24 DIAGNOSIS — E349 Endocrine disorder, unspecified: Secondary | ICD-10-CM | POA: Diagnosis not present

## 2018-10-08 DIAGNOSIS — H401113 Primary open-angle glaucoma, right eye, severe stage: Secondary | ICD-10-CM | POA: Diagnosis not present

## 2018-10-08 DIAGNOSIS — H04123 Dry eye syndrome of bilateral lacrimal glands: Secondary | ICD-10-CM | POA: Diagnosis not present

## 2018-10-08 DIAGNOSIS — H401121 Primary open-angle glaucoma, left eye, mild stage: Secondary | ICD-10-CM | POA: Diagnosis not present

## 2018-10-11 DIAGNOSIS — E349 Endocrine disorder, unspecified: Secondary | ICD-10-CM | POA: Diagnosis not present

## 2018-10-15 DIAGNOSIS — Z299 Encounter for prophylactic measures, unspecified: Secondary | ICD-10-CM | POA: Diagnosis not present

## 2018-10-15 DIAGNOSIS — M79671 Pain in right foot: Secondary | ICD-10-CM | POA: Diagnosis not present

## 2018-10-15 DIAGNOSIS — Z6833 Body mass index (BMI) 33.0-33.9, adult: Secondary | ICD-10-CM | POA: Diagnosis not present

## 2018-10-22 DIAGNOSIS — E349 Endocrine disorder, unspecified: Secondary | ICD-10-CM | POA: Diagnosis not present

## 2018-10-25 DIAGNOSIS — Z888 Allergy status to other drugs, medicaments and biological substances status: Secondary | ICD-10-CM | POA: Diagnosis not present

## 2018-10-25 DIAGNOSIS — H3530 Unspecified macular degeneration: Secondary | ICD-10-CM | POA: Diagnosis not present

## 2018-10-25 DIAGNOSIS — E039 Hypothyroidism, unspecified: Secondary | ICD-10-CM | POA: Diagnosis not present

## 2018-10-25 DIAGNOSIS — H401113 Primary open-angle glaucoma, right eye, severe stage: Secondary | ICD-10-CM | POA: Diagnosis not present

## 2018-10-28 DIAGNOSIS — H401113 Primary open-angle glaucoma, right eye, severe stage: Secondary | ICD-10-CM | POA: Diagnosis not present

## 2018-11-11 DIAGNOSIS — R6889 Other general symptoms and signs: Secondary | ICD-10-CM | POA: Diagnosis not present

## 2018-11-11 DIAGNOSIS — Z6833 Body mass index (BMI) 33.0-33.9, adult: Secondary | ICD-10-CM | POA: Diagnosis not present

## 2018-11-11 DIAGNOSIS — J069 Acute upper respiratory infection, unspecified: Secondary | ICD-10-CM | POA: Diagnosis not present

## 2018-11-11 DIAGNOSIS — R05 Cough: Secondary | ICD-10-CM | POA: Diagnosis not present

## 2018-11-11 DIAGNOSIS — Z299 Encounter for prophylactic measures, unspecified: Secondary | ICD-10-CM | POA: Diagnosis not present

## 2018-11-21 DIAGNOSIS — Z6831 Body mass index (BMI) 31.0-31.9, adult: Secondary | ICD-10-CM | POA: Diagnosis not present

## 2018-11-21 DIAGNOSIS — J069 Acute upper respiratory infection, unspecified: Secondary | ICD-10-CM | POA: Diagnosis not present

## 2018-11-21 DIAGNOSIS — E349 Endocrine disorder, unspecified: Secondary | ICD-10-CM | POA: Diagnosis not present

## 2018-11-21 DIAGNOSIS — Z299 Encounter for prophylactic measures, unspecified: Secondary | ICD-10-CM | POA: Diagnosis not present

## 2018-12-12 DIAGNOSIS — E349 Endocrine disorder, unspecified: Secondary | ICD-10-CM | POA: Diagnosis not present

## 2018-12-24 DIAGNOSIS — E349 Endocrine disorder, unspecified: Secondary | ICD-10-CM | POA: Diagnosis not present

## 2019-01-06 DIAGNOSIS — H35371 Puckering of macula, right eye: Secondary | ICD-10-CM | POA: Diagnosis not present

## 2019-01-09 DIAGNOSIS — R5383 Other fatigue: Secondary | ICD-10-CM | POA: Diagnosis not present

## 2019-01-09 DIAGNOSIS — Z1331 Encounter for screening for depression: Secondary | ICD-10-CM | POA: Diagnosis not present

## 2019-01-09 DIAGNOSIS — E7800 Pure hypercholesterolemia, unspecified: Secondary | ICD-10-CM | POA: Diagnosis not present

## 2019-01-09 DIAGNOSIS — Z79899 Other long term (current) drug therapy: Secondary | ICD-10-CM | POA: Diagnosis not present

## 2019-01-09 DIAGNOSIS — Z125 Encounter for screening for malignant neoplasm of prostate: Secondary | ICD-10-CM | POA: Diagnosis not present

## 2019-01-09 DIAGNOSIS — Z299 Encounter for prophylactic measures, unspecified: Secondary | ICD-10-CM | POA: Diagnosis not present

## 2019-01-09 DIAGNOSIS — Z7189 Other specified counseling: Secondary | ICD-10-CM | POA: Diagnosis not present

## 2019-01-09 DIAGNOSIS — Z1339 Encounter for screening examination for other mental health and behavioral disorders: Secondary | ICD-10-CM | POA: Diagnosis not present

## 2019-01-09 DIAGNOSIS — Z1211 Encounter for screening for malignant neoplasm of colon: Secondary | ICD-10-CM | POA: Diagnosis not present

## 2019-01-09 DIAGNOSIS — Z6833 Body mass index (BMI) 33.0-33.9, adult: Secondary | ICD-10-CM | POA: Diagnosis not present

## 2019-01-09 DIAGNOSIS — Z0000 Encounter for general adult medical examination without abnormal findings: Secondary | ICD-10-CM | POA: Diagnosis not present

## 2019-01-09 DIAGNOSIS — E349 Endocrine disorder, unspecified: Secondary | ICD-10-CM | POA: Diagnosis not present

## 2019-01-09 DIAGNOSIS — E039 Hypothyroidism, unspecified: Secondary | ICD-10-CM | POA: Diagnosis not present

## 2019-01-14 DIAGNOSIS — Z1211 Encounter for screening for malignant neoplasm of colon: Secondary | ICD-10-CM | POA: Diagnosis not present

## 2019-01-30 DIAGNOSIS — E349 Endocrine disorder, unspecified: Secondary | ICD-10-CM | POA: Diagnosis not present

## 2019-02-03 DIAGNOSIS — H35351 Cystoid macular degeneration, right eye: Secondary | ICD-10-CM | POA: Diagnosis not present

## 2019-02-20 DIAGNOSIS — E349 Endocrine disorder, unspecified: Secondary | ICD-10-CM | POA: Diagnosis not present

## 2019-03-06 DIAGNOSIS — E349 Endocrine disorder, unspecified: Secondary | ICD-10-CM | POA: Diagnosis not present

## 2019-03-20 DIAGNOSIS — H353211 Exudative age-related macular degeneration, right eye, with active choroidal neovascularization: Secondary | ICD-10-CM | POA: Diagnosis not present

## 2019-03-20 DIAGNOSIS — H353132 Nonexudative age-related macular degeneration, bilateral, intermediate dry stage: Secondary | ICD-10-CM | POA: Diagnosis not present

## 2019-03-20 DIAGNOSIS — H35351 Cystoid macular degeneration, right eye: Secondary | ICD-10-CM | POA: Diagnosis not present

## 2019-03-21 DIAGNOSIS — E349 Endocrine disorder, unspecified: Secondary | ICD-10-CM | POA: Diagnosis not present

## 2019-04-03 DIAGNOSIS — E349 Endocrine disorder, unspecified: Secondary | ICD-10-CM | POA: Diagnosis not present

## 2019-04-15 DIAGNOSIS — E349 Endocrine disorder, unspecified: Secondary | ICD-10-CM | POA: Diagnosis not present

## 2019-04-15 DIAGNOSIS — Z299 Encounter for prophylactic measures, unspecified: Secondary | ICD-10-CM | POA: Diagnosis not present

## 2019-04-15 DIAGNOSIS — Z6833 Body mass index (BMI) 33.0-33.9, adult: Secondary | ICD-10-CM | POA: Diagnosis not present

## 2019-04-15 DIAGNOSIS — E039 Hypothyroidism, unspecified: Secondary | ICD-10-CM | POA: Diagnosis not present

## 2019-04-15 DIAGNOSIS — I10 Essential (primary) hypertension: Secondary | ICD-10-CM | POA: Diagnosis not present

## 2019-04-22 DIAGNOSIS — Z6832 Body mass index (BMI) 32.0-32.9, adult: Secondary | ICD-10-CM | POA: Diagnosis not present

## 2019-04-22 DIAGNOSIS — Z299 Encounter for prophylactic measures, unspecified: Secondary | ICD-10-CM | POA: Diagnosis not present

## 2019-04-22 DIAGNOSIS — D692 Other nonthrombocytopenic purpura: Secondary | ICD-10-CM | POA: Diagnosis not present

## 2019-04-22 DIAGNOSIS — E039 Hypothyroidism, unspecified: Secondary | ICD-10-CM | POA: Diagnosis not present

## 2019-04-22 DIAGNOSIS — N400 Enlarged prostate without lower urinary tract symptoms: Secondary | ICD-10-CM | POA: Diagnosis not present

## 2019-04-22 DIAGNOSIS — I10 Essential (primary) hypertension: Secondary | ICD-10-CM | POA: Diagnosis not present

## 2019-04-24 DIAGNOSIS — H5789 Other specified disorders of eye and adnexa: Secondary | ICD-10-CM | POA: Diagnosis not present

## 2019-04-24 DIAGNOSIS — H35371 Puckering of macula, right eye: Secondary | ICD-10-CM | POA: Diagnosis not present

## 2019-04-24 DIAGNOSIS — R6889 Other general symptoms and signs: Secondary | ICD-10-CM | POA: Diagnosis not present

## 2019-04-24 DIAGNOSIS — H35351 Cystoid macular degeneration, right eye: Secondary | ICD-10-CM | POA: Diagnosis not present

## 2019-04-24 DIAGNOSIS — H353211 Exudative age-related macular degeneration, right eye, with active choroidal neovascularization: Secondary | ICD-10-CM | POA: Diagnosis not present

## 2019-04-24 DIAGNOSIS — H353132 Nonexudative age-related macular degeneration, bilateral, intermediate dry stage: Secondary | ICD-10-CM | POA: Diagnosis not present

## 2019-04-24 DIAGNOSIS — H538 Other visual disturbances: Secondary | ICD-10-CM | POA: Diagnosis not present

## 2019-05-13 DIAGNOSIS — E349 Endocrine disorder, unspecified: Secondary | ICD-10-CM | POA: Diagnosis not present

## 2019-05-20 DIAGNOSIS — N400 Enlarged prostate without lower urinary tract symptoms: Secondary | ICD-10-CM | POA: Diagnosis not present

## 2019-05-20 DIAGNOSIS — Z299 Encounter for prophylactic measures, unspecified: Secondary | ICD-10-CM | POA: Diagnosis not present

## 2019-05-20 DIAGNOSIS — Z6831 Body mass index (BMI) 31.0-31.9, adult: Secondary | ICD-10-CM | POA: Diagnosis not present

## 2019-05-20 DIAGNOSIS — I10 Essential (primary) hypertension: Secondary | ICD-10-CM | POA: Diagnosis not present

## 2019-05-20 DIAGNOSIS — N529 Male erectile dysfunction, unspecified: Secondary | ICD-10-CM | POA: Diagnosis not present

## 2019-05-28 DIAGNOSIS — G600 Hereditary motor and sensory neuropathy: Secondary | ICD-10-CM | POA: Diagnosis not present

## 2019-05-28 DIAGNOSIS — M1990 Unspecified osteoarthritis, unspecified site: Secondary | ICD-10-CM | POA: Diagnosis not present

## 2019-05-28 DIAGNOSIS — M79671 Pain in right foot: Secondary | ICD-10-CM | POA: Diagnosis not present

## 2019-05-28 DIAGNOSIS — M79672 Pain in left foot: Secondary | ICD-10-CM | POA: Diagnosis not present

## 2019-05-28 DIAGNOSIS — E349 Endocrine disorder, unspecified: Secondary | ICD-10-CM | POA: Diagnosis not present

## 2019-06-05 DIAGNOSIS — H35351 Cystoid macular degeneration, right eye: Secondary | ICD-10-CM | POA: Diagnosis not present

## 2019-06-05 DIAGNOSIS — Z9841 Cataract extraction status, right eye: Secondary | ICD-10-CM | POA: Diagnosis not present

## 2019-06-05 DIAGNOSIS — H353132 Nonexudative age-related macular degeneration, bilateral, intermediate dry stage: Secondary | ICD-10-CM | POA: Diagnosis not present

## 2019-06-05 DIAGNOSIS — H2512 Age-related nuclear cataract, left eye: Secondary | ICD-10-CM | POA: Diagnosis not present

## 2019-06-05 DIAGNOSIS — H353211 Exudative age-related macular degeneration, right eye, with active choroidal neovascularization: Secondary | ICD-10-CM | POA: Diagnosis not present

## 2019-06-23 DIAGNOSIS — H401121 Primary open-angle glaucoma, left eye, mild stage: Secondary | ICD-10-CM | POA: Diagnosis not present

## 2019-06-23 DIAGNOSIS — H401113 Primary open-angle glaucoma, right eye, severe stage: Secondary | ICD-10-CM | POA: Diagnosis not present

## 2019-06-27 DIAGNOSIS — E349 Endocrine disorder, unspecified: Secondary | ICD-10-CM | POA: Diagnosis not present

## 2019-07-17 DIAGNOSIS — H353132 Nonexudative age-related macular degeneration, bilateral, intermediate dry stage: Secondary | ICD-10-CM | POA: Diagnosis not present

## 2019-07-17 DIAGNOSIS — H35351 Cystoid macular degeneration, right eye: Secondary | ICD-10-CM | POA: Diagnosis not present

## 2019-07-17 DIAGNOSIS — H353211 Exudative age-related macular degeneration, right eye, with active choroidal neovascularization: Secondary | ICD-10-CM | POA: Diagnosis not present

## 2019-07-18 DIAGNOSIS — E349 Endocrine disorder, unspecified: Secondary | ICD-10-CM | POA: Diagnosis not present

## 2019-07-25 DIAGNOSIS — E349 Endocrine disorder, unspecified: Secondary | ICD-10-CM | POA: Diagnosis not present

## 2019-08-01 DIAGNOSIS — E349 Endocrine disorder, unspecified: Secondary | ICD-10-CM | POA: Diagnosis not present

## 2019-08-08 DIAGNOSIS — E349 Endocrine disorder, unspecified: Secondary | ICD-10-CM | POA: Diagnosis not present

## 2019-08-14 DIAGNOSIS — H353132 Nonexudative age-related macular degeneration, bilateral, intermediate dry stage: Secondary | ICD-10-CM | POA: Diagnosis not present

## 2019-08-14 DIAGNOSIS — H353211 Exudative age-related macular degeneration, right eye, with active choroidal neovascularization: Secondary | ICD-10-CM | POA: Diagnosis not present

## 2019-08-14 DIAGNOSIS — H35371 Puckering of macula, right eye: Secondary | ICD-10-CM | POA: Diagnosis not present

## 2019-08-14 DIAGNOSIS — H35351 Cystoid macular degeneration, right eye: Secondary | ICD-10-CM | POA: Diagnosis not present

## 2019-08-14 DIAGNOSIS — H401113 Primary open-angle glaucoma, right eye, severe stage: Secondary | ICD-10-CM | POA: Diagnosis not present

## 2019-08-15 DIAGNOSIS — E349 Endocrine disorder, unspecified: Secondary | ICD-10-CM | POA: Diagnosis not present

## 2019-08-22 DIAGNOSIS — E349 Endocrine disorder, unspecified: Secondary | ICD-10-CM | POA: Diagnosis not present

## 2019-08-29 DIAGNOSIS — E349 Endocrine disorder, unspecified: Secondary | ICD-10-CM | POA: Diagnosis not present

## 2019-09-05 DIAGNOSIS — E349 Endocrine disorder, unspecified: Secondary | ICD-10-CM | POA: Diagnosis not present

## 2019-09-18 DIAGNOSIS — H35351 Cystoid macular degeneration, right eye: Secondary | ICD-10-CM | POA: Diagnosis not present

## 2019-09-18 DIAGNOSIS — H353132 Nonexudative age-related macular degeneration, bilateral, intermediate dry stage: Secondary | ICD-10-CM | POA: Diagnosis not present

## 2019-09-18 DIAGNOSIS — H401113 Primary open-angle glaucoma, right eye, severe stage: Secondary | ICD-10-CM | POA: Diagnosis not present

## 2019-09-18 DIAGNOSIS — H35371 Puckering of macula, right eye: Secondary | ICD-10-CM | POA: Diagnosis not present

## 2019-09-18 DIAGNOSIS — H353211 Exudative age-related macular degeneration, right eye, with active choroidal neovascularization: Secondary | ICD-10-CM | POA: Diagnosis not present

## 2019-09-19 DIAGNOSIS — E349 Endocrine disorder, unspecified: Secondary | ICD-10-CM | POA: Diagnosis not present

## 2019-10-03 DIAGNOSIS — E349 Endocrine disorder, unspecified: Secondary | ICD-10-CM | POA: Diagnosis not present

## 2019-11-17 DIAGNOSIS — E349 Endocrine disorder, unspecified: Secondary | ICD-10-CM | POA: Diagnosis not present

## 2019-11-27 DIAGNOSIS — H353211 Exudative age-related macular degeneration, right eye, with active choroidal neovascularization: Secondary | ICD-10-CM | POA: Diagnosis not present

## 2019-11-27 DIAGNOSIS — H2512 Age-related nuclear cataract, left eye: Secondary | ICD-10-CM | POA: Diagnosis not present

## 2019-11-27 DIAGNOSIS — H353132 Nonexudative age-related macular degeneration, bilateral, intermediate dry stage: Secondary | ICD-10-CM | POA: Diagnosis not present

## 2019-11-27 DIAGNOSIS — Z9841 Cataract extraction status, right eye: Secondary | ICD-10-CM | POA: Diagnosis not present

## 2019-11-27 DIAGNOSIS — H35371 Puckering of macula, right eye: Secondary | ICD-10-CM | POA: Diagnosis not present

## 2019-11-27 DIAGNOSIS — H35351 Cystoid macular degeneration, right eye: Secondary | ICD-10-CM | POA: Diagnosis not present

## 2019-12-01 DIAGNOSIS — E349 Endocrine disorder, unspecified: Secondary | ICD-10-CM | POA: Diagnosis not present

## 2019-12-15 DIAGNOSIS — E349 Endocrine disorder, unspecified: Secondary | ICD-10-CM | POA: Diagnosis not present

## 2019-12-18 DIAGNOSIS — Z23 Encounter for immunization: Secondary | ICD-10-CM | POA: Diagnosis not present

## 2019-12-29 DIAGNOSIS — Z299 Encounter for prophylactic measures, unspecified: Secondary | ICD-10-CM | POA: Diagnosis not present

## 2019-12-29 DIAGNOSIS — E349 Endocrine disorder, unspecified: Secondary | ICD-10-CM | POA: Diagnosis not present

## 2020-01-12 DIAGNOSIS — I10 Essential (primary) hypertension: Secondary | ICD-10-CM | POA: Diagnosis not present

## 2020-01-12 DIAGNOSIS — Z125 Encounter for screening for malignant neoplasm of prostate: Secondary | ICD-10-CM | POA: Diagnosis not present

## 2020-01-12 DIAGNOSIS — E039 Hypothyroidism, unspecified: Secondary | ICD-10-CM | POA: Diagnosis not present

## 2020-01-12 DIAGNOSIS — E349 Endocrine disorder, unspecified: Secondary | ICD-10-CM | POA: Diagnosis not present

## 2020-01-12 DIAGNOSIS — R5383 Other fatigue: Secondary | ICD-10-CM | POA: Diagnosis not present

## 2020-01-12 DIAGNOSIS — E7800 Pure hypercholesterolemia, unspecified: Secondary | ICD-10-CM | POA: Diagnosis not present

## 2020-01-14 DIAGNOSIS — Z6831 Body mass index (BMI) 31.0-31.9, adult: Secondary | ICD-10-CM | POA: Diagnosis not present

## 2020-01-14 DIAGNOSIS — R5383 Other fatigue: Secondary | ICD-10-CM | POA: Diagnosis not present

## 2020-01-14 DIAGNOSIS — I10 Essential (primary) hypertension: Secondary | ICD-10-CM | POA: Diagnosis not present

## 2020-01-14 DIAGNOSIS — Z0000 Encounter for general adult medical examination without abnormal findings: Secondary | ICD-10-CM | POA: Diagnosis not present

## 2020-01-14 DIAGNOSIS — Z1211 Encounter for screening for malignant neoplasm of colon: Secondary | ICD-10-CM | POA: Diagnosis not present

## 2020-01-14 DIAGNOSIS — Z1339 Encounter for screening examination for other mental health and behavioral disorders: Secondary | ICD-10-CM | POA: Diagnosis not present

## 2020-01-14 DIAGNOSIS — Z1331 Encounter for screening for depression: Secondary | ICD-10-CM | POA: Diagnosis not present

## 2020-01-14 DIAGNOSIS — Z299 Encounter for prophylactic measures, unspecified: Secondary | ICD-10-CM | POA: Diagnosis not present

## 2020-01-14 DIAGNOSIS — E039 Hypothyroidism, unspecified: Secondary | ICD-10-CM | POA: Diagnosis not present

## 2020-01-14 DIAGNOSIS — Z7189 Other specified counseling: Secondary | ICD-10-CM | POA: Diagnosis not present

## 2020-01-14 DIAGNOSIS — E7800 Pure hypercholesterolemia, unspecified: Secondary | ICD-10-CM | POA: Diagnosis not present

## 2020-01-15 DIAGNOSIS — H2512 Age-related nuclear cataract, left eye: Secondary | ICD-10-CM | POA: Diagnosis not present

## 2020-01-15 DIAGNOSIS — Z23 Encounter for immunization: Secondary | ICD-10-CM | POA: Diagnosis not present

## 2020-01-15 DIAGNOSIS — H35371 Puckering of macula, right eye: Secondary | ICD-10-CM | POA: Diagnosis not present

## 2020-01-15 DIAGNOSIS — H3562 Retinal hemorrhage, left eye: Secondary | ICD-10-CM | POA: Diagnosis not present

## 2020-01-15 DIAGNOSIS — H35351 Cystoid macular degeneration, right eye: Secondary | ICD-10-CM | POA: Diagnosis not present

## 2020-01-15 DIAGNOSIS — H353132 Nonexudative age-related macular degeneration, bilateral, intermediate dry stage: Secondary | ICD-10-CM | POA: Diagnosis not present

## 2020-01-15 DIAGNOSIS — H353211 Exudative age-related macular degeneration, right eye, with active choroidal neovascularization: Secondary | ICD-10-CM | POA: Diagnosis not present

## 2020-02-16 DIAGNOSIS — E349 Endocrine disorder, unspecified: Secondary | ICD-10-CM | POA: Diagnosis not present

## 2020-02-23 DIAGNOSIS — H04123 Dry eye syndrome of bilateral lacrimal glands: Secondary | ICD-10-CM | POA: Diagnosis not present

## 2020-02-23 DIAGNOSIS — H401121 Primary open-angle glaucoma, left eye, mild stage: Secondary | ICD-10-CM | POA: Diagnosis not present

## 2020-02-23 DIAGNOSIS — H353211 Exudative age-related macular degeneration, right eye, with active choroidal neovascularization: Secondary | ICD-10-CM | POA: Diagnosis not present

## 2020-02-23 DIAGNOSIS — H401113 Primary open-angle glaucoma, right eye, severe stage: Secondary | ICD-10-CM | POA: Diagnosis not present

## 2020-02-24 DIAGNOSIS — D582 Other hemoglobinopathies: Secondary | ICD-10-CM | POA: Diagnosis not present

## 2020-02-24 DIAGNOSIS — N400 Enlarged prostate without lower urinary tract symptoms: Secondary | ICD-10-CM | POA: Diagnosis not present

## 2020-02-24 DIAGNOSIS — Z299 Encounter for prophylactic measures, unspecified: Secondary | ICD-10-CM | POA: Diagnosis not present

## 2020-02-24 DIAGNOSIS — R7301 Impaired fasting glucose: Secondary | ICD-10-CM | POA: Diagnosis not present

## 2020-02-26 DIAGNOSIS — Z9841 Cataract extraction status, right eye: Secondary | ICD-10-CM | POA: Diagnosis not present

## 2020-02-26 DIAGNOSIS — H04123 Dry eye syndrome of bilateral lacrimal glands: Secondary | ICD-10-CM | POA: Diagnosis not present

## 2020-02-26 DIAGNOSIS — H2512 Age-related nuclear cataract, left eye: Secondary | ICD-10-CM | POA: Diagnosis not present

## 2020-02-26 DIAGNOSIS — H59031 Cystoid macular edema following cataract surgery, right eye: Secondary | ICD-10-CM | POA: Diagnosis not present

## 2020-02-26 DIAGNOSIS — H35371 Puckering of macula, right eye: Secondary | ICD-10-CM | POA: Diagnosis not present

## 2020-02-26 DIAGNOSIS — H3562 Retinal hemorrhage, left eye: Secondary | ICD-10-CM | POA: Diagnosis not present

## 2020-02-26 DIAGNOSIS — H353231 Exudative age-related macular degeneration, bilateral, with active choroidal neovascularization: Secondary | ICD-10-CM | POA: Diagnosis not present

## 2020-02-26 DIAGNOSIS — H35351 Cystoid macular degeneration, right eye: Secondary | ICD-10-CM | POA: Diagnosis not present

## 2020-03-15 DIAGNOSIS — E349 Endocrine disorder, unspecified: Secondary | ICD-10-CM | POA: Diagnosis not present

## 2020-04-01 DIAGNOSIS — H35373 Puckering of macula, bilateral: Secondary | ICD-10-CM | POA: Diagnosis not present

## 2020-04-01 DIAGNOSIS — H353231 Exudative age-related macular degeneration, bilateral, with active choroidal neovascularization: Secondary | ICD-10-CM | POA: Diagnosis not present

## 2020-04-01 DIAGNOSIS — H348122 Central retinal vein occlusion, left eye, stable: Secondary | ICD-10-CM | POA: Diagnosis not present

## 2020-04-01 DIAGNOSIS — H04123 Dry eye syndrome of bilateral lacrimal glands: Secondary | ICD-10-CM | POA: Diagnosis not present

## 2020-04-01 DIAGNOSIS — H3562 Retinal hemorrhage, left eye: Secondary | ICD-10-CM | POA: Diagnosis not present

## 2020-04-01 DIAGNOSIS — H35353 Cystoid macular degeneration, bilateral: Secondary | ICD-10-CM | POA: Diagnosis not present

## 2020-04-02 DIAGNOSIS — H53122 Transient visual loss, left eye: Secondary | ICD-10-CM | POA: Diagnosis not present

## 2020-04-02 DIAGNOSIS — Z6830 Body mass index (BMI) 30.0-30.9, adult: Secondary | ICD-10-CM | POA: Diagnosis not present

## 2020-04-02 DIAGNOSIS — I10 Essential (primary) hypertension: Secondary | ICD-10-CM | POA: Diagnosis not present

## 2020-04-02 DIAGNOSIS — G453 Amaurosis fugax: Secondary | ICD-10-CM | POA: Diagnosis not present

## 2020-04-02 DIAGNOSIS — Z299 Encounter for prophylactic measures, unspecified: Secondary | ICD-10-CM | POA: Diagnosis not present

## 2020-04-02 DIAGNOSIS — E039 Hypothyroidism, unspecified: Secondary | ICD-10-CM | POA: Diagnosis not present

## 2020-04-06 DIAGNOSIS — N400 Enlarged prostate without lower urinary tract symptoms: Secondary | ICD-10-CM | POA: Diagnosis not present

## 2020-04-06 DIAGNOSIS — F419 Anxiety disorder, unspecified: Secondary | ICD-10-CM | POA: Diagnosis not present

## 2020-04-06 DIAGNOSIS — D473 Essential (hemorrhagic) thrombocythemia: Secondary | ICD-10-CM | POA: Diagnosis not present

## 2020-04-06 DIAGNOSIS — Z299 Encounter for prophylactic measures, unspecified: Secondary | ICD-10-CM | POA: Diagnosis not present

## 2020-04-06 DIAGNOSIS — D692 Other nonthrombocytopenic purpura: Secondary | ICD-10-CM | POA: Diagnosis not present

## 2020-04-07 DIAGNOSIS — H35371 Puckering of macula, right eye: Secondary | ICD-10-CM | POA: Diagnosis not present

## 2020-04-07 DIAGNOSIS — H348122 Central retinal vein occlusion, left eye, stable: Secondary | ICD-10-CM | POA: Diagnosis not present

## 2020-04-07 DIAGNOSIS — H35353 Cystoid macular degeneration, bilateral: Secondary | ICD-10-CM | POA: Diagnosis not present

## 2020-04-07 DIAGNOSIS — H353231 Exudative age-related macular degeneration, bilateral, with active choroidal neovascularization: Secondary | ICD-10-CM | POA: Diagnosis not present

## 2020-04-07 DIAGNOSIS — H35373 Puckering of macula, bilateral: Secondary | ICD-10-CM | POA: Diagnosis not present

## 2020-04-19 DIAGNOSIS — H53122 Transient visual loss, left eye: Secondary | ICD-10-CM | POA: Diagnosis not present

## 2020-04-22 DIAGNOSIS — H401121 Primary open-angle glaucoma, left eye, mild stage: Secondary | ICD-10-CM | POA: Diagnosis not present

## 2020-04-22 DIAGNOSIS — H401113 Primary open-angle glaucoma, right eye, severe stage: Secondary | ICD-10-CM | POA: Diagnosis not present

## 2020-05-05 DIAGNOSIS — I10 Essential (primary) hypertension: Secondary | ICD-10-CM | POA: Diagnosis not present

## 2020-05-13 DIAGNOSIS — H353231 Exudative age-related macular degeneration, bilateral, with active choroidal neovascularization: Secondary | ICD-10-CM | POA: Diagnosis not present

## 2020-05-13 DIAGNOSIS — H35373 Puckering of macula, bilateral: Secondary | ICD-10-CM | POA: Diagnosis not present

## 2020-05-13 DIAGNOSIS — H35351 Cystoid macular degeneration, right eye: Secondary | ICD-10-CM | POA: Diagnosis not present

## 2020-05-13 DIAGNOSIS — H348122 Central retinal vein occlusion, left eye, stable: Secondary | ICD-10-CM | POA: Diagnosis not present

## 2020-05-13 DIAGNOSIS — H3562 Retinal hemorrhage, left eye: Secondary | ICD-10-CM | POA: Diagnosis not present

## 2020-05-13 DIAGNOSIS — H35371 Puckering of macula, right eye: Secondary | ICD-10-CM | POA: Diagnosis not present

## 2020-05-13 DIAGNOSIS — H35353 Cystoid macular degeneration, bilateral: Secondary | ICD-10-CM | POA: Diagnosis not present

## 2020-05-13 DIAGNOSIS — H04123 Dry eye syndrome of bilateral lacrimal glands: Secondary | ICD-10-CM | POA: Diagnosis not present

## 2020-05-21 DIAGNOSIS — I10 Essential (primary) hypertension: Secondary | ICD-10-CM | POA: Diagnosis not present

## 2020-05-21 DIAGNOSIS — E039 Hypothyroidism, unspecified: Secondary | ICD-10-CM | POA: Diagnosis not present

## 2020-05-21 DIAGNOSIS — D473 Essential (hemorrhagic) thrombocythemia: Secondary | ICD-10-CM | POA: Diagnosis not present

## 2020-05-21 DIAGNOSIS — H353210 Exudative age-related macular degeneration, right eye, stage unspecified: Secondary | ICD-10-CM | POA: Diagnosis not present

## 2020-05-21 DIAGNOSIS — Z6830 Body mass index (BMI) 30.0-30.9, adult: Secondary | ICD-10-CM | POA: Diagnosis not present

## 2020-05-21 DIAGNOSIS — Z299 Encounter for prophylactic measures, unspecified: Secondary | ICD-10-CM | POA: Diagnosis not present

## 2020-05-21 DIAGNOSIS — E349 Endocrine disorder, unspecified: Secondary | ICD-10-CM | POA: Diagnosis not present

## 2020-06-04 DIAGNOSIS — I10 Essential (primary) hypertension: Secondary | ICD-10-CM | POA: Diagnosis not present

## 2020-06-09 DIAGNOSIS — Z7982 Long term (current) use of aspirin: Secondary | ICD-10-CM | POA: Diagnosis not present

## 2020-06-09 DIAGNOSIS — F17220 Nicotine dependence, chewing tobacco, uncomplicated: Secondary | ICD-10-CM | POA: Diagnosis not present

## 2020-06-09 DIAGNOSIS — H401121 Primary open-angle glaucoma, left eye, mild stage: Secondary | ICD-10-CM | POA: Diagnosis not present

## 2020-06-09 DIAGNOSIS — H401123 Primary open-angle glaucoma, left eye, severe stage: Secondary | ICD-10-CM | POA: Diagnosis not present

## 2020-06-09 DIAGNOSIS — Z79899 Other long term (current) drug therapy: Secondary | ICD-10-CM | POA: Diagnosis not present

## 2020-06-09 DIAGNOSIS — H2512 Age-related nuclear cataract, left eye: Secondary | ICD-10-CM | POA: Diagnosis not present

## 2020-06-09 DIAGNOSIS — I10 Essential (primary) hypertension: Secondary | ICD-10-CM | POA: Diagnosis not present

## 2020-06-24 DIAGNOSIS — H35353 Cystoid macular degeneration, bilateral: Secondary | ICD-10-CM | POA: Diagnosis not present

## 2020-06-24 DIAGNOSIS — H3562 Retinal hemorrhage, left eye: Secondary | ICD-10-CM | POA: Diagnosis not present

## 2020-06-24 DIAGNOSIS — H353231 Exudative age-related macular degeneration, bilateral, with active choroidal neovascularization: Secondary | ICD-10-CM | POA: Diagnosis not present

## 2020-06-24 DIAGNOSIS — H04123 Dry eye syndrome of bilateral lacrimal glands: Secondary | ICD-10-CM | POA: Diagnosis not present

## 2020-06-24 DIAGNOSIS — H348122 Central retinal vein occlusion, left eye, stable: Secondary | ICD-10-CM | POA: Diagnosis not present

## 2020-06-24 DIAGNOSIS — H35373 Puckering of macula, bilateral: Secondary | ICD-10-CM | POA: Diagnosis not present

## 2020-06-24 DIAGNOSIS — Z961 Presence of intraocular lens: Secondary | ICD-10-CM | POA: Diagnosis not present

## 2020-06-24 DIAGNOSIS — H35351 Cystoid macular degeneration, right eye: Secondary | ICD-10-CM | POA: Diagnosis not present

## 2020-06-24 DIAGNOSIS — H35371 Puckering of macula, right eye: Secondary | ICD-10-CM | POA: Diagnosis not present

## 2020-06-28 DIAGNOSIS — H43813 Vitreous degeneration, bilateral: Secondary | ICD-10-CM | POA: Diagnosis not present

## 2020-06-28 DIAGNOSIS — H35373 Puckering of macula, bilateral: Secondary | ICD-10-CM | POA: Diagnosis not present

## 2020-06-28 DIAGNOSIS — H353231 Exudative age-related macular degeneration, bilateral, with active choroidal neovascularization: Secondary | ICD-10-CM | POA: Diagnosis not present

## 2020-06-28 DIAGNOSIS — H35363 Drusen (degenerative) of macula, bilateral: Secondary | ICD-10-CM | POA: Diagnosis not present

## 2020-07-06 DIAGNOSIS — I10 Essential (primary) hypertension: Secondary | ICD-10-CM | POA: Diagnosis not present

## 2020-07-16 DIAGNOSIS — H35373 Puckering of macula, bilateral: Secondary | ICD-10-CM | POA: Diagnosis not present

## 2020-07-16 DIAGNOSIS — H3562 Retinal hemorrhage, left eye: Secondary | ICD-10-CM | POA: Diagnosis not present

## 2020-07-16 DIAGNOSIS — H34812 Central retinal vein occlusion, left eye, with macular edema: Secondary | ICD-10-CM | POA: Diagnosis not present

## 2020-07-16 DIAGNOSIS — H353231 Exudative age-related macular degeneration, bilateral, with active choroidal neovascularization: Secondary | ICD-10-CM | POA: Diagnosis not present

## 2020-07-16 DIAGNOSIS — H35351 Cystoid macular degeneration, right eye: Secondary | ICD-10-CM | POA: Diagnosis not present

## 2020-07-16 DIAGNOSIS — H35371 Puckering of macula, right eye: Secondary | ICD-10-CM | POA: Diagnosis not present

## 2020-07-16 DIAGNOSIS — H3582 Retinal ischemia: Secondary | ICD-10-CM | POA: Diagnosis not present

## 2020-07-16 DIAGNOSIS — H04123 Dry eye syndrome of bilateral lacrimal glands: Secondary | ICD-10-CM | POA: Diagnosis not present

## 2020-07-22 DIAGNOSIS — H348122 Central retinal vein occlusion, left eye, stable: Secondary | ICD-10-CM | POA: Diagnosis not present

## 2020-07-22 DIAGNOSIS — H353231 Exudative age-related macular degeneration, bilateral, with active choroidal neovascularization: Secondary | ICD-10-CM | POA: Diagnosis not present

## 2020-07-22 DIAGNOSIS — H3582 Retinal ischemia: Secondary | ICD-10-CM | POA: Diagnosis not present

## 2020-07-22 DIAGNOSIS — H3562 Retinal hemorrhage, left eye: Secondary | ICD-10-CM | POA: Diagnosis not present

## 2020-07-22 DIAGNOSIS — H04123 Dry eye syndrome of bilateral lacrimal glands: Secondary | ICD-10-CM | POA: Diagnosis not present

## 2020-07-22 DIAGNOSIS — H35373 Puckering of macula, bilateral: Secondary | ICD-10-CM | POA: Diagnosis not present

## 2020-07-22 DIAGNOSIS — H35352 Cystoid macular degeneration, left eye: Secondary | ICD-10-CM | POA: Diagnosis not present

## 2020-07-23 DIAGNOSIS — H04123 Dry eye syndrome of bilateral lacrimal glands: Secondary | ICD-10-CM | POA: Diagnosis not present

## 2020-07-23 DIAGNOSIS — H34812 Central retinal vein occlusion, left eye, with macular edema: Secondary | ICD-10-CM | POA: Diagnosis not present

## 2020-07-23 DIAGNOSIS — H35373 Puckering of macula, bilateral: Secondary | ICD-10-CM | POA: Diagnosis not present

## 2020-07-23 DIAGNOSIS — H353231 Exudative age-related macular degeneration, bilateral, with active choroidal neovascularization: Secondary | ICD-10-CM | POA: Diagnosis not present

## 2020-07-27 DIAGNOSIS — E349 Endocrine disorder, unspecified: Secondary | ICD-10-CM | POA: Diagnosis not present

## 2020-07-28 DIAGNOSIS — H353210 Exudative age-related macular degeneration, right eye, stage unspecified: Secondary | ICD-10-CM | POA: Diagnosis not present

## 2020-07-28 DIAGNOSIS — I10 Essential (primary) hypertension: Secondary | ICD-10-CM | POA: Diagnosis not present

## 2020-07-28 DIAGNOSIS — Z299 Encounter for prophylactic measures, unspecified: Secondary | ICD-10-CM | POA: Diagnosis not present

## 2020-07-28 DIAGNOSIS — F419 Anxiety disorder, unspecified: Secondary | ICD-10-CM | POA: Diagnosis not present

## 2020-07-28 DIAGNOSIS — F329 Major depressive disorder, single episode, unspecified: Secondary | ICD-10-CM | POA: Diagnosis not present

## 2020-08-05 DIAGNOSIS — I10 Essential (primary) hypertension: Secondary | ICD-10-CM | POA: Diagnosis not present

## 2020-08-10 DIAGNOSIS — Z9889 Other specified postprocedural states: Secondary | ICD-10-CM | POA: Diagnosis not present

## 2020-08-10 DIAGNOSIS — H353231 Exudative age-related macular degeneration, bilateral, with active choroidal neovascularization: Secondary | ICD-10-CM | POA: Diagnosis not present

## 2020-08-10 DIAGNOSIS — H43393 Other vitreous opacities, bilateral: Secondary | ICD-10-CM | POA: Diagnosis not present

## 2020-08-10 DIAGNOSIS — H35373 Puckering of macula, bilateral: Secondary | ICD-10-CM | POA: Diagnosis not present

## 2020-08-10 DIAGNOSIS — H34812 Central retinal vein occlusion, left eye, with macular edema: Secondary | ICD-10-CM | POA: Diagnosis not present

## 2020-08-17 DIAGNOSIS — H401113 Primary open-angle glaucoma, right eye, severe stage: Secondary | ICD-10-CM | POA: Diagnosis not present

## 2020-08-17 DIAGNOSIS — H34812 Central retinal vein occlusion, left eye, with macular edema: Secondary | ICD-10-CM | POA: Diagnosis not present

## 2020-08-17 DIAGNOSIS — H401121 Primary open-angle glaucoma, left eye, mild stage: Secondary | ICD-10-CM | POA: Diagnosis not present

## 2020-08-26 DIAGNOSIS — H04123 Dry eye syndrome of bilateral lacrimal glands: Secondary | ICD-10-CM | POA: Diagnosis not present

## 2020-08-26 DIAGNOSIS — H353231 Exudative age-related macular degeneration, bilateral, with active choroidal neovascularization: Secondary | ICD-10-CM | POA: Diagnosis not present

## 2020-08-26 DIAGNOSIS — H34812 Central retinal vein occlusion, left eye, with macular edema: Secondary | ICD-10-CM | POA: Diagnosis not present

## 2020-08-26 DIAGNOSIS — H35371 Puckering of macula, right eye: Secondary | ICD-10-CM | POA: Diagnosis not present

## 2020-08-26 DIAGNOSIS — H3562 Retinal hemorrhage, left eye: Secondary | ICD-10-CM | POA: Diagnosis not present

## 2020-08-26 DIAGNOSIS — H35373 Puckering of macula, bilateral: Secondary | ICD-10-CM | POA: Diagnosis not present

## 2020-08-26 DIAGNOSIS — Z961 Presence of intraocular lens: Secondary | ICD-10-CM | POA: Diagnosis not present

## 2020-08-26 DIAGNOSIS — H35351 Cystoid macular degeneration, right eye: Secondary | ICD-10-CM | POA: Diagnosis not present

## 2020-08-26 DIAGNOSIS — I10 Essential (primary) hypertension: Secondary | ICD-10-CM | POA: Diagnosis not present

## 2020-09-04 DIAGNOSIS — I10 Essential (primary) hypertension: Secondary | ICD-10-CM | POA: Diagnosis not present

## 2020-09-07 DIAGNOSIS — Z2821 Immunization not carried out because of patient refusal: Secondary | ICD-10-CM | POA: Diagnosis not present

## 2020-09-07 DIAGNOSIS — D473 Essential (hemorrhagic) thrombocythemia: Secondary | ICD-10-CM | POA: Diagnosis not present

## 2020-09-07 DIAGNOSIS — D582 Other hemoglobinopathies: Secondary | ICD-10-CM | POA: Diagnosis not present

## 2020-09-07 DIAGNOSIS — Z299 Encounter for prophylactic measures, unspecified: Secondary | ICD-10-CM | POA: Diagnosis not present

## 2020-09-07 DIAGNOSIS — R21 Rash and other nonspecific skin eruption: Secondary | ICD-10-CM | POA: Diagnosis not present

## 2020-09-07 DIAGNOSIS — E349 Endocrine disorder, unspecified: Secondary | ICD-10-CM | POA: Diagnosis not present

## 2020-09-23 DIAGNOSIS — H353231 Exudative age-related macular degeneration, bilateral, with active choroidal neovascularization: Secondary | ICD-10-CM | POA: Diagnosis not present

## 2020-09-23 DIAGNOSIS — H35373 Puckering of macula, bilateral: Secondary | ICD-10-CM | POA: Diagnosis not present

## 2020-09-23 DIAGNOSIS — H401121 Primary open-angle glaucoma, left eye, mild stage: Secondary | ICD-10-CM | POA: Diagnosis not present

## 2020-09-23 DIAGNOSIS — H3562 Retinal hemorrhage, left eye: Secondary | ICD-10-CM | POA: Diagnosis not present

## 2020-09-23 DIAGNOSIS — H401113 Primary open-angle glaucoma, right eye, severe stage: Secondary | ICD-10-CM | POA: Diagnosis not present

## 2020-09-23 DIAGNOSIS — H35351 Cystoid macular degeneration, right eye: Secondary | ICD-10-CM | POA: Diagnosis not present

## 2020-09-23 DIAGNOSIS — H3582 Retinal ischemia: Secondary | ICD-10-CM | POA: Diagnosis not present

## 2020-09-23 DIAGNOSIS — H34812 Central retinal vein occlusion, left eye, with macular edema: Secondary | ICD-10-CM | POA: Diagnosis not present

## 2020-10-05 DIAGNOSIS — F329 Major depressive disorder, single episode, unspecified: Secondary | ICD-10-CM | POA: Diagnosis not present

## 2020-10-05 DIAGNOSIS — I10 Essential (primary) hypertension: Secondary | ICD-10-CM | POA: Diagnosis not present

## 2020-10-05 DIAGNOSIS — E039 Hypothyroidism, unspecified: Secondary | ICD-10-CM | POA: Diagnosis not present

## 2020-10-07 DIAGNOSIS — H34812 Central retinal vein occlusion, left eye, with macular edema: Secondary | ICD-10-CM | POA: Diagnosis not present

## 2020-10-07 DIAGNOSIS — H401113 Primary open-angle glaucoma, right eye, severe stage: Secondary | ICD-10-CM | POA: Diagnosis not present

## 2020-10-07 DIAGNOSIS — Z79899 Other long term (current) drug therapy: Secondary | ICD-10-CM | POA: Diagnosis not present

## 2020-10-07 DIAGNOSIS — H353231 Exudative age-related macular degeneration, bilateral, with active choroidal neovascularization: Secondary | ICD-10-CM | POA: Diagnosis not present

## 2020-11-04 DIAGNOSIS — I10 Essential (primary) hypertension: Secondary | ICD-10-CM | POA: Diagnosis not present

## 2020-11-05 DIAGNOSIS — E039 Hypothyroidism, unspecified: Secondary | ICD-10-CM | POA: Diagnosis not present

## 2020-11-05 DIAGNOSIS — F329 Major depressive disorder, single episode, unspecified: Secondary | ICD-10-CM | POA: Diagnosis not present

## 2020-11-11 DIAGNOSIS — H34812 Central retinal vein occlusion, left eye, with macular edema: Secondary | ICD-10-CM | POA: Diagnosis not present

## 2020-11-11 DIAGNOSIS — H3582 Retinal ischemia: Secondary | ICD-10-CM | POA: Diagnosis not present

## 2020-11-11 DIAGNOSIS — H5789 Other specified disorders of eye and adnexa: Secondary | ICD-10-CM | POA: Diagnosis not present

## 2020-11-11 DIAGNOSIS — H353231 Exudative age-related macular degeneration, bilateral, with active choroidal neovascularization: Secondary | ICD-10-CM | POA: Diagnosis not present

## 2020-11-11 DIAGNOSIS — H35371 Puckering of macula, right eye: Secondary | ICD-10-CM | POA: Diagnosis not present

## 2020-11-18 DIAGNOSIS — H53131 Sudden visual loss, right eye: Secondary | ICD-10-CM | POA: Diagnosis not present

## 2020-11-18 DIAGNOSIS — H401121 Primary open-angle glaucoma, left eye, mild stage: Secondary | ICD-10-CM | POA: Diagnosis not present

## 2020-11-18 DIAGNOSIS — H401113 Primary open-angle glaucoma, right eye, severe stage: Secondary | ICD-10-CM | POA: Diagnosis not present

## 2020-11-25 DIAGNOSIS — H53412 Scotoma involving central area, left eye: Secondary | ICD-10-CM | POA: Diagnosis not present

## 2020-11-25 DIAGNOSIS — H353231 Exudative age-related macular degeneration, bilateral, with active choroidal neovascularization: Secondary | ICD-10-CM | POA: Diagnosis not present

## 2020-12-06 DIAGNOSIS — I10 Essential (primary) hypertension: Secondary | ICD-10-CM | POA: Diagnosis not present

## 2020-12-06 DIAGNOSIS — F329 Major depressive disorder, single episode, unspecified: Secondary | ICD-10-CM | POA: Diagnosis not present

## 2020-12-06 DIAGNOSIS — E039 Hypothyroidism, unspecified: Secondary | ICD-10-CM | POA: Diagnosis not present

## 2020-12-16 DIAGNOSIS — H35353 Cystoid macular degeneration, bilateral: Secondary | ICD-10-CM | POA: Diagnosis not present

## 2020-12-16 DIAGNOSIS — H3562 Retinal hemorrhage, left eye: Secondary | ICD-10-CM | POA: Diagnosis not present

## 2020-12-16 DIAGNOSIS — H34812 Central retinal vein occlusion, left eye, with macular edema: Secondary | ICD-10-CM | POA: Diagnosis not present

## 2020-12-16 DIAGNOSIS — H3582 Retinal ischemia: Secondary | ICD-10-CM | POA: Diagnosis not present

## 2020-12-16 DIAGNOSIS — H353231 Exudative age-related macular degeneration, bilateral, with active choroidal neovascularization: Secondary | ICD-10-CM | POA: Diagnosis not present

## 2020-12-16 DIAGNOSIS — H35373 Puckering of macula, bilateral: Secondary | ICD-10-CM | POA: Diagnosis not present

## 2020-12-16 DIAGNOSIS — H04123 Dry eye syndrome of bilateral lacrimal glands: Secondary | ICD-10-CM | POA: Diagnosis not present

## 2020-12-30 DIAGNOSIS — H401113 Primary open-angle glaucoma, right eye, severe stage: Secondary | ICD-10-CM | POA: Diagnosis not present

## 2020-12-30 DIAGNOSIS — H401121 Primary open-angle glaucoma, left eye, mild stage: Secondary | ICD-10-CM | POA: Diagnosis not present

## 2021-01-03 DIAGNOSIS — I10 Essential (primary) hypertension: Secondary | ICD-10-CM | POA: Diagnosis not present

## 2021-01-17 DIAGNOSIS — Z789 Other specified health status: Secondary | ICD-10-CM | POA: Diagnosis not present

## 2021-01-17 DIAGNOSIS — F419 Anxiety disorder, unspecified: Secondary | ICD-10-CM | POA: Diagnosis not present

## 2021-01-17 DIAGNOSIS — Z299 Encounter for prophylactic measures, unspecified: Secondary | ICD-10-CM | POA: Diagnosis not present

## 2021-01-17 DIAGNOSIS — I10 Essential (primary) hypertension: Secondary | ICD-10-CM | POA: Diagnosis not present

## 2021-01-19 DIAGNOSIS — Z125 Encounter for screening for malignant neoplasm of prostate: Secondary | ICD-10-CM | POA: Diagnosis not present

## 2021-01-19 DIAGNOSIS — Z1339 Encounter for screening examination for other mental health and behavioral disorders: Secondary | ICD-10-CM | POA: Diagnosis not present

## 2021-01-19 DIAGNOSIS — E7800 Pure hypercholesterolemia, unspecified: Secondary | ICD-10-CM | POA: Diagnosis not present

## 2021-01-19 DIAGNOSIS — I10 Essential (primary) hypertension: Secondary | ICD-10-CM | POA: Diagnosis not present

## 2021-01-19 DIAGNOSIS — R5383 Other fatigue: Secondary | ICD-10-CM | POA: Diagnosis not present

## 2021-01-19 DIAGNOSIS — Z7189 Other specified counseling: Secondary | ICD-10-CM | POA: Diagnosis not present

## 2021-01-19 DIAGNOSIS — Z1331 Encounter for screening for depression: Secondary | ICD-10-CM | POA: Diagnosis not present

## 2021-01-19 DIAGNOSIS — Z0000 Encounter for general adult medical examination without abnormal findings: Secondary | ICD-10-CM | POA: Diagnosis not present

## 2021-01-19 DIAGNOSIS — E349 Endocrine disorder, unspecified: Secondary | ICD-10-CM | POA: Diagnosis not present

## 2021-01-19 DIAGNOSIS — E039 Hypothyroidism, unspecified: Secondary | ICD-10-CM | POA: Diagnosis not present

## 2021-01-19 DIAGNOSIS — Z6830 Body mass index (BMI) 30.0-30.9, adult: Secondary | ICD-10-CM | POA: Diagnosis not present

## 2021-01-19 DIAGNOSIS — Z79899 Other long term (current) drug therapy: Secondary | ICD-10-CM | POA: Diagnosis not present

## 2021-01-19 DIAGNOSIS — Z299 Encounter for prophylactic measures, unspecified: Secondary | ICD-10-CM | POA: Diagnosis not present

## 2021-01-27 DIAGNOSIS — Z888 Allergy status to other drugs, medicaments and biological substances status: Secondary | ICD-10-CM | POA: Diagnosis not present

## 2021-01-27 DIAGNOSIS — H401113 Primary open-angle glaucoma, right eye, severe stage: Secondary | ICD-10-CM | POA: Diagnosis not present

## 2021-01-27 DIAGNOSIS — Z961 Presence of intraocular lens: Secondary | ICD-10-CM | POA: Diagnosis not present

## 2021-01-27 DIAGNOSIS — H538 Other visual disturbances: Secondary | ICD-10-CM | POA: Diagnosis not present

## 2021-01-27 DIAGNOSIS — H353231 Exudative age-related macular degeneration, bilateral, with active choroidal neovascularization: Secondary | ICD-10-CM | POA: Diagnosis not present

## 2021-01-27 DIAGNOSIS — H5789 Other specified disorders of eye and adnexa: Secondary | ICD-10-CM | POA: Diagnosis not present

## 2021-01-27 DIAGNOSIS — Z8673 Personal history of transient ischemic attack (TIA), and cerebral infarction without residual deficits: Secondary | ICD-10-CM | POA: Diagnosis not present

## 2021-01-27 DIAGNOSIS — H401121 Primary open-angle glaucoma, left eye, mild stage: Secondary | ICD-10-CM | POA: Diagnosis not present

## 2021-02-03 DIAGNOSIS — I10 Essential (primary) hypertension: Secondary | ICD-10-CM | POA: Diagnosis not present

## 2021-02-05 ENCOUNTER — Other Ambulatory Visit: Payer: Self-pay

## 2021-02-05 ENCOUNTER — Inpatient Hospital Stay (HOSPITAL_COMMUNITY)
Admission: EM | Admit: 2021-02-05 | Discharge: 2021-02-06 | DRG: 087 | Disposition: A | Discharge status: Home / Self Care | Payer: Medicare Other | Attending: General Surgery | Admitting: General Surgery

## 2021-02-05 ENCOUNTER — Encounter (HOSPITAL_COMMUNITY): Payer: Self-pay | Admitting: *Deleted

## 2021-02-05 ENCOUNTER — Emergency Department (HOSPITAL_COMMUNITY): Payer: Medicare Other

## 2021-02-05 DIAGNOSIS — R9431 Abnormal electrocardiogram [ECG] [EKG]: Secondary | ICD-10-CM | POA: Diagnosis not present

## 2021-02-05 DIAGNOSIS — Z20822 Contact with and (suspected) exposure to covid-19: Secondary | ICD-10-CM | POA: Diagnosis present

## 2021-02-05 DIAGNOSIS — E039 Hypothyroidism, unspecified: Secondary | ICD-10-CM | POA: Diagnosis present

## 2021-02-05 DIAGNOSIS — W010XXA Fall on same level from slipping, tripping and stumbling without subsequent striking against object, initial encounter: Secondary | ICD-10-CM | POA: Diagnosis present

## 2021-02-05 DIAGNOSIS — Z23 Encounter for immunization: Secondary | ICD-10-CM

## 2021-02-05 DIAGNOSIS — Z809 Family history of malignant neoplasm, unspecified: Secondary | ICD-10-CM | POA: Diagnosis not present

## 2021-02-05 DIAGNOSIS — S0990XA Unspecified injury of head, initial encounter: Secondary | ICD-10-CM | POA: Diagnosis not present

## 2021-02-05 DIAGNOSIS — Z79890 Hormone replacement therapy (postmenopausal): Secondary | ICD-10-CM

## 2021-02-05 DIAGNOSIS — Z043 Encounter for examination and observation following other accident: Secondary | ICD-10-CM | POA: Diagnosis not present

## 2021-02-05 DIAGNOSIS — Z833 Family history of diabetes mellitus: Secondary | ICD-10-CM | POA: Diagnosis not present

## 2021-02-05 DIAGNOSIS — M79642 Pain in left hand: Secondary | ICD-10-CM | POA: Diagnosis not present

## 2021-02-05 DIAGNOSIS — G319 Degenerative disease of nervous system, unspecified: Secondary | ICD-10-CM | POA: Diagnosis not present

## 2021-02-05 DIAGNOSIS — W19XXXA Unspecified fall, initial encounter: Secondary | ICD-10-CM | POA: Insufficient documentation

## 2021-02-05 DIAGNOSIS — S60512A Abrasion of left hand, initial encounter: Secondary | ICD-10-CM | POA: Insufficient documentation

## 2021-02-05 DIAGNOSIS — F17290 Nicotine dependence, other tobacco product, uncomplicated: Secondary | ICD-10-CM | POA: Diagnosis present

## 2021-02-05 DIAGNOSIS — Y92017 Garden or yard in single-family (private) house as the place of occurrence of the external cause: Secondary | ICD-10-CM

## 2021-02-05 DIAGNOSIS — S022XXA Fracture of nasal bones, initial encounter for closed fracture: Secondary | ICD-10-CM | POA: Diagnosis present

## 2021-02-05 DIAGNOSIS — S0993XA Unspecified injury of face, initial encounter: Secondary | ICD-10-CM | POA: Diagnosis not present

## 2021-02-05 DIAGNOSIS — S61412A Laceration without foreign body of left hand, initial encounter: Secondary | ICD-10-CM | POA: Diagnosis not present

## 2021-02-05 DIAGNOSIS — S60417A Abrasion of left little finger, initial encounter: Secondary | ICD-10-CM | POA: Diagnosis present

## 2021-02-05 DIAGNOSIS — Z79899 Other long term (current) drug therapy: Secondary | ICD-10-CM | POA: Diagnosis not present

## 2021-02-05 DIAGNOSIS — S06890A Other specified intracranial injury without loss of consciousness, initial encounter: Secondary | ICD-10-CM | POA: Diagnosis not present

## 2021-02-05 DIAGNOSIS — S6992XA Unspecified injury of left wrist, hand and finger(s), initial encounter: Secondary | ICD-10-CM | POA: Diagnosis not present

## 2021-02-05 DIAGNOSIS — S066X0A Traumatic subarachnoid hemorrhage without loss of consciousness, initial encounter: Principal | ICD-10-CM | POA: Diagnosis present

## 2021-02-05 DIAGNOSIS — I6523 Occlusion and stenosis of bilateral carotid arteries: Secondary | ICD-10-CM | POA: Diagnosis not present

## 2021-02-05 DIAGNOSIS — I629 Nontraumatic intracranial hemorrhage, unspecified: Secondary | ICD-10-CM

## 2021-02-05 MED ORDER — ONDANSETRON 4 MG PO TBDP
4.0000 mg | ORAL_TABLET | Freq: Four times a day (QID) | ORAL | Status: DC | PRN
Start: 2021-02-05 — End: 2021-02-07

## 2021-02-05 MED ORDER — PROCHLORPERAZINE EDISYLATE 10 MG/2ML IJ SOLN
5.0000 mg | Freq: Four times a day (QID) | INTRAMUSCULAR | Status: DC | PRN
Start: 2021-02-05 — End: 2021-02-07

## 2021-02-05 MED ORDER — PROCHLORPERAZINE MALEATE 10 MG PO TABS
10.0000 mg | ORAL_TABLET | Freq: Four times a day (QID) | ORAL | Status: DC | PRN
  Filled 2021-02-05: qty 1

## 2021-02-05 MED ORDER — MORPHINE SULFATE (PF) 2 MG/ML IV SOLN: 2.0000 mg | INTRAVENOUS | Status: DC | PRN

## 2021-02-05 MED ORDER — LIDOCAINE HCL (PF) 1 % IJ SOLN
5.0000 mL | Freq: Once | INTRAMUSCULAR | Status: AC
  Administered 2021-02-06: 5 mL
  Filled 2021-02-05: qty 5

## 2021-02-05 MED ORDER — NICOTINE 14 MG/24HR TD PT24
14.0000 mg | MEDICATED_PATCH | Freq: Every day | TRANSDERMAL | Status: DC
  Filled 2021-02-05: qty 1

## 2021-02-05 MED ORDER — ONDANSETRON HCL 4 MG/2ML IJ SOLN: 4.0000 mg | Freq: Four times a day (QID) | INTRAMUSCULAR | Status: DC | PRN

## 2021-02-05 MED ORDER — TETANUS-DIPHTH-ACELL PERTUSSIS 5-2.5-18.5 LF-MCG/0.5 IM SUSY
0.5000 mL | PREFILLED_SYRINGE | Freq: Once | INTRAMUSCULAR | Status: AC
  Administered 2021-02-05: 0.5 mL via INTRAMUSCULAR
  Filled 2021-02-05: qty 0.5

## 2021-02-05 MED ORDER — LACTATED RINGERS IV SOLN: INTRAVENOUS | Status: DC

## 2021-02-05 MED ORDER — ACETAMINOPHEN 325 MG PO TABS: 650.0000 mg | ORAL_TABLET | ORAL | Status: DC | PRN

## 2021-02-05 MED ORDER — BACITRACIN ZINC 500 UNIT/GM EX OINT
TOPICAL_OINTMENT | Freq: Two times a day (BID) | CUTANEOUS | Status: DC
  Administered 2021-02-06: 1 via TOPICAL
  Filled 2021-02-05: qty 28.4

## 2021-02-05 MED ORDER — CEFAZOLIN SODIUM-DEXTROSE 1-4 GM/50ML-% IV SOLN
1.0000 g | Freq: Once | INTRAVENOUS | Status: AC
  Administered 2021-02-06: 1 g via INTRAVENOUS
  Filled 2021-02-05 (×2): qty 50

## 2021-02-05 MED ORDER — HYDROCODONE-ACETAMINOPHEN 5-325 MG PO TABS: 1.0000 | ORAL_TABLET | ORAL | Status: DC | PRN

## 2021-02-05 NOTE — ED Notes (Signed)
Admitting doctor at  The bedside 

## 2021-02-05 NOTE — H&P (Signed)
   Admitting Physician: Wilder  Service: Trauma Surgery  CC: Fall  Subjective   Mechanism of Injury: Gilbert Rangel is an 75 y.o. male who presented as a trauma consult after tripping and falling in his driveway and injuring his face and left hand.   Past Medical History:  Diagnosis Date  . Hypothyroidism   . Thyroid disease     Past Surgical History:  Procedure Laterality Date  . CHOLECYSTECTOMY    . KNEE ARTHROSCOPY W/ MENISCAL REPAIR    . LUMBAR LAMINECTOMY/DECOMPRESSION MICRODISCECTOMY N/A 09/29/2015   Procedure: MICRO LUMBER DECOMPRESSION L5-S1 AND L4-5 ON RIGHT;  Surgeon: Susa Day, MD;  Location: [facility] ORS;  Service: Orthopedics;  Laterality: N/A;  . REPAIR / REINSERT BICEPS TENDON AT ELBOW    . ROTATOR CUFF REPAIR    . TONSILLECTOMY      Family History  Problem Relation Age of Onset  . Diabetes Mother   . Cancer Father     Social:  reports that he has never smoked. He uses smokeless tobacco. He reports current alcohol use. He reports that he does not use drugs.  Allergies: No Known Allergies  Medications: Current Outpatient Medications  Medication Instructions  . docusate sodium (COLACE) 100 mg, Oral, 2 times daily PRN  . gabapentin (NEURONTIN) 300 mg, Oral, 2 times daily  . HYDROmorphone (DILAUDID) 2 mg, Oral, Every 4 hours PRN  . levothyroxine (SYNTHROID) 75 mcg, Oral, Daily before breakfast  . nicotine (NICODERM CQ - DOSED IN MG/24 HOURS) 21 mg, Transdermal, Daily    Objective   Primary Survey: Blood pressure (!) 148/77, pulse 69, temperature 98.5 F (36.9 C), temperature source Oral, resp. rate (!) 22, height 5' 10.5" (1.791 m), weight 89.4 kg, SpO2 98 %. Airway: Patent, protecting airway Breathing: Bilateral breath sounds, breathing spontaneously Circulation: Stable, Palpable peripheral pulses Disability: Moving all extremities, GCS 15 Environment/Exposure: Warm, dry  Secondary Survey: Head: abrasion over nose Neck: Full range  of motion without pain, no midline tenderness c-collar removed Chest: Bilateral breath sounds, chest wall stable Abdomen: Soft, non-tender, non-distended Upper Extremities: full range of motion without pain, neurovascular intact, hand abrasions on the left bandaged Lower extremities: Strength and sensation intact, palpable peripheral pulses Back: No step offs or deformities, atraumatic Rectal: deferred Psych: Normal mood and affect  CBC, BMP, PT / INR pending  No results found for this or any previous visit (from the past 24 hour(s)).   Imaging Orders     CT Head Wo Contrast     CT Maxillofacial Wo Contrast     CT Cervical Spine Wo Contrast     DG Hand Complete Left   Assessment and Plan   Gilbert Rangel is an 75 y.o. male who presented as a trauma consult after a fall from standing.  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 Sequential Compression Devices ID - None given in the trauma bay.  Dispo - Med-Surg [place], MD  [facility] Use [email or web address] to contact on call provider

## 2021-02-05 NOTE — ED Provider Notes (Signed)
[facility] EMERGENCY DEPARTMENT Provider Note   CSN: [id number] Arrival date & time: 02/05/21  2109     History Chief Complaint  Patient presents with  . Fall    Gilbert Rangel is a 75 y.o. male.  Patient presents chief complaint of ground-level fall and facial injury.  He states he lost his balance and his foot caught in a hole in the ground and fell outside today while wearing his glasses.  Sustained abrasions to his nose and laceration to his left hand.  Complaining of left hand pain and facial pain.  Denies loss of consciousness denies neck or back pain.  No recent fevers vomiting cough or diarrhea.        Past Medical History:  Diagnosis Date  . Hypothyroidism   . Thyroid disease     Patient Active Problem List   Diagnosis Date Noted  . Intracranial hemorrhage ([place]) 02/05/2021  . HNP (herniated nucleus pulposus), lumbar 09/29/2015  . Spinal stenosis of lumbar region 09/29/2015  . Low back pain radiating to right leg 08/30/2015  . Acute encephalopathy 08/30/2015  . Hypothyroidism 08/30/2015  . URI (upper respiratory infection) 08/30/2015  . Acute viral syndrome 08/30/2015  . Transaminitis   . Right-sided low back pain with right-sided sciatica   . Tobacco abuse   . EKG abnormality 08/28/2015  . CAP (community acquired pneumonia) 08/28/2015  . Sepsis due to pneumonia ([place]) 08/28/2015  . Altered mental status 08/27/2015    Past Surgical History:  Procedure Laterality Date  . CHOLECYSTECTOMY    . KNEE ARTHROSCOPY W/ MENISCAL REPAIR    . LUMBAR LAMINECTOMY/DECOMPRESSION MICRODISCECTOMY N/A 09/29/2015   Procedure: MICRO LUMBER DECOMPRESSION L5-S1 AND L4-5 ON RIGHT;  Surgeon: Susa Day, MD;  Location: [facility] ORS;  Service: Orthopedics;  Laterality: N/A;  . REPAIR / REINSERT BICEPS TENDON AT ELBOW    . ROTATOR CUFF REPAIR    . TONSILLECTOMY         Family History  Problem Relation Age of Onset  . Diabetes Mother   . Cancer Father      Social History   Tobacco Use  . Smoking status: Never Smoker  . Smokeless tobacco: Current User  Substance Use Topics  . Alcohol use: Yes    Alcohol/week: 0.0 standard drinks    Comment: occasionally  . Drug use: No    Home Medications Prior to Admission medications   Medication Sig Start Date End Date Taking? Authorizing Provider  docusate sodium (COLACE) 100 MG capsule Take 1 capsule (100 mg total) by mouth 2 (two) times daily as needed for mild constipation. 09/29/15   Susa Day, MD  gabapentin (NEURONTIN) 300 MG capsule Take 1 capsule (300 mg total) by mouth 2 (two) times daily. Patient taking differently: Take 600 mg by mouth every 6 (six) hours as needed (pain).  08/25/15   Horton, Barbette Hair, MD  HYDROmorphone (DILAUDID) 4 MG tablet Take 0.5 tablets (2 mg total) by mouth every 4 (four) hours as needed for severe pain. 09/29/15   Susa Day, MD  levothyroxine (SYNTHROID, LEVOTHROID) 75 MCG tablet Take 75 mcg by mouth daily before breakfast.    [provider]  nicotine (NICODERM CQ - DOSED IN MG/24 HOURS) 21 mg/24hr patch Place 1 patch (21 mg total) onto the skin daily. 08/30/15   Dhungel, Flonnie Overman, MD    Allergies    Patient has no known allergies.  Review of Systems   Review of Systems  Constitutional: Negative for fever.  HENT: Negative for ear pain and sore throat.   Eyes: Negative for pain.  Respiratory: Negative for cough.   Cardiovascular: Negative for chest pain.  Gastrointestinal: Negative for abdominal pain.  Genitourinary: Negative for flank pain.  Musculoskeletal: Negative for back pain.  Skin: Negative for color change and rash.  Neurological: Negative for syncope.  All other systems reviewed and are negative.   Physical Exam Updated Vital Signs BP (!) 148/77   Pulse 69   Temp 98.5 F (36.9 C) (Oral)   Resp (!) 22   Ht 5' 10.5" (1.791 m)   Wt 89.4 kg   SpO2 98%   BMI 27.87 kg/m   Physical Exam Constitutional:       General: He is not in acute distress.    Appearance: He is well-developed.  HENT:     Head: Normocephalic.     Comments: Swelling tenderness and abrasions to bridge of nose.    Nose:     Comments: No saddle hematoma noted. Eyes:     Extraocular Movements: Extraocular movements intact.  Cardiovascular:     Rate and Rhythm: Normal rate.  Pulmonary:     Effort: Pulmonary effort is normal.  Musculoskeletal:     Comments: Tenderness to the left hand fifth digit.  Moderate swelling present.  Neurovascularly intact otherwise.  Skin:    Coloration: Skin is not jaundiced.     Comments: Abrasions to the bridge of the nose.  2 cm laceration to left hand ulnar aspect.  Neurological:     General: No focal deficit present.     Mental Status: He is alert and oriented to person, place, and time. Mental status is at baseline.     ED Results / Procedures / Treatments   Labs (all labs ordered are listed, but only abnormal results are displayed) Labs Reviewed  RESP PANEL BY RT-PCR (FLU A&B, COVID) ARPGX2  CBC WITH DIFFERENTIAL/PLATELET  BASIC METABOLIC PANEL  PROTIME-INR    EKG EKG Interpretation  Date/Time:  Saturday February 05 2021 21:15:40 EDT Ventricular Rate:  63 PR Interval:  208 QRS Duration: 133 QT Interval:  416 QTC Calculation: 426 R Axis:   -60 Text Interpretation: Sinus arrhythmia Nonspecific IVCD with LAD Left ventricular hypertrophy Confirmed by Thamas Jaegers ([id number]) on 02/05/2021 9:30:25 PM   Radiology CT Head Wo Contrast  Result Date: 02/05/2021 CLINICAL DATA:  Head trauma, fall EXAM: CT HEAD WITHOUT CONTRAST TECHNIQUE: Contiguous axial images were obtained from the base of the skull through the vertex without intravenous contrast. COMPARISON:  08/27/2015 FINDINGS: Brain: Hyperdense area noted posteriorly in the high left parietal region concerning for subarachnoid hemorrhage. No mass effect or midline shift. There is atrophy and chronic small vessel disease changes. Vascular:  No hyperdense vessel or unexpected calcification. Skull: No acute calvarial abnormality. Sinuses/Orbits: Paranasal sinuses clear. Postoperative changes in the right orbit. Other: Comminuted nasal bone fractures. IMPRESSION: Area of hemorrhage in the high left posterior parietal region, likely subarachnoid. Atrophy, chronic small vessel disease. Multiple nasal bone fractures. These results were called by telephone at the time of interpretation on 02/05/2021 at 10:05 pm to provider [facility] , who verbally acknowledged these results. Electronically Signed   By: Rolm Baptise M.D.   On: 02/05/2021 22:06   CT Cervical Spine Wo Contrast  Result Date: 02/05/2021 CLINICAL DATA:  Fall EXAM: CT CERVICAL SPINE WITHOUT CONTRAST TECHNIQUE: Multidetector CT imaging of the cervical spine was performed without intravenous contrast. Multiplanar CT image reconstructions were also generated. COMPARISON:  None.  FINDINGS: Alignment: Normal Skull base and vertebrae: No acute fracture. No primary bone lesion or focal pathologic process. Soft tissues and spinal canal: No prevertebral fluid or swelling. No visible canal hematoma. Disc levels: Disc spaces maintained. Diffuse degenerative facet disease bilaterally. Upper chest: No acute findings Other: None IMPRESSION: Spondylosis.  No acute bony abnormality. Electronically Signed   By: Rolm Baptise M.D.   On: 02/05/2021 22:12   DG Hand Complete Left  Result Date: 02/05/2021 CLINICAL DATA:  Left hand pain after a fall.  Trip and fall injury. EXAM: LEFT HAND - COMPLETE 3+ VIEW COMPARISON:  None. FINDINGS: Mild degenerative changes in the interphalangeal joints, first carpometacarpal joint, STT joints, and radiocarpal joint. No evidence of acute fracture or dislocation. No focal bone lesion or bone destruction. Vascular calcifications. IMPRESSION: Mild degenerative changes in the left hand and wrist. No acute bony abnormalities. Electronically Signed   By: Lucienne Capers M.D.   On:  02/05/2021 22:04   CT Maxillofacial Wo Contrast  Result Date: 02/05/2021 CLINICAL DATA:  Fall, facial trauma EXAM: CT MAXILLOFACIAL WITHOUT CONTRAST TECHNIQUE: Multidetector CT imaging of the maxillofacial structures was performed. Multiplanar CT image reconstructions were also generated. COMPARISON:  None. FINDINGS: Osseous: Multiple nasal bone fractures, mildly displaced. Buckling of the nasal septum, likely fracture. No additional facial fracture. Orbits: Negative. No traumatic or inflammatory finding. Sinuses: No air-fluid levels. Soft tissues: Soft tissue swelling over the nose and upper lip region. Limited intracranial: See head CT report IMPRESSION: Multiple displaced nasal bone fractures. Electronically Signed   By: Rolm Baptise M.D.   On: 02/05/2021 22:10    Procedures Procedures   Medications Ordered in ED Medications  lidocaine (PF) (XYLOCAINE) 1 % injection 5 mL (has no administration in time range)  lactated ringers infusion (has no administration in time range)  acetaminophen (TYLENOL) tablet 650 mg (has no administration in time range)  HYDROcodone-acetaminophen (NORCO/VICODIN) 5-325 MG per tablet 1 tablet (has no administration in time range)  morphine 2 MG/ML injection 2 mg (has no administration in time range)  ondansetron (ZOFRAN-ODT) disintegrating tablet 4 mg (has no administration in time range)    Or  ondansetron (ZOFRAN) injection 4 mg (has no administration in time range)  prochlorperazine (COMPAZINE) tablet 10 mg (has no administration in time range)    Or  prochlorperazine (COMPAZINE) injection 5-10 mg (has no administration in time range)  ceFAZolin (ANCEF) IVPB 1 g/50 mL premix (has no administration in time range)  Tdap (BOOSTRIX) injection 0.5 mL (0.5 mLs Intramuscular Given 02/05/21 2245)    ED Course  I have reviewed the triage vital signs and the nursing notes.  Pertinent labs & imaging results that were available during my care of the patient were  reviewed by me and considered in my medical decision making (see chart for details).    MDM Rules/Calculators/A&P                          CT imaging pursued concerning for subarachnoid hemorrhage.  No midline shift per radiologist.  No C-spine fracture or hand fracture noted.  Comminuted nasal bone fracture noted.  Patient given a gram of Ancef and tetanus for his abrasions and nasal fracture.  Consultation with neurosurgery, recommending trauma surgery admission.  Case discussed with trauma for admission.  Final Clinical Impression(s) / ED Diagnoses Final diagnoses:  Subarachnoid hemorrhage following injury, no loss of consciousness, initial encounter ([place])  Closed fracture of nasal bone, initial encounter  Laceration  of left hand without foreign body, initial encounter    Rx / DC Orders ED Discharge Orders    None       Luna Fuse, MD 02/05/21 [phone]

## 2021-02-05 NOTE — ED Notes (Signed)
Trauma at  The bedside

## 2021-02-05 NOTE — Progress Notes (Signed)
75 year old who fell on his driveway this evening presented to the ED tonight. We were called regarding CT head which showed what seems to be a tSAH in the left posterior parietal region. No mass effect or midline shift noted. He also sustained a nasal fracture.patient is not on any blood thinners. No neurosurgical intervention is warranted.  Would recommend obs and repeat head CT. Formal note to follow.

## 2021-02-05 NOTE — ED Triage Notes (Signed)
The pt arrived by gems from home the pt tripped and fell in his driveway facial abrasions nose lt hand  And fingers  No loc takes aspirin only

## 2021-02-06 ENCOUNTER — Inpatient Hospital Stay (HOSPITAL_COMMUNITY): Payer: Medicare Other

## 2021-02-06 DIAGNOSIS — S022XXA Fracture of nasal bones, initial encounter for closed fracture: Secondary | ICD-10-CM | POA: Diagnosis not present

## 2021-02-06 DIAGNOSIS — W010XXA Fall on same level from slipping, tripping and stumbling without subsequent striking against object, initial encounter: Secondary | ICD-10-CM | POA: Diagnosis present

## 2021-02-06 DIAGNOSIS — S0993XA Unspecified injury of face, initial encounter: Secondary | ICD-10-CM | POA: Diagnosis not present

## 2021-02-06 DIAGNOSIS — S06890A Other specified intracranial injury without loss of consciousness, initial encounter: Secondary | ICD-10-CM | POA: Diagnosis not present

## 2021-02-06 DIAGNOSIS — Y92017 Garden or yard in single-family (private) house as the place of occurrence of the external cause: Secondary | ICD-10-CM | POA: Diagnosis not present

## 2021-02-06 DIAGNOSIS — S066X0A Traumatic subarachnoid hemorrhage without loss of consciousness, initial encounter: Secondary | ICD-10-CM | POA: Diagnosis not present

## 2021-02-06 DIAGNOSIS — Z23 Encounter for immunization: Secondary | ICD-10-CM | POA: Diagnosis not present

## 2021-02-06 LAB — RESP PANEL BY RT-PCR (FLU A&B, COVID) ARPGX2
Influenza A by PCR: NEGATIVE
Influenza B by PCR: NEGATIVE
SARS Coronavirus 2 by RT PCR: NEGATIVE

## 2021-02-06 LAB — CBC WITH DIFFERENTIAL/PLATELET
Abs Immature Granulocytes: 0.11 10*3/uL — ABNORMAL HIGH (ref 0.00–0.07)
Basophils Absolute: 0 10*3/uL (ref 0.0–0.1)
Basophils Relative: 0 %
Eosinophils Absolute: 0.2 10*3/uL (ref 0.0–0.5)
Eosinophils Relative: 2 %
HCT: 41.8 % (ref 39.0–52.0)
Hemoglobin: 14.2 g/dL (ref 13.0–17.0)
Immature Granulocytes: 1 %
Lymphocytes Relative: 20 %
Lymphs Abs: 1.9 10*3/uL (ref 0.7–4.0)
MCH: 33.5 pg (ref 26.0–34.0)
MCHC: 34 g/dL (ref 30.0–36.0)
MCV: 98.6 fL (ref 80.0–100.0)
Monocytes Absolute: 0.7 10*3/uL (ref 0.1–1.0)
Monocytes Relative: 7 %
Neutro Abs: 6.6 10*3/uL (ref 1.7–7.7)
Neutrophils Relative %: 70 %
Platelets: 193 10*3/uL (ref 150–400)
RBC: 4.24 MIL/uL (ref 4.22–5.81)
RDW: 13.2 % (ref 11.5–15.5)
WBC: 9.4 10*3/uL (ref 4.0–10.5)
nRBC: 0 % (ref 0.0–0.2)

## 2021-02-06 LAB — PROTIME-INR
INR: 1 (ref 0.8–1.2)
Prothrombin Time: 13.1 seconds (ref 11.4–15.2)

## 2021-02-06 LAB — BASIC METABOLIC PANEL
Anion gap: 10 (ref 5–15)
BUN: 10 mg/dL (ref 8–23)
CO2: 26 mmol/L (ref 22–32)
Calcium: 8.7 mg/dL — ABNORMAL LOW (ref 8.9–10.3)
Chloride: 103 mmol/L (ref 98–111)
Creatinine, Ser: 1.08 mg/dL (ref 0.61–1.24)
GFR, Estimated: >60 mL/min (ref >60)
Glucose, Bld: 95 mg/dL (ref 70–99)
Potassium: 3.6 mmol/L (ref 3.5–5.1)
Sodium: 139 mmol/L (ref 135–145)

## 2021-02-06 MED ORDER — IRBESARTAN 75 MG PO TABS
37.5000 mg | ORAL_TABLET | Freq: Every day | ORAL | Status: DC
  Administered 2021-02-06: 37.5 mg via ORAL
  Filled 2021-02-06: qty 0.5

## 2021-02-06 MED ORDER — CARBOXYMETHYLCELLULOSE SODIUM 0.5 % OP SOLN: 1.0000 [drp] | Freq: Three times a day (TID) | OPHTHALMIC | Status: DC | PRN

## 2021-02-06 MED ORDER — ALPRAZOLAM 0.5 MG PO TABS: 0.5000 mg | ORAL_TABLET | Freq: Two times a day (BID) | ORAL | Status: DC | PRN

## 2021-02-06 MED ORDER — OXYCODONE HCL 5 MG PO TABS
5.0000 mg | ORAL_TABLET | Freq: Four times a day (QID) | ORAL | Status: DC | PRN
  Administered 2021-02-06: 5 mg via ORAL
  Filled 2021-02-06: qty 1

## 2021-02-06 MED ORDER — LEVETIRACETAM 500 MG PO TABS: 500.0000 mg | ORAL_TABLET | Freq: Two times a day (BID) | ORAL | 0 refills | Status: DC

## 2021-02-06 MED ORDER — LATANOPROST 0.005 % OP SOLN
1.0000 [drp] | Freq: Every day | OPHTHALMIC | Status: DC
  Filled 2021-02-06: qty 2.5

## 2021-02-06 MED ORDER — DORZOLAMIDE HCL-TIMOLOL MAL 2-0.5 % OP SOLN
1.0000 [drp] | Freq: Two times a day (BID) | OPHTHALMIC | Status: DC
  Filled 2021-02-06: qty 10

## 2021-02-06 MED ORDER — LEVOTHYROXINE SODIUM 88 MCG PO TABS
88.0000 ug | ORAL_TABLET | Freq: Every day | ORAL | Status: DC
  Administered 2021-02-06: 88 ug via ORAL
  Filled 2021-02-06: qty 1

## 2021-02-06 MED ORDER — ACETAMINOPHEN 325 MG PO TABS: 650.0000 mg | ORAL_TABLET | ORAL | Status: DC | PRN

## 2021-02-06 MED ORDER — FLUOXETINE HCL 20 MG PO CAPS
40.0000 mg | ORAL_CAPSULE | Freq: Every day | ORAL | Status: DC
  Administered 2021-02-06: 40 mg via ORAL
  Filled 2021-02-06: qty 2

## 2021-02-06 MED ORDER — OXYCODONE HCL 5 MG PO TABS: 5.0000 mg | ORAL_TABLET | Freq: Four times a day (QID) | ORAL | 0 refills | Status: DC | PRN

## 2021-02-06 MED ORDER — POLYVINYL ALCOHOL 1.4 % OP SOLN
1.0000 [drp] | Freq: Three times a day (TID) | OPHTHALMIC | Status: DC | PRN
  Filled 2021-02-06: qty 15

## 2021-02-06 MED ORDER — BACITRACIN ZINC 500 UNIT/GM EX OINT: TOPICAL_OINTMENT | Freq: Two times a day (BID) | CUTANEOUS | 0 refills | Status: DC

## 2021-02-06 MED ORDER — LEVETIRACETAM 500 MG PO TABS
500.0000 mg | ORAL_TABLET | Freq: Two times a day (BID) | ORAL | Status: DC
  Administered 2021-02-06: 500 mg via ORAL
  Filled 2021-02-06: qty 1

## 2021-02-06 NOTE — ED Notes (Signed)
Report given to claire rn 3w rm 15

## 2021-02-06 NOTE — Plan of Care (Signed)

## 2021-02-06 NOTE — Progress Notes (Signed)
Discharged to home after IV access removed and discharge instructions given.

## 2021-02-06 NOTE — Progress Notes (Signed)
Subjective: C/o some fullness and edema in his face.  Denies HA.  Admits to some vision trouble at baseline, but nothing more than that currently.  Hasn't been OOB really since admission.  Has been NPO.  No pain anywhere else on his body  ROS: See above, otherwise other systems negative  Objective: Vital signs in last 24 hours: Temp:  [97.4 F (36.3 C)-98.5 F (36.9 C)] 98 F (36.7 C) (04/03 0852) Pulse Rate:  [64-76] 68 (04/03 0852) Resp:  [14-22] 18 (04/03 0852) BP: (129-148)/(64-113) 137/77 (04/03 0852) SpO2:  [97 %-99 %] 97 % (04/03 0852) Weight:  [89.4 kg] 89.4 kg (04/02 2116)    Intake/Output from previous day: No intake/output data recorded. Intake/Output this shift: Total I/O In: -  Out: 200 [Urine:200]  PE: Gen: NAD HEENT: multiple abrasions to his face with ecchymosis from chin to forehead particularly left to mid face.  PERRL.  Left eyelid edematous. Neck: trachea midline Heart: regular Lungs: CTAB Abd: soft, NT, ND Ext: left 5th finger wrapped and clean with no bleeding noted.  Otherwise MAE with no issues Neuro: grossly intact, sensation normal throughout Psych: A&Ox3  Lab Results:  Recent Labs    02/06/21 0040  WBC 9.4  HGB 14.2  HCT 41.8  PLT 193   BMET Recent Labs    02/06/21 0040  NA 139  K 3.6  CL 103  CO2 26  GLUCOSE 95  BUN 10  CREATININE 1.08  CALCIUM 8.7*   PT/INR Recent Labs    02/06/21 0040  LABPROT 13.1  INR 1.0   CMP     Component Value Date/Time   NA 139 02/06/2021 0040   K 3.6 02/06/2021 0040   CL 103 02/06/2021 0040   CO2 26 02/06/2021 0040   GLUCOSE 95 02/06/2021 0040   BUN 10 02/06/2021 0040   CREATININE 1.08 02/06/2021 0040   CALCIUM 8.7 (L) 02/06/2021 0040   PROT 6.3 (L) 08/29/2015 0549   ALBUMIN 3.0 (L) 08/29/2015 0549   AST 22 08/29/2015 0549   ALT 87 (H) 08/29/2015 0549   ALKPHOS 93 08/29/2015 0549   BILITOT 0.7 08/29/2015 0549   GFRNONAA >60 02/06/2021 0040   GFRAA >60 09/17/2015 1130    Lipase  No results found for: LIPASE     Studies/Results: CT Head Wo Contrast  Result Date: 02/05/2021 CLINICAL DATA:  Head trauma, fall EXAM: CT HEAD WITHOUT CONTRAST TECHNIQUE: Contiguous axial images were obtained from the base of the skull through the vertex without intravenous contrast. COMPARISON:  08/27/2015 FINDINGS: Brain: Hyperdense area noted posteriorly in the high left parietal region concerning for subarachnoid hemorrhage. No mass effect or midline shift. There is atrophy and chronic small vessel disease changes. Vascular: No hyperdense vessel or unexpected calcification. Skull: No acute calvarial abnormality. Sinuses/Orbits: Paranasal sinuses clear. Postoperative changes in the right orbit. Other: Comminuted nasal bone fractures. IMPRESSION: Area of hemorrhage in the high left posterior parietal region, likely subarachnoid. Atrophy, chronic small vessel disease. Multiple nasal bone fractures. These results were called by telephone at the time of interpretation on 02/05/2021 at 10:05 pm to provider [facility] , who verbally acknowledged these results. Electronically Signed   By: Rolm Baptise M.D.   On: 02/05/2021 22:06   CT Cervical Spine Wo Contrast  Result Date: 02/05/2021 CLINICAL DATA:  Fall EXAM: CT CERVICAL SPINE WITHOUT CONTRAST TECHNIQUE: Multidetector CT imaging of the cervical spine was performed without intravenous contrast. Multiplanar CT image reconstructions were also generated.  COMPARISON:  None. FINDINGS: Alignment: Normal Skull base and vertebrae: No acute fracture. No primary bone lesion or focal pathologic process. Soft tissues and spinal canal: No prevertebral fluid or swelling. No visible canal hematoma. Disc levels: Disc spaces maintained. Diffuse degenerative facet disease bilaterally. Upper chest: No acute findings Other: None IMPRESSION: Spondylosis.  No acute bony abnormality. Electronically Signed   By: Rolm Baptise M.D.   On: 02/05/2021 22:12   DG Hand  Complete Left  Result Date: 02/05/2021 CLINICAL DATA:  Left hand pain after a fall.  Trip and fall injury. EXAM: LEFT HAND - COMPLETE 3+ VIEW COMPARISON:  None. FINDINGS: Mild degenerative changes in the interphalangeal joints, first carpometacarpal joint, STT joints, and radiocarpal joint. No evidence of acute fracture or dislocation. No focal bone lesion or bone destruction. Vascular calcifications. IMPRESSION: Mild degenerative changes in the left hand and wrist. No acute bony abnormalities. Electronically Signed   By: Lucienne Capers M.D.   On: 02/05/2021 22:04   CT Maxillofacial Wo Contrast  Result Date: 02/05/2021 CLINICAL DATA:  Fall, facial trauma EXAM: CT MAXILLOFACIAL WITHOUT CONTRAST TECHNIQUE: Multidetector CT imaging of the maxillofacial structures was performed. Multiplanar CT image reconstructions were also generated. COMPARISON:  None. FINDINGS: Osseous: Multiple nasal bone fractures, mildly displaced. Buckling of the nasal septum, likely fracture. No additional facial fracture. Orbits: Negative. No traumatic or inflammatory finding. Sinuses: No air-fluid levels. Soft tissues: Soft tissue swelling over the nose and upper lip region. Limited intracranial: See head CT report IMPRESSION: Multiple displaced nasal bone fractures. Electronically Signed   By: Rolm Baptise M.D.   On: 02/05/2021 22:10    Anti-infectives: Anti-infectives (From admission, onward)   Start     Dose/Rate Route Frequency Ordered Stop   02/05/21 2300  ceFAZolin (ANCEF) IVPB 1 g/50 mL premix        1 g 100 mL/hr over 30 Minutes Intravenous  Once 02/05/21 2257 02/06/21 0249       Assessment/Plan Fall from standing SAH - per Dr. Ronnald Ramp, repeat head CT ordered.  7 days of Keppra for prophylaxis, PT/OT Nasal bone fx - supportive care Left 5th finger abrasion - repaired by ED, may follow up with pcp for further care Home meds resumed FEN - regular diet VTE - on hold due to [facility] ID - none Dispo - imminent DC PT/OT,  repeat head CT, if all stable, can likely DC home later today, if tolerates diet as well.   LOS: 1 day    Henreitta Cea , [facility] 02/06/2021, 10:54 AM Please see Amion for pager number during day hours 7:00am-4:30pm or 7:00am -11:30am on weekends

## 2021-02-06 NOTE — Evaluation (Signed)
Occupational Therapy Evaluation Patient Details Name: Gilbert Rangel MRN: [id number] DOB: September 22, 1946 Today's Date: 02/06/2021    History of Present Illness 75 y.o. male presenting s/p mechanical fall in driveway with injury to face and abrasion at 5th digit of L hand. CT cervical spine (-). CT head (+) SAH in L poserior parietal region. Patient also noted to have multiple displaced nasal bone fx. PMHx significant for thyroid disease, Hx meniscal repair, previous back surgery, rotator cuff injury, and repair of biceps tendon.   Clinical Impression   PTA patient was living in a private residence with his son and was grossly I with ADLs/IADLs without AD. Patient states shared responsibility with cooking/cleaning and reports that his son provides transportation. Patient notes visual deficits at baseline 2/2 macular degeneration and glaucoma. Patient currently functioning near baseline demonstrating observed ADLs including LB dressing, grooming standing at sink and ADL transfers with supervision A to I. Patient limited by soreness "all over" and visual deficits present at baseline. Patient/family education on low vision strategies including removing throw rugs, ensuring adequate lighting in home, and clearing walking areas of cords to prevent falls. Education also provide on supervision A for walk-in shower transfers and sitting to bathe/dress LB. Patient and son expressed verbal understanding. Son works from home and is able to provide supervision/assist as needed. Recommendation for return home.     Follow Up Recommendations  No OT follow up;Supervision/Assistance - 24 hour    Equipment Recommendations  None recommended by OT    Recommendations for Other Services       Precautions / Restrictions Precautions Precautions: Fall Precaution Comments: visual deficits at baseline, L eye swelling, HOH speak into L ear. Restrictions Weight Bearing Restrictions: No      Mobility Bed Mobility Overal  bed mobility: Independent                  Transfers Overall transfer level: Needs assistance Equipment used: None Transfers: Sit to/from Stand Sit to Stand: Supervision         General transfer comment: Supervision A from EOB positioned in lowest setting.    Balance Overall balance assessment: Mild deficits observed, not formally tested                                         ADL either performed or assessed with clinical judgement   ADL Overall ADL's : At baseline                                       General ADL Comments: Able to don shoes seated EOB without external assist and increased time. Son present at bedside notes mobility at baseline.     Vision Baseline Vision/History: Glaucoma;Macular Degeneration Patient Visual Report: No change from baseline (Visual deficits at baseline. Can see shapes but cannot make out details.) Vision Assessment?: Yes Additional Comments: Patient able to see clock on wall but unable to make out numbers. States that he can see his face in the mirror but cannot make out details. This is baseline.     Perception     Praxis      Pertinent Vitals/Pain Pain Assessment: 0-10 Pain Score: 3  Pain Location: "all over" in back, face, and L hand Pain Descriptors / Indicators: Sore;Grimacing;Guarding Pain Intervention(s): Limited activity within patient's tolerance;Monitored  during session;Repositioned     Hand Dominance Right   Extremity/Trunk Assessment Upper Extremity Assessment Upper Extremity Assessment: Overall WFL for tasks assessed   Lower Extremity Assessment Lower Extremity Assessment: Defer to PT evaluation   Cervical / Trunk Assessment Cervical / Trunk Assessment: Normal   Communication Communication 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 Follows multi-step verbal commands without difficulty.   General Comments  Abrasion to L side of face and L hand.    Exercises     Shoulder Instructions      Home Living Family/patient expects to be discharged to:: Private residence Living Arrangements: Children (Son) Available Help at Discharge: Family;Available 24 hours/day Type of Home: House Home Access: Stairs to enter [employer] of Steps: 3 from garage Entrance Stairs-Rails: Right (Ascending) Home Layout: One level     Bathroom Shower/Tub: Walk-in shower;Door   [employer]:  (comfort height)     Home Equipment: Shower seat - built in          Prior Functioning/Environment Level of Independence: Independent        Comments: Independent with ADLs/IADLs without AD.        OT Problem List: Pain;Impaired vision/perception      OT Treatment/Interventions:      OT Goals(Current goals can be found in the care plan section) Acute Rehab OT Goals Patient Stated Goal: To return home. OT Goal Formulation: With patient/family Time For Goal Achievement: 02/20/21 Potential to Achieve Goals: Good  OT Frequency:     Barriers to D/C:            Co-evaluation              AM-PAC OT "6 Clicks" Daily Activity     Outcome Measure Help from another person eating meals?: None Help from another person taking care of personal grooming?: A Little Help from another person toileting, which includes using toliet, bedpan, or urinal?: A Little Help from another person bathing (including washing, rinsing, drying)?: A Little Help from another person to put on and taking off regular upper body clothing?: None Help from another person to put on and taking off regular lower body clothing?: A Little 6 Click Score: 20   End of Session Equipment Utilized During Treatment: Gait belt Nurse Communication: Mobility  status  Activity Tolerance: Patient tolerated treatment well Patient left: in chair;with call bell/phone within reach;with chair alarm set;with family/visitor present  OT Visit Diagnosis: Unsteadiness on feet (R26.81)                Time: 5035-4656 OT Time Calculation (min): 30 min Charges:  OT General Charges $OT Visit: 1 Visit OT Evaluation $OT Eval Low Complexity: 1 Low OT Treatments $Self Care/Home Management : 8-22 mins  Donaldo Teegarden H. OTR/L Supplemental OT, Department of rehab services [phone]  Ane Conerly R H. 02/06/2021, 2:47 PM

## 2021-02-06 NOTE — ED Notes (Signed)
Pt accompanied to the rest room.    Alert and oriented skin cool and dry.  Both pupils 3.0 equal and react to light

## 2021-02-06 NOTE — Care Management Obs Status (Signed)
[place] NOTIFICATION   Patient Details  Name: Gilbert Rangel MRN: [id number] Date of Birth: 1946-06-08   Medicare Observation Status Notification Given:  Yes    Claudie Leach, RN 02/06/2021, 6:44 PM

## 2021-02-06 NOTE — ED Notes (Signed)
Unsuccessful attempt to call report  rn to call me back 

## 2021-02-06 NOTE — Discharge Instructions (Signed)
May resume aspirin when cleared by neurosurgery, Dr. Ronnald Ramp  Nasal Fracture A fracture is a break in a bone. A nasal fracture is a broken nose. Minor breaks do not need treatment. They often heal on their own in about one month. Serious breaks may need treatment. Sometimes surgery is needed. What are the causes? This condition is usually caused by a direct hit to the nose (blunt injury). This often occurs from:  Playing a contact sport.  Being in a car accident.  Falling.  Getting punched. What are the signs or symptoms?  Pain.  Swelling of the nose.  Bleeding from the nose.  Bruising around the nose or bruising around the eyes (black eyes).  The nose having a crooked shape. How is this treated? Treatment depends on how bad the injury is.  Minor breaks often do not need treatment.  For more serious breaks that have caused bones to move out of position, treatment may involve one of these: ? Moving the bones back into position without surgery. Your doctor may be able to do this in his or her office after you are given medicine to numb the nose area (local anesthetic). ? Surgery. If needed, this will be done after the swelling is gone. Follow these instructions at home: Activity  Return to your normal activities as told by your doctor. Ask your doctor what activities are safe for you.  Do not play contact sports for 3-4 weeks or as told by your doctor. General instructions  If told, put ice on the injured area: ? Put ice in a plastic bag. ? Place a towel between your skin and the bag. ? Leave the ice on for 20 minutes, 2-3 times a day.  Take over-the-counter and prescription medicines only as told by your doctor.  If your nose bleeds, sit up while you gently squeeze your nose shut for 10 minutes.  Try to not blow your nose.  Keep all follow-up visits as told by your doctor. This is important.      Contact a doctor if:  You have more pain or very bad pain.  You  keep having nosebleeds.  The shape of your nose does not return to normal after 5 days.  You have pus coming out of your nose. Get help right away if:  Your nose bleeds for more than 20 minutes.  You have clear fluid draining out of your nose.  You have a swelling on the inside of your nose that does not get better.  You have trouble moving your eyes.  You keep throwing up (vomiting). Summary  A nasal fracture is a broken nose.  Symptoms include pain, swelling, and bruising.  Minor breaks often do not require treatment. More serious breaks may require surgery or other treatments.  If your nose bleeds, sit up while you gently squeeze your nose shut for 10 minutes. This information is not intended to replace advice given to you by your health care provider. Make sure you discuss any questions you have with your health care provider. Document Revised: 03/26/2018 Document Reviewed: 03/26/2018 Elsevier Patient Education  2021 Outlook.   Subarachnoid Hemorrhage Subarachnoid hemorrhage is bleeding around the brain. The bleeding puts pressure on the brain, and it stops blood from going to some areas of the brain. If this bleeding is not treated, it may cause brain damage or death. This is an emergency. You must be treated in the hospital right away. What are the causes?  Having a weak  blood vessel in the brain that bursts.  Getting a head injury.  Bleeding from blood vessels that have developed abnormally.  Having a bleeding disorder.  Using blood-thinning medicines (anticoagulants).  Using certain drugs, such as cocaine. In some cases, the cause is not known.   What increases the risk?  Smoking.  Having high blood pressure.  Drinking too much alcohol.  Having a family history of brain aneurysm.  Being older than 75 years old.  Being male, especially if you no longer have periods.  Having a certain syndrome.  Using cocaine. What are the signs or  symptoms?  A sudden, very bad headache. It may feel like the worst headache you have ever had.  Feeling like you may vomit (nausea) or vomiting, especially if you have other signs such as a headache.  Changes in your mental status such as: ? Fainting. ? Sudden confusion. ? Trouble staying awake.  Stiff neck.  Changes in your vision such as: ? Being sensitive to light. ? Trouble seeing out of one eye or both eyes.  Sudden weakness or loss of feeling in your face, arm, or leg, especially on one side of the body.  Sudden trouble with any of these: ? Walking or moving an arm or leg. ? Talking. ? Understanding what people say. ? Swallowing. ? Balance. How is this treated? You need to be treated in the hospital. Treatment may include:  Medicines that: ? Reverse the effects of blood thinners, if you were taking blood thinners. ? Lower your blood pressure. ? Relieve pain. ? Relieve vomiting or the feeling like you may vomit. ? Prevent seizures.  Surgery to stop bleeding, repair the cause of the bleeding, or remove blood that has collected. This may include: ? A procedure that is done from inside the blood vessel, in which the aneurysm is filled with small, platinum coils (endovascular coiling). ? Opening the skull (craniotomy) to reach the aneurysm and put a clip at the base of the aneurysm (surgical clipping).  Surgery to relieve pressure on the brain by placing a tube in the brain to drain blood.  Physical, occupational, or speech-language therapy . Other treatment depends on the cause and the symptoms, how long the symptoms have lasted, and how bad they are. Follow these instructions at home: Medicines  Take over-the-counter and prescription medicines only as told by your doctor.  Ask your doctor if the medicine prescribed to you requires you to avoid driving or using machinery.  Do not take any medicines that contain aspirin or NSAIDs, such as ibuprofen, unless your  doctor says that it is safe to take them. Lifestyle  Rest and limit activity as told by your doctor. Rest helps your brain to heal. Make sure you: ? Get plenty of sleep. ? Avoid activities that cause stress to your body or mind.  Do not use any products that have nicotine or tobacco. These include cigarettes, e-cigarettes, and chewing tobacco. If you need help quitting, ask your doctor. General instructions  Do therapy as recommended. This may include: ? Physical therapy. ? Occupational therapy. ? Speech-language therapy.  Ask your doctor if it is safe for you to eat and drink. You may need tests to make sure that you can swallow safely.  Check your blood pressure as told by your doctor. Write down your blood pressure.  Do not drive until your doctor says that it is safe to drive.  Keep all follow-up visits as told by your doctor. This is important.  Where to find more information  American Stroke Association: www.stroke.org Contact a doctor if:  You have a stiff neck.  You have a cough.  You have a fever. Get help right away if:  You have any signs of a stroke. "BE FAST" is an easy way to remember the main warning signs: ? B - Balance. Signs are dizziness, sudden trouble walking, or loss of balance. ? E - Eyes. Signs are trouble seeing or a sudden change in how you see. ? F - Face. Signs are sudden weakness or loss of feeling of the face, or the face or eyelid drooping on one side. ? A - Arms. Signs are weakness or loss of feeling in an arm. This happens suddenly and usually on one side of the body. ? S - Speech. Signs are sudden trouble speaking, slurred speech, or trouble understanding what people say. ? T - Time. Time to call emergency services. Write down what time symptoms started.  You have other signs of a stroke, such as: ? A sudden, very bad headache with no known cause. ? Feeling like you may vomit. ? Vomiting. ? Seizure. These symptoms may be an emergency. Do  not wait to see if the symptoms will go away. Get medical help right away. Call your local emergency services (911 in the [place] Do not drive yourself to the hospital. Summary  Subarachnoid hemorrhage is bleeding in the brain. It is an emergency. You must be treated in the hospital right away.  Follow instructions from your doctor about eating, resting, and taking medicines.  Do not take any medicines that contain aspirin or NSAIDs unless your doctor says that it is safe to take them. This information is not intended to replace advice given to you by your health care provider. Make sure you discuss any questions you have with your health care provider. Document Revised: 12/09/2019 Document Reviewed: 12/09/2019 Elsevier Patient Education  2021 [employer].

## 2021-02-06 NOTE — Evaluation (Signed)
Physical Therapy Evaluation Patient Details Name: Gilbert Rangel MRN: [id number] DOB: 1945-12-16 Today's Date: 02/06/2021   History of Present Illness  75 y.o. male presenting s/p mechanical fall in driveway with injury to face and abrasion at 5th digit of L hand. CT cervical spine (-). CT head (+) SAH in L poserior parietal region. Patient also noted to have multiple displaced nasal bone fx. PMHx significant for thyroid disease, Hx meniscal repair, previous back surgery, rotator cuff injury, and repair of biceps tendon.  Clinical Impression   Patient evaluated by Physical Therapy with no further acute PT needs identified. All education has been completed and the patient has no further questions. Lives in a private residence with his son and was grossly I with ADLs/IADLs without AD. Patient states shared responsibility with cooking/cleaning and reports that his son provides transportation. Patient notes visual deficits at baseline 2/2 macular degeneration and glaucoma. Patient currently functioning near baseline, able to walk the hallways without difficulty;  See below for any follow-up Physical Therapy or equipment needs. PT is signing off. Thank you for this referral.      Follow Up Recommendations No PT follow up    Equipment Recommendations  None recommended by PT    Recommendations for Other Services       Precautions / Restrictions Precautions Precautions: Fall Precaution Comments: visual deficits at baseline, L eye swelling, HOH speak into L ear. Restrictions Weight Bearing Restrictions: No      Mobility  Bed Mobility Overal bed mobility: Independent                  Transfers Overall transfer level: Needs assistance Equipment used: None Transfers: Sit to/from Stand Sit to Stand: Supervision         General transfer comment: Supervision A from EOB positioned in lowest setting.  Ambulation/Gait Ambulation/Gait assistance: Supervision Gait Distance (Feet):  150 Feet Assistive device: None;IV Pole Gait Pattern/deviations: Step-through pattern     General Gait Details: Started walking with pt pushing IV pole, and noted him tending to push the IV pole too far ahead; Gait pattern normalized once this PT took the IV pole to allow for more normal walking like at home  Stairs            Wheelchair Mobility    Modified Rankin (Stroke Patients Only)       Balance Overall balance assessment: Mild deficits observed, not formally tested                                           Pertinent Vitals/Pain Pain Assessment: 0-10 Pain Score: 3  Pain Location: "all over" in back, face, and L hand Pain Descriptors / Indicators: Sore;Grimacing;Guarding Pain Intervention(s): Monitored during session    Home Living Family/patient expects to be discharged to:: Private residence Living Arrangements: Children (Son) Available Help at Discharge: Family;Available 24 hours/day Type of Home: House Home Access: Stairs to enter Entrance Stairs-Rails: Right Entrance Stairs-Number of Steps: 3 from garage Home Layout: One level Home Equipment: Shower seat - built in      Prior Function Level of Independence: Independent         Comments: Independent with ADLs/IADLs without AD.     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 Follows multi-step verbal  commands without difficulty.      General Comments General comments (skin integrity, edema, etc.): Abrasion to L side of face and L hand. Discussed RICE technique to help with L hand and wrist    Exercises     Assessment/Plan    PT Assessment Patent does not need any further PT services  PT Problem List         PT Treatment Interventions      PT Goals (Current goals can be found in the Care Plan section)  Acute Rehab PT Goals Patient Stated Goal: To return home. PT Goal Formulation: All assessment and education complete, DC therapy    Frequency     Barriers to discharge        Co-evaluation               AM-PAC PT "6 Clicks" Mobility  Outcome Measure Help needed turning from your back to your side while in a flat bed without using bedrails?: None Help needed moving from lying on your back to sitting on the side of a flat bed without using bedrails?: None Help needed moving to and from a bed to a chair (including a wheelchair)?: None Help needed standing up from a chair using your arms (e.g., wheelchair or bedside chair)?: None Help needed to walk in hospital room?: None Help needed climbing 3-5 steps with a railing? : None 6 Click Score: 24    End of Session   Activity Tolerance: Patient tolerated treatment well Patient left: in bed;with call bell/phone within reach;with family/visitor present Nurse Communication: Mobility status PT Visit Diagnosis: Unsteadiness on feet (R26.81);Repeated falls (R29.6)    Time: 4034-7425 PT Time Calculation (min) (ACUTE ONLY): 28 min   Charges:   PT Evaluation $PT Eval Low Complexity: 1 Low PT Treatments $Gait Training: 8-22 mins        Roney Marion, PT  Acute Rehabilitation Services Pager [phone] Office [phone]   [place] 02/06/2021, 5:58 PM

## 2021-02-06 NOTE — Consult Note (Signed)
Reason for Consult: [facility] Referring Physician: edp  GIANPAOLO MINDEL is an 75 y.o. male.   HPI:  75 year old male presented to the ED last night after sustaining a fall in his driveway. States that there are multiple cracks in his driveway and it was dark. He fell straight forward on his face. Denies any headaches NV dizziness or vision changes. He is eager to go home. Denies any blood thinners.   Past Medical History:  Diagnosis Date  . Hypothyroidism   . Thyroid disease     Past Surgical History:  Procedure Laterality Date  . CHOLECYSTECTOMY    . KNEE ARTHROSCOPY W/ MENISCAL REPAIR    . LUMBAR LAMINECTOMY/DECOMPRESSION MICRODISCECTOMY N/A 09/29/2015   Procedure: MICRO LUMBER DECOMPRESSION L5-S1 AND L4-5 ON RIGHT;  Surgeon: Susa Day, MD;  Location: [facility];  Service: Orthopedics;  Laterality: N/A;  . REPAIR / REINSERT BICEPS TENDON AT ELBOW    . ROTATOR CUFF REPAIR    . TONSILLECTOMY      Allergies  Allergen Reactions  . Apraclonidine     Other reaction(s): Other (See Comments) Redness   . Brimonidine     Other reaction(s): Other (See Comments) redness  . Netarsudil     Other reaction(s): Other (See Comments) redness  . Methazolamide Dermatitis    Social History   Tobacco Use  . Smoking status: Never Smoker  . Smokeless tobacco: Current User  Substance Use Topics  . Alcohol use: Yes    Alcohol/week: 0.0 standard drinks    Comment: occasionally    Family History  Problem Relation Age of Onset  . Diabetes Mother   . Cancer Father      Review of Systems  Positive ROS: as above  All other systems have been reviewed and were otherwise negative with the exception of those mentioned in the HPI and as above.  Objective: Vital signs in last 24 hours: Temp:  [97.4 F (36.3 C)-98.5 F (36.9 C)] 98 F (36.7 C) (04/03 0852) Pulse Rate:  [64-76] 68 (04/03 0852) Resp:  [14-22] 18 (04/03 0852) BP: (129-148)/(64-113) 137/77 (04/03 0852) SpO2:  [97 %-99 %] 97 %  (04/03 0852) Weight:  [89.4 kg] 89.4 kg (04/02 2116)  General Appearance: Alert, cooperative, no distress, appears stated age Head: Normocephalic, without obvious abnormality, trauma to left eye and nose Eyes: PERRL, conjunctiva/corneas clear, EOM's intact, fundi benign, both eyes      Lungs:  respirations unlabored Heart: Regular rate and rhythm  NEUROLOGIC:   Mental status: A&O x4, no aphasia, good attention span, Memory and fund of knowledge Motor Exam - grossly normal, normal tone and bulk Sensory Exam - grossly normal Reflexes: symmetric, no pathologic reflexes, No Hoffman's, No clonus Coordination - grossly normal Gait - not tested Balance - not tested Cranial Nerves: I: smell Not tested  II: visual acuity  OS: na    OD: na  II: visual fields Full to confrontation  II: pupils Equal, round, reactive to light  III,VII: ptosis None  III,IV,VI: extraocular muscles  Full ROM  V: mastication Normal  V: facial light touch sensation  Normal  V,VII: corneal reflex  Present  VII: facial muscle function - upper  Normal  VII: facial muscle function - lower Normal  VIII: hearing Not tested  IX: soft palate elevation  Normal  IX,X: gag reflex Present  XI: trapezius strength  5/5  XI: sternocleidomastoid strength 5/5  XI: neck flexion strength  5/5  XII: tongue strength  Normal  Data Review Lab Results  Component Value Date   WBC 9.4 02/06/2021   HGB 14.2 02/06/2021   HCT 41.8 02/06/2021   MCV 98.6 02/06/2021   PLT 193 02/06/2021   Lab Results  Component Value Date   NA 139 02/06/2021   K 3.6 02/06/2021   CL 103 02/06/2021   CO2 26 02/06/2021   BUN 10 02/06/2021   CREATININE 1.08 02/06/2021   GLUCOSE 95 02/06/2021   Lab Results  Component Value Date   INR 1.0 02/06/2021    Radiology: CT Head Wo Contrast  Result Date: 02/05/2021 CLINICAL DATA:  Head trauma, fall EXAM: CT HEAD WITHOUT CONTRAST TECHNIQUE: Contiguous axial images were obtained from the base of the  skull through the vertex without intravenous contrast. COMPARISON:  08/27/2015 FINDINGS: Brain: Hyperdense area noted posteriorly in the high left parietal region concerning for subarachnoid hemorrhage. No mass effect or midline shift. There is atrophy and chronic small vessel disease changes. Vascular: No hyperdense vessel or unexpected calcification. Skull: No acute calvarial abnormality. Sinuses/Orbits: Paranasal sinuses clear. Postoperative changes in the right orbit. Other: Comminuted nasal bone fractures. IMPRESSION: Area of hemorrhage in the high left posterior parietal region, likely subarachnoid. Atrophy, chronic small vessel disease. Multiple nasal bone fractures. These results were called by telephone at the time of interpretation on 02/05/2021 at 10:05 pm to provider [facility] , who verbally acknowledged these results. Electronically Signed   By: Rolm Baptise M.D.   On: 02/05/2021 22:06   CT Cervical Spine Wo Contrast  Result Date: 02/05/2021 CLINICAL DATA:  Fall EXAM: CT CERVICAL SPINE WITHOUT CONTRAST TECHNIQUE: Multidetector CT imaging of the cervical spine was performed without intravenous contrast. Multiplanar CT image reconstructions were also generated. COMPARISON:  None. FINDINGS: Alignment: Normal Skull base and vertebrae: No acute fracture. No primary bone lesion or focal pathologic process. Soft tissues and spinal canal: No prevertebral fluid or swelling. No visible canal hematoma. Disc levels: Disc spaces maintained. Diffuse degenerative facet disease bilaterally. Upper chest: No acute findings Other: None IMPRESSION: Spondylosis.  No acute bony abnormality. Electronically Signed   By: Rolm Baptise M.D.   On: 02/05/2021 22:12   DG Hand Complete Left  Result Date: 02/05/2021 CLINICAL DATA:  Left hand pain after a fall.  Trip and fall injury. EXAM: LEFT HAND - COMPLETE 3+ VIEW COMPARISON:  None. FINDINGS: Mild degenerative changes in the interphalangeal joints, first carpometacarpal  joint, STT joints, and radiocarpal joint. No evidence of acute fracture or dislocation. No focal bone lesion or bone destruction. Vascular calcifications. IMPRESSION: Mild degenerative changes in the left hand and wrist. No acute bony abnormalities. Electronically Signed   By: Lucienne Capers M.D.   On: 02/05/2021 22:04   CT Maxillofacial Wo Contrast  Result Date: 02/05/2021 CLINICAL DATA:  Fall, facial trauma EXAM: CT MAXILLOFACIAL WITHOUT CONTRAST TECHNIQUE: Multidetector CT imaging of the maxillofacial structures was performed. Multiplanar CT image reconstructions were also generated. COMPARISON:  None. FINDINGS: Osseous: Multiple nasal bone fractures, mildly displaced. Buckling of the nasal septum, likely fracture. No additional facial fracture. Orbits: Negative. No traumatic or inflammatory finding. Sinuses: No air-fluid levels. Soft tissues: Soft tissue swelling over the nose and upper lip region. Limited intracranial: See head CT report IMPRESSION: Multiple displaced nasal bone fractures. Electronically Signed   By: Rolm Baptise M.D.   On: 02/05/2021 22:10     Assessment/Plan: 75 year old male presented to the ED last night after sustaining a fall in his drive way. He fractured his nose and  CT head showed a tSAH in the left posterior parietal, no midline shift or mass effect. We will get a repeat head CT today. If this is stable it is ok to dc him today from a neurosurgical standpoint. Will follow up with him in the office in a couple weeks.   Ocie Cornfield [facility] 02/06/2021 9:55 AM

## 2021-02-06 NOTE — Care Management CC44 (Signed)
Condition Code 44 Documentation Completed  Patient Details  Name: BELINDA BRINGHURST MRN: [id number] Date of Birth: 07-19-1946   Condition Code 44 given:  Yes Patient signature on Condition Code 44 notice:  Yes Documentation of 2 MD's agreement:  Yes Code 44 added to claim:  Yes    Claudie Leach, RN 02/06/2021, 6:44 PM

## 2021-02-14 DIAGNOSIS — H353210 Exudative age-related macular degeneration, right eye, stage unspecified: Secondary | ICD-10-CM | POA: Diagnosis not present

## 2021-02-14 DIAGNOSIS — I609 Nontraumatic subarachnoid hemorrhage, unspecified: Secondary | ICD-10-CM | POA: Diagnosis not present

## 2021-02-14 DIAGNOSIS — I10 Essential (primary) hypertension: Secondary | ICD-10-CM | POA: Diagnosis not present

## 2021-02-14 DIAGNOSIS — W19XXXA Unspecified fall, initial encounter: Secondary | ICD-10-CM | POA: Diagnosis not present

## 2021-02-14 DIAGNOSIS — D692 Other nonthrombocytopenic purpura: Secondary | ICD-10-CM | POA: Diagnosis not present

## 2021-02-14 DIAGNOSIS — Z09 Encounter for follow-up examination after completed treatment for conditions other than malignant neoplasm: Secondary | ICD-10-CM | POA: Diagnosis not present

## 2021-02-16 NOTE — Discharge Summary (Signed)
Physician Discharge Summary  Gilbert Rangel GYK:[id number] DOB: Feb 25, 1946 DOA: 02/05/2021  PCP: Glenda Chroman, MD  Admit date: 02/05/2021 Discharge date: 02/16/2021  Recommendations for Outpatient Follow-up:  1.  (include homehealth, outpatient follow-up instructions, specific recommendations for PCP to follow-up on, etc.)   Follow-up Information    Eustace Moore, MD. Schedule an appointment as soon as possible for a visit in 2 week(s).   Specialty: Neurosurgery Contact information: [address] [place] [address] [phone]        Glenda Chroman, MD Follow up in 1 week(s).   Specialty: Internal Medicine Why: follow up for suture removal of finger and for nasal fracture (as needed) Contact information: [place] [address] [phone]              Discharge Diagnoses:  Active Problems:   Intracranial hemorrhage [facility])   Surgical Procedure: none  Discharge Condition: Good Disposition: Home  Diet recommendation: reg diet   Hospital Course:  75 yo male admitted for fall from standing with SAH and nasal fx. He did well and was able to work with PT and OT. He had a stable head CT HD 1 and was discharged home later on HD 1.  Discharge Instructions  Discharge Instructions    Call MD for:  persistant nausea and vomiting   Complete by: As directed    Call MD for:  severe uncontrolled pain   Complete by: As directed    Call MD for:  temperature >100.4   Complete by: As directed    Diet - low sodium heart healthy   Complete by: As directed    Increase activity slowly   Complete by: As directed      Allergies as of 02/06/2021      Reactions   Apraclonidine    Other reaction(s): Other (See Comments) Redness    Brimonidine    Other reaction(s): Other (See Comments) redness   Netarsudil    Other reaction(s): Other (See Comments) redness   Methazolamide Dermatitis      Medication List    STOP taking these medications    aspirin 325 MG EC tablet   gabapentin 300 MG capsule Commonly known as: NEURONTIN   nicotine 21 mg/24hr patch Commonly known as: NICODERM CQ - dosed in mg/24 hours     TAKE these medications   acetaminophen 325 MG tablet Commonly known as: TYLENOL Take 2 tablets (650 mg total) by mouth every 4 (four) hours as needed for mild pain.   ALPRAZolam 0.5 MG tablet Commonly known as: XANAX Take 0.5 mg by mouth 2 (two) times daily as needed for anxiety.   bacitracin ointment Apply topically 2 (two) times daily.   carboxymethylcellulose 0.5 % Soln Commonly known as: REFRESH PLUS Place 1 drop into both eyes 3 (three) times daily as needed (dry eyes).   docusate sodium 100 MG capsule Commonly known as: Colace Take 1 capsule (100 mg total) by mouth 2 (two) times daily as needed for mild constipation.   Dorzolamide HCl-Timolol Mal PF 2-0.5 % Soln Apply 1 drop to eye 2 (two) times daily.   FLUoxetine 40 MG capsule Commonly known as: PROZAC Take 40 mg by mouth daily.   latanoprost 0.005 % ophthalmic solution Commonly known as: XALATAN Place 1 drop into both eyes at bedtime.   levETIRAcetam 500 MG tablet Commonly known as: KEPPRA Take 1 tablet (500 mg total) by mouth 2 (two) times daily for 7 days.  levothyroxine 88 MCG tablet Commonly known as: SYNTHROID Take 88 mcg by mouth daily.   oxyCODONE 5 MG immediate release tablet Commonly known as: Roxicodone Take 1 tablet (5 mg total) by mouth every 6 (six) hours as needed for moderate pain.   testosterone cypionate 200 MG/ML injection Commonly known as: DEPOTESTOSTERONE CYPIONATE Inject 200 mg into the muscle every 30 (thirty) days.   valsartan 40 MG tablet Commonly known as: DIOVAN Take 20 mg by mouth daily.       Follow-up Information    Eustace Moore, MD. Schedule an appointment as soon as possible for a visit in 2 week(s).   Specialty: Neurosurgery Contact information: [address] [place] [address] [phone]        Glenda Chroman, MD Follow up in 1 week(s).   Specialty: Internal Medicine Why: follow up for suture removal of finger and for nasal fracture (as needed) Contact information: [place] [phone]                The results of significant diagnostics from this hospitalization (including imaging, microbiology, ancillary and laboratory) are listed below for reference.    Significant Diagnostic Studies: CT HEAD WO CONTRAST  Result Date: 02/06/2021 CLINICAL DATA:  Mechanical fall in driveway. Injury to face. Cerebral hemorrhage suspected. EXAM: CT HEAD WITHOUT CONTRAST TECHNIQUE: Contiguous axial images were obtained from the base of the skull through the vertex without intravenous contrast. COMPARISON:  CT head without contrast 02/05/2021 FINDINGS: Brain: Focal subarachnoid hemorrhage in the left central sulcus is stable. No new hemorrhage is present. Moderate generalized atrophy and white matter disease is stable. Ventricles are proportionate to the degree of atrophy. No acute cortical infarct is present. Brainstem and cerebellum are within normal limits. Vascular: Vascular calcifications present within the cavernous internal carotid arteries bilaterally. Calcifications are present at the dural margin of both vertebral arteries. No hyperdense vessel is present. Skull: Calvarium is intact. No focal lytic or blastic lesions are present. No significant extracranial soft tissue lesion is present. Sinuses/Orbits: The paranasal sinuses and mastoid air cells are clear. Scleral banding noted on the right. Bilateral lens replacements are present. Globes and orbits are within normal limits otherwise. IMPRESSION: 1. Stable focal subarachnoid hemorrhage in the left central sulcus. 2. Stable atrophy and white matter disease. 3. No acute intracranial abnormality or significant interval change. Electronically Signed   By: San Morelle M.D.   On:  02/06/2021 15:58   CT Head Wo Contrast  Result Date: 02/05/2021 CLINICAL DATA:  Head trauma, fall EXAM: CT HEAD WITHOUT CONTRAST TECHNIQUE: Contiguous axial images were obtained from the base of the skull through the vertex without intravenous contrast. COMPARISON:  08/27/2015 FINDINGS: Brain: Hyperdense area noted posteriorly in the high left parietal region concerning for subarachnoid hemorrhage. No mass effect or midline shift. There is atrophy and chronic small vessel disease changes. Vascular: No hyperdense vessel or unexpected calcification. Skull: No acute calvarial abnormality. Sinuses/Orbits: Paranasal sinuses clear. Postoperative changes in the right orbit. Other: Comminuted nasal bone fractures. IMPRESSION: Area of hemorrhage in the high left posterior parietal region, likely subarachnoid. Atrophy, chronic small vessel disease. Multiple nasal bone fractures. These results were called by telephone at the time of interpretation on 02/05/2021 at 10:05 pm to provider [facility] , who verbally acknowledged these results. Electronically Signed   By: Rolm Baptise M.D.   On: 02/05/2021 22:06   CT Cervical Spine Wo Contrast  Result Date: 02/05/2021  CLINICAL DATA:  Fall EXAM: CT CERVICAL SPINE WITHOUT CONTRAST TECHNIQUE: Multidetector CT imaging of the cervical spine was performed without intravenous contrast. Multiplanar CT image reconstructions were also generated. COMPARISON:  None. FINDINGS: Alignment: Normal Skull base and vertebrae: No acute fracture. No primary bone lesion or focal pathologic process. Soft tissues and spinal canal: No prevertebral fluid or swelling. No visible canal hematoma. Disc levels: Disc spaces maintained. Diffuse degenerative facet disease bilaterally. Upper chest: No acute findings Other: None IMPRESSION: Spondylosis.  No acute bony abnormality. Electronically Signed   By: Rolm Baptise M.D.   On: 02/05/2021 22:12   DG Hand Complete Left  Result Date: 02/05/2021 CLINICAL DATA:   Left hand pain after a fall.  Trip and fall injury. EXAM: LEFT HAND - COMPLETE 3+ VIEW COMPARISON:  None. FINDINGS: Mild degenerative changes in the interphalangeal joints, first carpometacarpal joint, STT joints, and radiocarpal joint. No evidence of acute fracture or dislocation. No focal bone lesion or bone destruction. Vascular calcifications. IMPRESSION: Mild degenerative changes in the left hand and wrist. No acute bony abnormalities. Electronically Signed   By: Lucienne Capers M.D.   On: 02/05/2021 22:04   CT Maxillofacial Wo Contrast  Result Date: 02/05/2021 CLINICAL DATA:  Fall, facial trauma EXAM: CT MAXILLOFACIAL WITHOUT CONTRAST TECHNIQUE: Multidetector CT imaging of the maxillofacial structures was performed. Multiplanar CT image reconstructions were also generated. COMPARISON:  None. FINDINGS: Osseous: Multiple nasal bone fractures, mildly displaced. Buckling of the nasal septum, likely fracture. No additional facial fracture. Orbits: Negative. No traumatic or inflammatory finding. Sinuses: No air-fluid levels. Soft tissues: Soft tissue swelling over the nose and upper lip region. Limited intracranial: See head CT report IMPRESSION: Multiple displaced nasal bone fractures. Electronically Signed   By: Rolm Baptise M.D.   On: 02/05/2021 22:10    Labs: Basic Metabolic Panel: No results for input(s): NA, K, CL, CO2, GLUCOSE, BUN, CREATININE, CALCIUM, MG, PHOS in the last 168 hours. Liver Function Tests: No results for input(s): AST, ALT, ALKPHOS, BILITOT, PROT, ALBUMIN in the last 168 hours.  CBC: No results for input(s): WBC, NEUTROABS, HGB, HCT, MCV, PLT in the last 168 hours.  CBG: No results for input(s): GLUCAP in the last 168 hours.  Active Problems:   Intracranial hemorrhage ([place])   Time coordinating discharge: 15 min

## 2021-02-21 DIAGNOSIS — S022XXA Fracture of nasal bones, initial encounter for closed fracture: Secondary | ICD-10-CM | POA: Diagnosis not present

## 2021-03-04 DIAGNOSIS — I10 Essential (primary) hypertension: Secondary | ICD-10-CM | POA: Diagnosis not present

## 2021-03-05 DIAGNOSIS — E039 Hypothyroidism, unspecified: Secondary | ICD-10-CM | POA: Diagnosis not present

## 2021-03-05 DIAGNOSIS — F329 Major depressive disorder, single episode, unspecified: Secondary | ICD-10-CM | POA: Diagnosis not present

## 2021-03-07 DIAGNOSIS — E349 Endocrine disorder, unspecified: Secondary | ICD-10-CM | POA: Diagnosis not present

## 2021-03-08 DIAGNOSIS — I609 Nontraumatic subarachnoid hemorrhage, unspecified: Secondary | ICD-10-CM | POA: Diagnosis not present

## 2021-03-09 ENCOUNTER — Other Ambulatory Visit: Payer: Self-pay | Admitting: Student

## 2021-03-09 DIAGNOSIS — I609 Nontraumatic subarachnoid hemorrhage, unspecified: Secondary | ICD-10-CM

## 2021-03-10 DIAGNOSIS — H34812 Central retinal vein occlusion, left eye, with macular edema: Secondary | ICD-10-CM | POA: Diagnosis not present

## 2021-03-10 DIAGNOSIS — H3582 Retinal ischemia: Secondary | ICD-10-CM | POA: Diagnosis not present

## 2021-03-10 DIAGNOSIS — H35353 Cystoid macular degeneration, bilateral: Secondary | ICD-10-CM | POA: Diagnosis not present

## 2021-03-10 DIAGNOSIS — H401113 Primary open-angle glaucoma, right eye, severe stage: Secondary | ICD-10-CM | POA: Diagnosis not present

## 2021-03-10 DIAGNOSIS — H401121 Primary open-angle glaucoma, left eye, mild stage: Secondary | ICD-10-CM | POA: Diagnosis not present

## 2021-03-10 DIAGNOSIS — H35351 Cystoid macular degeneration, right eye: Secondary | ICD-10-CM | POA: Diagnosis not present

## 2021-03-10 DIAGNOSIS — H353231 Exudative age-related macular degeneration, bilateral, with active choroidal neovascularization: Secondary | ICD-10-CM | POA: Diagnosis not present

## 2021-03-10 DIAGNOSIS — Z9849 Cataract extraction status, unspecified eye: Secondary | ICD-10-CM | POA: Diagnosis not present

## 2021-03-10 DIAGNOSIS — Z961 Presence of intraocular lens: Secondary | ICD-10-CM | POA: Diagnosis not present

## 2021-03-10 DIAGNOSIS — H04123 Dry eye syndrome of bilateral lacrimal glands: Secondary | ICD-10-CM | POA: Diagnosis not present

## 2021-03-10 DIAGNOSIS — H35373 Puckering of macula, bilateral: Secondary | ICD-10-CM | POA: Diagnosis not present

## 2021-03-10 DIAGNOSIS — H3562 Retinal hemorrhage, left eye: Secondary | ICD-10-CM | POA: Diagnosis not present

## 2021-03-16 DIAGNOSIS — E349 Endocrine disorder, unspecified: Secondary | ICD-10-CM | POA: Diagnosis not present

## 2021-03-19 ENCOUNTER — Ambulatory Visit
Admission: RE | Admit: 2021-03-19 | Discharge: 2021-03-19 | Disposition: A | Discharge status: Home / Self Care | Payer: Medicare Other | Source: Ambulatory Visit | Attending: Student | Admitting: Student

## 2021-03-19 ENCOUNTER — Other Ambulatory Visit: Payer: Self-pay

## 2021-03-19 DIAGNOSIS — S0990XA Unspecified injury of head, initial encounter: Secondary | ICD-10-CM | POA: Diagnosis not present

## 2021-03-19 DIAGNOSIS — I609 Nontraumatic subarachnoid hemorrhage, unspecified: Secondary | ICD-10-CM

## 2021-03-19 IMAGING — CT CT HEAD W/O CM
1 series · 16 of 30 positions shown, 20 images · non-contrast
Comparison: CT head [DATE]

CLINICAL DATA: Follow-up traumatic subarachnoid hemorrhage

EXAM:
CT HEAD WITHOUT CONTRAST
TECHNIQUE: Contiguous axial images were obtained from the base of the skull
through the vertex without intravenous contrast.

[Series 2: head w/(date) · axial · 0.48mm/px · z∈[-208,-42]mm · 16 of 37 slices shown, 20 images]
[im 2/37  brain]
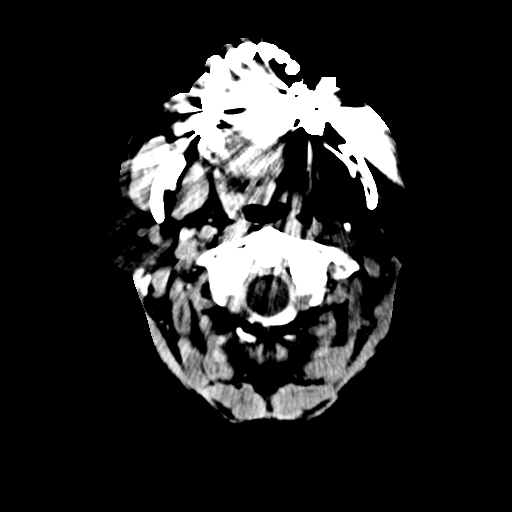
[im 2/37  bone]
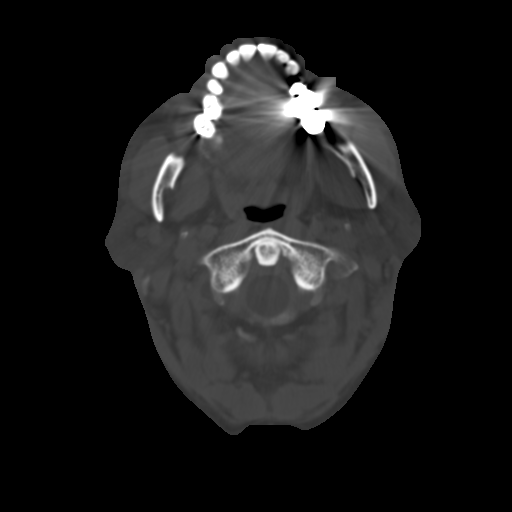
[im 4/37  brain]
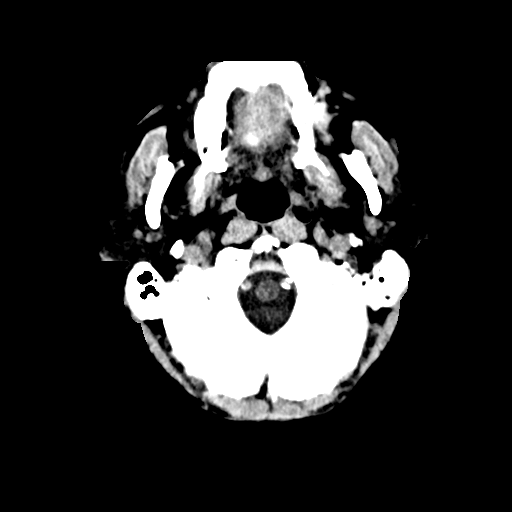
[im 7/37  brain]
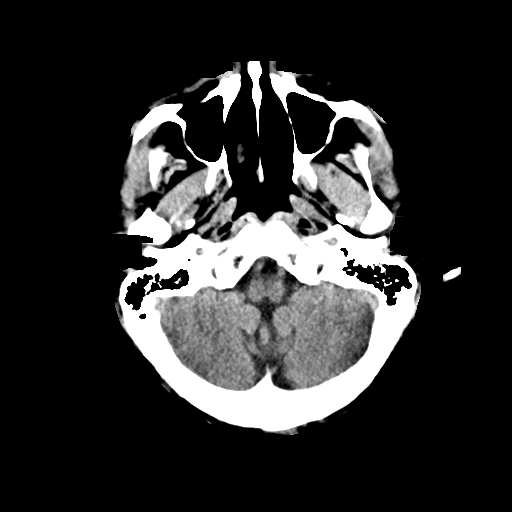
[im 9/37  brain]
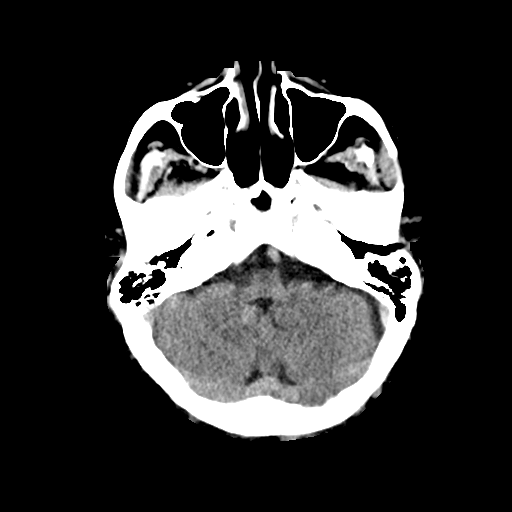
[im 10/37  brain]
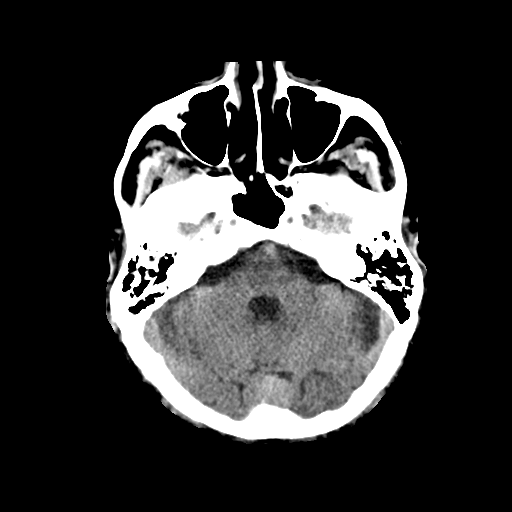
[im 10/37  bone]
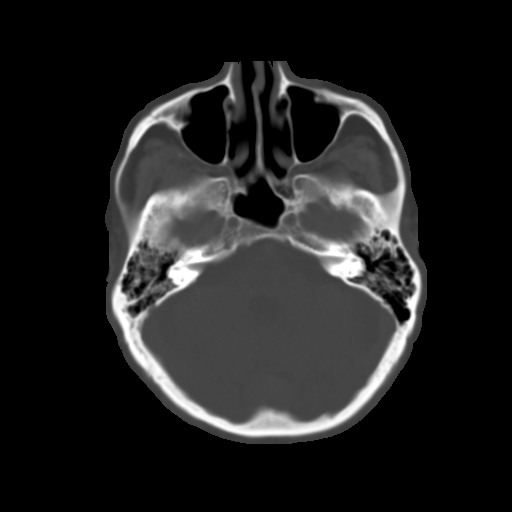
[im 13/37  brain]
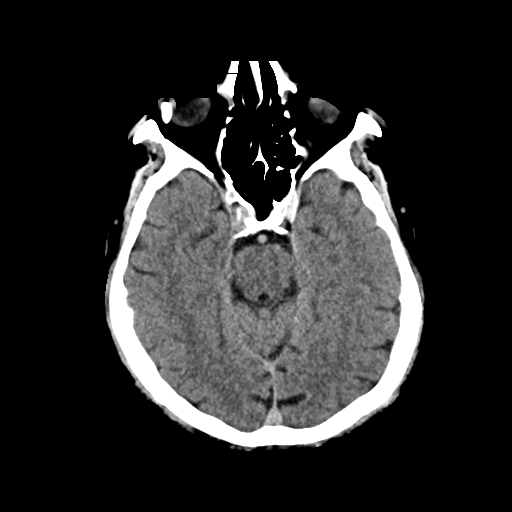
[im 15/37  brain]
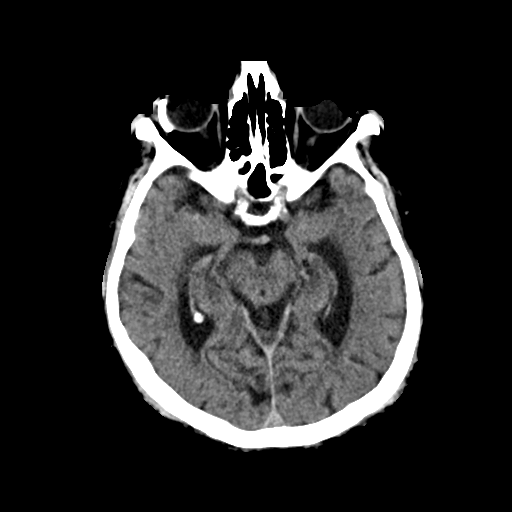
[im 18/37  brain]
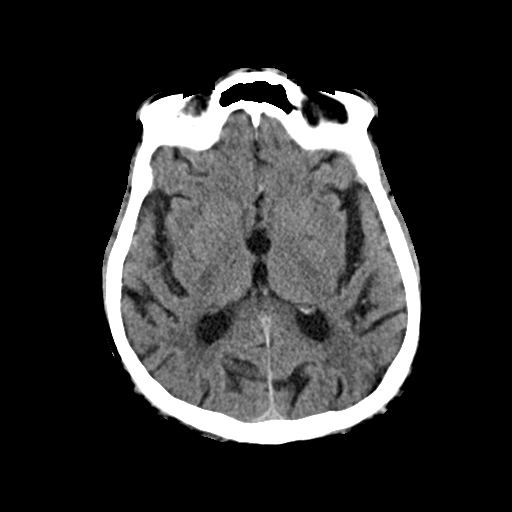
[im 19/37  brain]
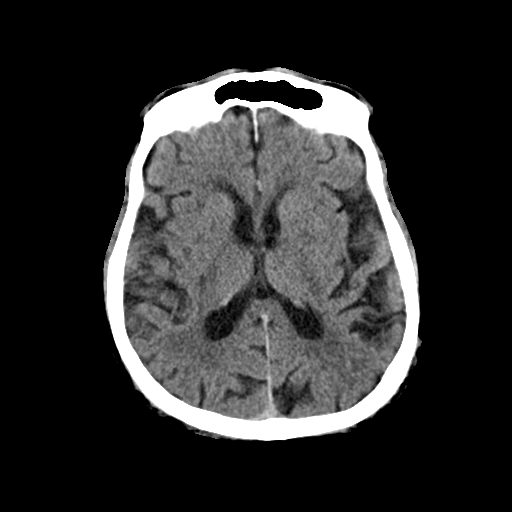
[im 19/37  bone]
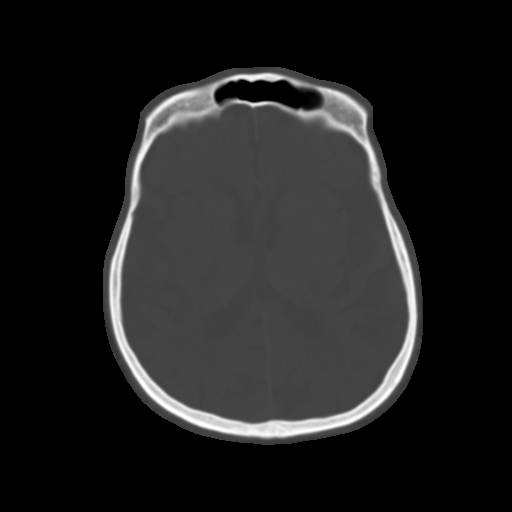
[im 22/37  brain]
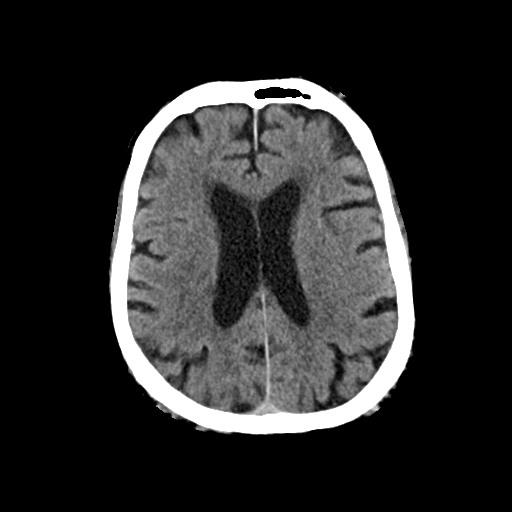
[im 24/37  brain]
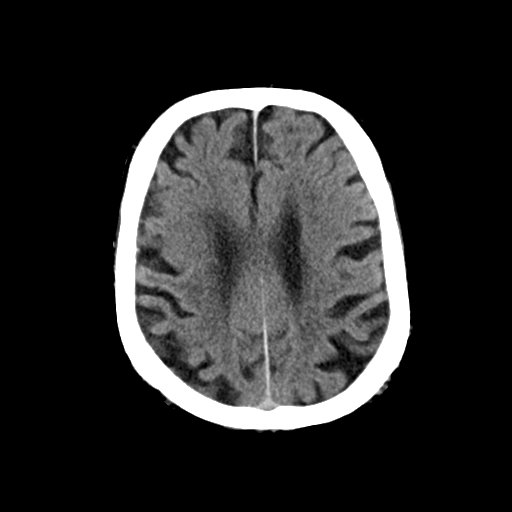
[im 27/37  brain]
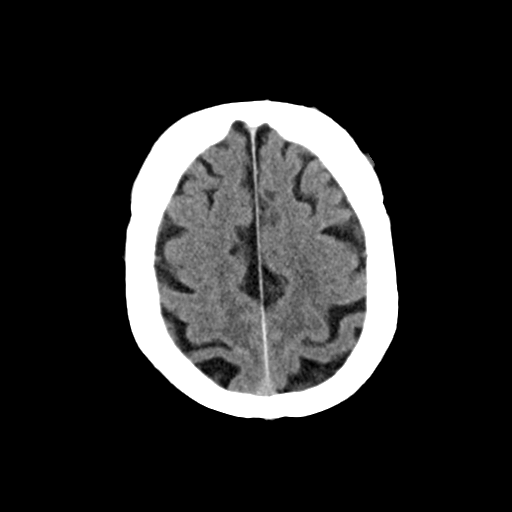
[im 28/37  brain]
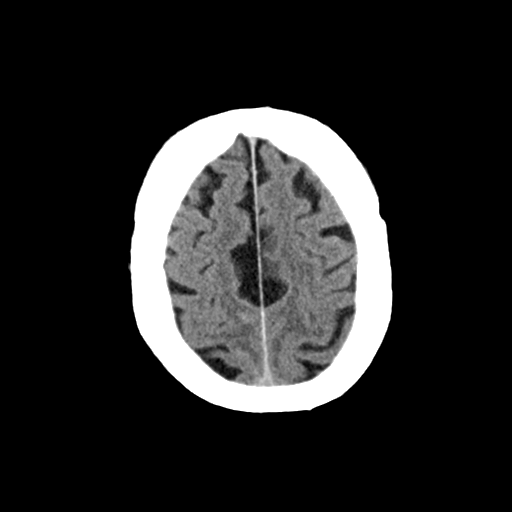
[im 28/37  bone]
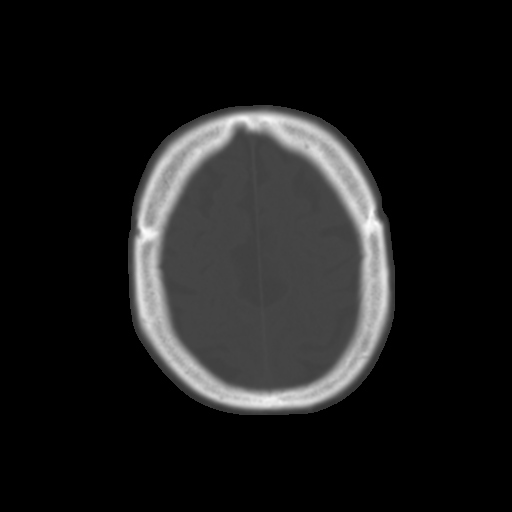
[im 30/37  brain]
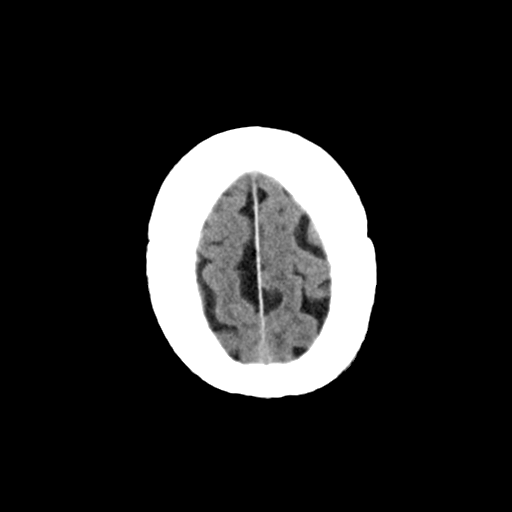
[im 33/37  brain]
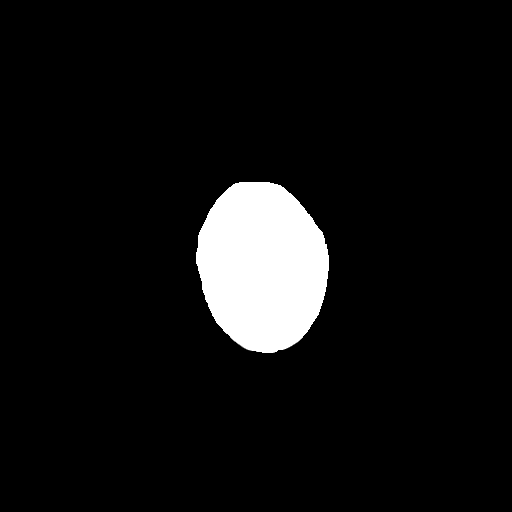
[im 35/37  brain]
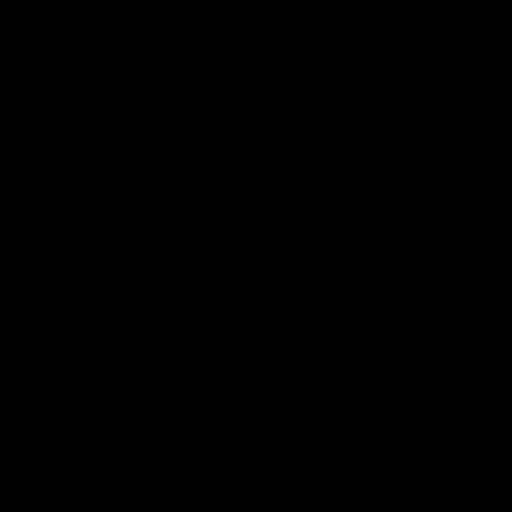

[16 of 30 positions shown; findings below may reference images not displayed]

FINDINGS: Brain: Interval resolution of left convexity small volume
subarachnoid hemorrhage. No hemorrhage or fluid collection
identified. No hydrocephalus

Generalized atrophy. Mild patchy white matter hypodensity
bilaterally. No acute infarct or mass.

Vascular: Negative for hyperdense vessel

Skull: Negative

Sinuses/Orbits: Comminuted nasal bone fracture with displacement
which was seen previously.

Mild mucosal edema left maxillary sinus. Remaining sinuses clear.
Bilateral cataract extraction. Ocular valve on the right.

Other: None
IMPRESSION: Interval resolution of left convexity small volume subarachnoid
hemorrhage. No hemorrhage or hydrocephalus identified.

## 2021-03-24 DIAGNOSIS — I609 Nontraumatic subarachnoid hemorrhage, unspecified: Secondary | ICD-10-CM | POA: Diagnosis not present

## 2021-04-05 DIAGNOSIS — I10 Essential (primary) hypertension: Secondary | ICD-10-CM | POA: Diagnosis not present

## 2021-04-14 DIAGNOSIS — H3581 Retinal edema: Secondary | ICD-10-CM | POA: Diagnosis not present

## 2021-04-14 DIAGNOSIS — H353231 Exudative age-related macular degeneration, bilateral, with active choroidal neovascularization: Secondary | ICD-10-CM | POA: Diagnosis not present

## 2021-04-14 DIAGNOSIS — H04123 Dry eye syndrome of bilateral lacrimal glands: Secondary | ICD-10-CM | POA: Diagnosis not present

## 2021-04-14 DIAGNOSIS — H3582 Retinal ischemia: Secondary | ICD-10-CM | POA: Diagnosis not present

## 2021-04-14 DIAGNOSIS — H34812 Central retinal vein occlusion, left eye, with macular edema: Secondary | ICD-10-CM | POA: Diagnosis not present

## 2021-04-15 DIAGNOSIS — E349 Endocrine disorder, unspecified: Secondary | ICD-10-CM | POA: Diagnosis not present

## 2021-05-02 DIAGNOSIS — Z299 Encounter for prophylactic measures, unspecified: Secondary | ICD-10-CM | POA: Diagnosis not present

## 2021-05-02 DIAGNOSIS — I10 Essential (primary) hypertension: Secondary | ICD-10-CM | POA: Diagnosis not present

## 2021-05-02 DIAGNOSIS — M25552 Pain in left hip: Secondary | ICD-10-CM | POA: Diagnosis not present

## 2021-05-02 DIAGNOSIS — F419 Anxiety disorder, unspecified: Secondary | ICD-10-CM | POA: Diagnosis not present

## 2021-05-02 DIAGNOSIS — E039 Hypothyroidism, unspecified: Secondary | ICD-10-CM | POA: Diagnosis not present

## 2021-05-05 DIAGNOSIS — H401121 Primary open-angle glaucoma, left eye, mild stage: Secondary | ICD-10-CM | POA: Diagnosis not present

## 2021-05-05 DIAGNOSIS — I10 Essential (primary) hypertension: Secondary | ICD-10-CM | POA: Diagnosis not present

## 2021-05-05 DIAGNOSIS — F329 Major depressive disorder, single episode, unspecified: Secondary | ICD-10-CM | POA: Diagnosis not present

## 2021-05-05 DIAGNOSIS — E039 Hypothyroidism, unspecified: Secondary | ICD-10-CM | POA: Diagnosis not present

## 2021-05-05 DIAGNOSIS — H401113 Primary open-angle glaucoma, right eye, severe stage: Secondary | ICD-10-CM | POA: Diagnosis not present

## 2021-05-16 DIAGNOSIS — E349 Endocrine disorder, unspecified: Secondary | ICD-10-CM | POA: Diagnosis not present

## 2021-05-16 DIAGNOSIS — Z299 Encounter for prophylactic measures, unspecified: Secondary | ICD-10-CM | POA: Diagnosis not present

## 2021-05-19 DIAGNOSIS — H35351 Cystoid macular degeneration, right eye: Secondary | ICD-10-CM | POA: Diagnosis not present

## 2021-05-19 DIAGNOSIS — H35373 Puckering of macula, bilateral: Secondary | ICD-10-CM | POA: Diagnosis not present

## 2021-05-19 DIAGNOSIS — H3562 Retinal hemorrhage, left eye: Secondary | ICD-10-CM | POA: Diagnosis not present

## 2021-05-19 DIAGNOSIS — H34812 Central retinal vein occlusion, left eye, with macular edema: Secondary | ICD-10-CM | POA: Diagnosis not present

## 2021-05-19 DIAGNOSIS — H04123 Dry eye syndrome of bilateral lacrimal glands: Secondary | ICD-10-CM | POA: Diagnosis not present

## 2021-05-19 DIAGNOSIS — H3582 Retinal ischemia: Secondary | ICD-10-CM | POA: Diagnosis not present

## 2021-05-19 DIAGNOSIS — H353231 Exudative age-related macular degeneration, bilateral, with active choroidal neovascularization: Secondary | ICD-10-CM | POA: Diagnosis not present

## 2021-06-03 DIAGNOSIS — I10 Essential (primary) hypertension: Secondary | ICD-10-CM | POA: Diagnosis not present

## 2021-06-05 DIAGNOSIS — F329 Major depressive disorder, single episode, unspecified: Secondary | ICD-10-CM | POA: Diagnosis not present

## 2021-06-05 DIAGNOSIS — E039 Hypothyroidism, unspecified: Secondary | ICD-10-CM | POA: Diagnosis not present

## 2021-06-09 DIAGNOSIS — Z789 Other specified health status: Secondary | ICD-10-CM | POA: Diagnosis not present

## 2021-06-09 DIAGNOSIS — Z299 Encounter for prophylactic measures, unspecified: Secondary | ICD-10-CM | POA: Diagnosis not present

## 2021-06-09 DIAGNOSIS — J069 Acute upper respiratory infection, unspecified: Secondary | ICD-10-CM | POA: Diagnosis not present

## 2021-06-13 DIAGNOSIS — H401121 Primary open-angle glaucoma, left eye, mild stage: Secondary | ICD-10-CM | POA: Diagnosis not present

## 2021-06-13 DIAGNOSIS — H401113 Primary open-angle glaucoma, right eye, severe stage: Secondary | ICD-10-CM | POA: Diagnosis not present

## 2021-06-16 DIAGNOSIS — E349 Endocrine disorder, unspecified: Secondary | ICD-10-CM | POA: Diagnosis not present

## 2021-06-21 DIAGNOSIS — H53412 Scotoma involving central area, left eye: Secondary | ICD-10-CM | POA: Diagnosis not present

## 2021-06-23 DIAGNOSIS — H04123 Dry eye syndrome of bilateral lacrimal glands: Secondary | ICD-10-CM | POA: Diagnosis not present

## 2021-06-23 DIAGNOSIS — H3581 Retinal edema: Secondary | ICD-10-CM | POA: Diagnosis not present

## 2021-06-23 DIAGNOSIS — H3582 Retinal ischemia: Secondary | ICD-10-CM | POA: Diagnosis not present

## 2021-06-23 DIAGNOSIS — H3562 Retinal hemorrhage, left eye: Secondary | ICD-10-CM | POA: Diagnosis not present

## 2021-06-23 DIAGNOSIS — H53412 Scotoma involving central area, left eye: Secondary | ICD-10-CM | POA: Diagnosis not present

## 2021-06-23 DIAGNOSIS — H35373 Puckering of macula, bilateral: Secondary | ICD-10-CM | POA: Diagnosis not present

## 2021-06-23 DIAGNOSIS — H35353 Cystoid macular degeneration, bilateral: Secondary | ICD-10-CM | POA: Diagnosis not present

## 2021-06-23 DIAGNOSIS — H34812 Central retinal vein occlusion, left eye, with macular edema: Secondary | ICD-10-CM | POA: Diagnosis not present

## 2021-06-23 DIAGNOSIS — Z961 Presence of intraocular lens: Secondary | ICD-10-CM | POA: Diagnosis not present

## 2021-06-23 DIAGNOSIS — H353231 Exudative age-related macular degeneration, bilateral, with active choroidal neovascularization: Secondary | ICD-10-CM | POA: Diagnosis not present

## 2021-06-30 DIAGNOSIS — I10 Essential (primary) hypertension: Secondary | ICD-10-CM | POA: Diagnosis not present

## 2021-06-30 DIAGNOSIS — J069 Acute upper respiratory infection, unspecified: Secondary | ICD-10-CM | POA: Diagnosis not present

## 2021-06-30 DIAGNOSIS — R21 Rash and other nonspecific skin eruption: Secondary | ICD-10-CM | POA: Diagnosis not present

## 2021-06-30 DIAGNOSIS — Z299 Encounter for prophylactic measures, unspecified: Secondary | ICD-10-CM | POA: Diagnosis not present

## 2021-07-06 DIAGNOSIS — I10 Essential (primary) hypertension: Secondary | ICD-10-CM | POA: Diagnosis not present

## 2021-07-19 DIAGNOSIS — E349 Endocrine disorder, unspecified: Secondary | ICD-10-CM | POA: Diagnosis not present

## 2021-08-01 DIAGNOSIS — R059 Cough, unspecified: Secondary | ICD-10-CM | POA: Diagnosis not present

## 2021-08-04 DIAGNOSIS — H35373 Puckering of macula, bilateral: Secondary | ICD-10-CM | POA: Diagnosis not present

## 2021-08-04 DIAGNOSIS — H353231 Exudative age-related macular degeneration, bilateral, with active choroidal neovascularization: Secondary | ICD-10-CM | POA: Diagnosis not present

## 2021-08-04 DIAGNOSIS — H04123 Dry eye syndrome of bilateral lacrimal glands: Secondary | ICD-10-CM | POA: Diagnosis not present

## 2021-08-04 DIAGNOSIS — H34812 Central retinal vein occlusion, left eye, with macular edema: Secondary | ICD-10-CM | POA: Diagnosis not present

## 2021-08-04 DIAGNOSIS — H3582 Retinal ischemia: Secondary | ICD-10-CM | POA: Diagnosis not present

## 2021-08-04 DIAGNOSIS — H3562 Retinal hemorrhage, left eye: Secondary | ICD-10-CM | POA: Diagnosis not present

## 2021-08-05 DIAGNOSIS — I10 Essential (primary) hypertension: Secondary | ICD-10-CM | POA: Diagnosis not present

## 2021-08-05 DIAGNOSIS — E7800 Pure hypercholesterolemia, unspecified: Secondary | ICD-10-CM | POA: Diagnosis not present

## 2021-08-17 DIAGNOSIS — Z299 Encounter for prophylactic measures, unspecified: Secondary | ICD-10-CM | POA: Diagnosis not present

## 2021-08-17 DIAGNOSIS — L821 Other seborrheic keratosis: Secondary | ICD-10-CM | POA: Diagnosis not present

## 2021-08-17 DIAGNOSIS — E349 Endocrine disorder, unspecified: Secondary | ICD-10-CM | POA: Diagnosis not present

## 2021-08-17 DIAGNOSIS — L820 Inflamed seborrheic keratosis: Secondary | ICD-10-CM | POA: Diagnosis not present

## 2021-08-17 DIAGNOSIS — D485 Neoplasm of uncertain behavior of skin: Secondary | ICD-10-CM | POA: Diagnosis not present

## 2021-08-17 DIAGNOSIS — I10 Essential (primary) hypertension: Secondary | ICD-10-CM | POA: Diagnosis not present

## 2021-08-22 DIAGNOSIS — H401113 Primary open-angle glaucoma, right eye, severe stage: Secondary | ICD-10-CM | POA: Diagnosis not present

## 2021-08-22 DIAGNOSIS — H401121 Primary open-angle glaucoma, left eye, mild stage: Secondary | ICD-10-CM | POA: Diagnosis not present

## 2021-09-05 DIAGNOSIS — I10 Essential (primary) hypertension: Secondary | ICD-10-CM | POA: Diagnosis not present

## 2021-09-15 DIAGNOSIS — H35352 Cystoid macular degeneration, left eye: Secondary | ICD-10-CM | POA: Diagnosis not present

## 2021-09-15 DIAGNOSIS — H04123 Dry eye syndrome of bilateral lacrimal glands: Secondary | ICD-10-CM | POA: Diagnosis not present

## 2021-09-15 DIAGNOSIS — H35373 Puckering of macula, bilateral: Secondary | ICD-10-CM | POA: Diagnosis not present

## 2021-09-15 DIAGNOSIS — H353231 Exudative age-related macular degeneration, bilateral, with active choroidal neovascularization: Secondary | ICD-10-CM | POA: Diagnosis not present

## 2021-09-15 DIAGNOSIS — H3562 Retinal hemorrhage, left eye: Secondary | ICD-10-CM | POA: Diagnosis not present

## 2021-09-15 DIAGNOSIS — H3582 Retinal ischemia: Secondary | ICD-10-CM | POA: Diagnosis not present

## 2021-09-15 DIAGNOSIS — H34812 Central retinal vein occlusion, left eye, with macular edema: Secondary | ICD-10-CM | POA: Diagnosis not present

## 2021-09-16 DIAGNOSIS — E349 Endocrine disorder, unspecified: Secondary | ICD-10-CM | POA: Diagnosis not present

## 2021-10-05 DIAGNOSIS — I10 Essential (primary) hypertension: Secondary | ICD-10-CM | POA: Diagnosis not present

## 2021-10-13 DIAGNOSIS — H353231 Exudative age-related macular degeneration, bilateral, with active choroidal neovascularization: Secondary | ICD-10-CM | POA: Diagnosis not present

## 2021-10-20 DIAGNOSIS — E349 Endocrine disorder, unspecified: Secondary | ICD-10-CM | POA: Diagnosis not present

## 2021-11-01 DIAGNOSIS — Z299 Encounter for prophylactic measures, unspecified: Secondary | ICD-10-CM | POA: Diagnosis not present

## 2021-11-01 DIAGNOSIS — R5383 Other fatigue: Secondary | ICD-10-CM | POA: Diagnosis not present

## 2021-11-01 DIAGNOSIS — Z6831 Body mass index (BMI) 31.0-31.9, adult: Secondary | ICD-10-CM | POA: Diagnosis not present

## 2021-11-01 DIAGNOSIS — F419 Anxiety disorder, unspecified: Secondary | ICD-10-CM | POA: Diagnosis not present

## 2021-11-01 DIAGNOSIS — I10 Essential (primary) hypertension: Secondary | ICD-10-CM | POA: Diagnosis not present

## 2021-11-04 DIAGNOSIS — F329 Major depressive disorder, single episode, unspecified: Secondary | ICD-10-CM | POA: Diagnosis not present

## 2021-11-04 DIAGNOSIS — E349 Endocrine disorder, unspecified: Secondary | ICD-10-CM | POA: Diagnosis not present

## 2021-11-04 DIAGNOSIS — I10 Essential (primary) hypertension: Secondary | ICD-10-CM | POA: Diagnosis not present

## 2021-11-04 DIAGNOSIS — H409 Unspecified glaucoma: Secondary | ICD-10-CM | POA: Diagnosis not present

## 2021-11-10 DIAGNOSIS — H35373 Puckering of macula, bilateral: Secondary | ICD-10-CM | POA: Diagnosis not present

## 2021-11-10 DIAGNOSIS — H401121 Primary open-angle glaucoma, left eye, mild stage: Secondary | ICD-10-CM | POA: Diagnosis not present

## 2021-11-10 DIAGNOSIS — H35353 Cystoid macular degeneration, bilateral: Secondary | ICD-10-CM | POA: Diagnosis not present

## 2021-11-10 DIAGNOSIS — H3582 Retinal ischemia: Secondary | ICD-10-CM | POA: Diagnosis not present

## 2021-11-10 DIAGNOSIS — H353231 Exudative age-related macular degeneration, bilateral, with active choroidal neovascularization: Secondary | ICD-10-CM | POA: Diagnosis not present

## 2021-11-10 DIAGNOSIS — H35351 Cystoid macular degeneration, right eye: Secondary | ICD-10-CM | POA: Diagnosis not present

## 2021-11-10 DIAGNOSIS — H34812 Central retinal vein occlusion, left eye, with macular edema: Secondary | ICD-10-CM | POA: Diagnosis not present

## 2021-11-10 DIAGNOSIS — H3562 Retinal hemorrhage, left eye: Secondary | ICD-10-CM | POA: Diagnosis not present

## 2021-11-21 DIAGNOSIS — E349 Endocrine disorder, unspecified: Secondary | ICD-10-CM | POA: Diagnosis not present

## 2021-12-04 DIAGNOSIS — I10 Essential (primary) hypertension: Secondary | ICD-10-CM | POA: Diagnosis not present

## 2021-12-05 DIAGNOSIS — H353231 Exudative age-related macular degeneration, bilateral, with active choroidal neovascularization: Secondary | ICD-10-CM | POA: Diagnosis not present

## 2021-12-05 DIAGNOSIS — H401122 Primary open-angle glaucoma, left eye, moderate stage: Secondary | ICD-10-CM | POA: Diagnosis not present

## 2021-12-05 DIAGNOSIS — H401113 Primary open-angle glaucoma, right eye, severe stage: Secondary | ICD-10-CM | POA: Diagnosis not present

## 2021-12-08 DIAGNOSIS — H353231 Exudative age-related macular degeneration, bilateral, with active choroidal neovascularization: Secondary | ICD-10-CM | POA: Diagnosis not present

## 2021-12-22 DIAGNOSIS — E349 Endocrine disorder, unspecified: Secondary | ICD-10-CM | POA: Diagnosis not present

## 2022-01-03 DIAGNOSIS — I10 Essential (primary) hypertension: Secondary | ICD-10-CM | POA: Diagnosis not present

## 2022-01-05 DIAGNOSIS — H353231 Exudative age-related macular degeneration, bilateral, with active choroidal neovascularization: Secondary | ICD-10-CM | POA: Diagnosis not present

## 2022-01-19 DIAGNOSIS — E349 Endocrine disorder, unspecified: Secondary | ICD-10-CM | POA: Diagnosis not present

## 2022-01-25 DIAGNOSIS — Z6831 Body mass index (BMI) 31.0-31.9, adult: Secondary | ICD-10-CM | POA: Diagnosis not present

## 2022-01-25 DIAGNOSIS — Z1331 Encounter for screening for depression: Secondary | ICD-10-CM | POA: Diagnosis not present

## 2022-01-25 DIAGNOSIS — Z7189 Other specified counseling: Secondary | ICD-10-CM | POA: Diagnosis not present

## 2022-01-25 DIAGNOSIS — Z299 Encounter for prophylactic measures, unspecified: Secondary | ICD-10-CM | POA: Diagnosis not present

## 2022-01-25 DIAGNOSIS — E7800 Pure hypercholesterolemia, unspecified: Secondary | ICD-10-CM | POA: Diagnosis not present

## 2022-01-25 DIAGNOSIS — E039 Hypothyroidism, unspecified: Secondary | ICD-10-CM | POA: Diagnosis not present

## 2022-01-25 DIAGNOSIS — Z1339 Encounter for screening examination for other mental health and behavioral disorders: Secondary | ICD-10-CM | POA: Diagnosis not present

## 2022-01-25 DIAGNOSIS — E349 Endocrine disorder, unspecified: Secondary | ICD-10-CM | POA: Diagnosis not present

## 2022-01-25 DIAGNOSIS — Z125 Encounter for screening for malignant neoplasm of prostate: Secondary | ICD-10-CM | POA: Diagnosis not present

## 2022-01-25 DIAGNOSIS — R5383 Other fatigue: Secondary | ICD-10-CM | POA: Diagnosis not present

## 2022-01-25 DIAGNOSIS — I10 Essential (primary) hypertension: Secondary | ICD-10-CM | POA: Diagnosis not present

## 2022-01-25 DIAGNOSIS — Z0000 Encounter for general adult medical examination without abnormal findings: Secondary | ICD-10-CM | POA: Diagnosis not present

## 2022-01-25 DIAGNOSIS — Z79899 Other long term (current) drug therapy: Secondary | ICD-10-CM | POA: Diagnosis not present

## 2022-01-25 DIAGNOSIS — Z789 Other specified health status: Secondary | ICD-10-CM | POA: Diagnosis not present

## 2022-02-02 DIAGNOSIS — H34812 Central retinal vein occlusion, left eye, with macular edema: Secondary | ICD-10-CM | POA: Diagnosis not present

## 2022-02-02 DIAGNOSIS — Z79899 Other long term (current) drug therapy: Secondary | ICD-10-CM | POA: Diagnosis not present

## 2022-02-02 DIAGNOSIS — H3562 Retinal hemorrhage, left eye: Secondary | ICD-10-CM | POA: Diagnosis not present

## 2022-02-02 DIAGNOSIS — H35373 Puckering of macula, bilateral: Secondary | ICD-10-CM | POA: Diagnosis not present

## 2022-02-02 DIAGNOSIS — H04123 Dry eye syndrome of bilateral lacrimal glands: Secondary | ICD-10-CM | POA: Diagnosis not present

## 2022-02-02 DIAGNOSIS — H353231 Exudative age-related macular degeneration, bilateral, with active choroidal neovascularization: Secondary | ICD-10-CM | POA: Diagnosis not present

## 2022-02-02 DIAGNOSIS — H3582 Retinal ischemia: Secondary | ICD-10-CM | POA: Diagnosis not present

## 2022-02-02 DIAGNOSIS — H35353 Cystoid macular degeneration, bilateral: Secondary | ICD-10-CM | POA: Diagnosis not present

## 2022-02-02 DIAGNOSIS — I10 Essential (primary) hypertension: Secondary | ICD-10-CM | POA: Diagnosis not present

## 2022-02-09 DIAGNOSIS — R059 Cough, unspecified: Secondary | ICD-10-CM | POA: Diagnosis not present

## 2022-02-09 DIAGNOSIS — H6123 Impacted cerumen, bilateral: Secondary | ICD-10-CM | POA: Diagnosis not present

## 2022-02-09 DIAGNOSIS — Z299 Encounter for prophylactic measures, unspecified: Secondary | ICD-10-CM | POA: Diagnosis not present

## 2022-02-09 DIAGNOSIS — R5383 Other fatigue: Secondary | ICD-10-CM | POA: Diagnosis not present

## 2022-03-02 DIAGNOSIS — H3562 Retinal hemorrhage, left eye: Secondary | ICD-10-CM | POA: Diagnosis not present

## 2022-03-02 DIAGNOSIS — H35351 Cystoid macular degeneration, right eye: Secondary | ICD-10-CM | POA: Diagnosis not present

## 2022-03-02 DIAGNOSIS — H3582 Retinal ischemia: Secondary | ICD-10-CM | POA: Diagnosis not present

## 2022-03-02 DIAGNOSIS — H35373 Puckering of macula, bilateral: Secondary | ICD-10-CM | POA: Diagnosis not present

## 2022-03-02 DIAGNOSIS — H353231 Exudative age-related macular degeneration, bilateral, with active choroidal neovascularization: Secondary | ICD-10-CM | POA: Diagnosis not present

## 2022-03-02 DIAGNOSIS — H04123 Dry eye syndrome of bilateral lacrimal glands: Secondary | ICD-10-CM | POA: Diagnosis not present

## 2022-03-02 DIAGNOSIS — H34812 Central retinal vein occlusion, left eye, with macular edema: Secondary | ICD-10-CM | POA: Diagnosis not present

## 2022-03-05 DIAGNOSIS — E349 Endocrine disorder, unspecified: Secondary | ICD-10-CM | POA: Diagnosis not present

## 2022-03-05 DIAGNOSIS — I10 Essential (primary) hypertension: Secondary | ICD-10-CM | POA: Diagnosis not present

## 2022-03-05 DIAGNOSIS — F329 Major depressive disorder, single episode, unspecified: Secondary | ICD-10-CM | POA: Diagnosis not present

## 2022-03-05 DIAGNOSIS — H409 Unspecified glaucoma: Secondary | ICD-10-CM | POA: Diagnosis not present

## 2022-03-13 DIAGNOSIS — H401122 Primary open-angle glaucoma, left eye, moderate stage: Secondary | ICD-10-CM | POA: Diagnosis not present

## 2022-03-17 DIAGNOSIS — R7989 Other specified abnormal findings of blood chemistry: Secondary | ICD-10-CM | POA: Diagnosis not present

## 2022-03-27 DIAGNOSIS — E349 Endocrine disorder, unspecified: Secondary | ICD-10-CM | POA: Diagnosis not present

## 2022-03-27 DIAGNOSIS — Z299 Encounter for prophylactic measures, unspecified: Secondary | ICD-10-CM | POA: Diagnosis not present

## 2022-03-27 DIAGNOSIS — I10 Essential (primary) hypertension: Secondary | ICD-10-CM | POA: Diagnosis not present

## 2022-03-30 DIAGNOSIS — H3582 Retinal ischemia: Secondary | ICD-10-CM | POA: Diagnosis not present

## 2022-03-30 DIAGNOSIS — H3581 Retinal edema: Secondary | ICD-10-CM | POA: Diagnosis not present

## 2022-03-30 DIAGNOSIS — H3562 Retinal hemorrhage, left eye: Secondary | ICD-10-CM | POA: Diagnosis not present

## 2022-03-30 DIAGNOSIS — H34812 Central retinal vein occlusion, left eye, with macular edema: Secondary | ICD-10-CM | POA: Diagnosis not present

## 2022-03-30 DIAGNOSIS — H04123 Dry eye syndrome of bilateral lacrimal glands: Secondary | ICD-10-CM | POA: Diagnosis not present

## 2022-03-30 DIAGNOSIS — H353231 Exudative age-related macular degeneration, bilateral, with active choroidal neovascularization: Secondary | ICD-10-CM | POA: Diagnosis not present

## 2022-03-30 DIAGNOSIS — H35373 Puckering of macula, bilateral: Secondary | ICD-10-CM | POA: Diagnosis not present

## 2022-04-04 DIAGNOSIS — I10 Essential (primary) hypertension: Secondary | ICD-10-CM | POA: Diagnosis not present

## 2022-04-10 DIAGNOSIS — E349 Endocrine disorder, unspecified: Secondary | ICD-10-CM | POA: Diagnosis not present

## 2022-04-20 DIAGNOSIS — N401 Enlarged prostate with lower urinary tract symptoms: Secondary | ICD-10-CM | POA: Diagnosis not present

## 2022-04-20 DIAGNOSIS — R011 Cardiac murmur, unspecified: Secondary | ICD-10-CM | POA: Diagnosis not present

## 2022-04-20 DIAGNOSIS — I10 Essential (primary) hypertension: Secondary | ICD-10-CM | POA: Diagnosis not present

## 2022-04-20 DIAGNOSIS — Z713 Dietary counseling and surveillance: Secondary | ICD-10-CM | POA: Diagnosis not present

## 2022-04-20 DIAGNOSIS — E349 Endocrine disorder, unspecified: Secondary | ICD-10-CM | POA: Diagnosis not present

## 2022-04-20 DIAGNOSIS — Z299 Encounter for prophylactic measures, unspecified: Secondary | ICD-10-CM | POA: Diagnosis not present

## 2022-04-20 DIAGNOSIS — E7800 Pure hypercholesterolemia, unspecified: Secondary | ICD-10-CM | POA: Diagnosis not present

## 2022-04-20 DIAGNOSIS — Z6830 Body mass index (BMI) 30.0-30.9, adult: Secondary | ICD-10-CM | POA: Diagnosis not present

## 2022-04-20 DIAGNOSIS — R7301 Impaired fasting glucose: Secondary | ICD-10-CM | POA: Diagnosis not present

## 2022-05-02 DIAGNOSIS — E349 Endocrine disorder, unspecified: Secondary | ICD-10-CM | POA: Diagnosis not present

## 2022-05-04 DIAGNOSIS — H34812 Central retinal vein occlusion, left eye, with macular edema: Secondary | ICD-10-CM | POA: Diagnosis not present

## 2022-05-04 DIAGNOSIS — H3562 Retinal hemorrhage, left eye: Secondary | ICD-10-CM | POA: Diagnosis not present

## 2022-05-04 DIAGNOSIS — H35353 Cystoid macular degeneration, bilateral: Secondary | ICD-10-CM | POA: Diagnosis not present

## 2022-05-04 DIAGNOSIS — H353231 Exudative age-related macular degeneration, bilateral, with active choroidal neovascularization: Secondary | ICD-10-CM | POA: Diagnosis not present

## 2022-05-04 DIAGNOSIS — I10 Essential (primary) hypertension: Secondary | ICD-10-CM | POA: Diagnosis not present

## 2022-05-04 DIAGNOSIS — H3582 Retinal ischemia: Secondary | ICD-10-CM | POA: Diagnosis not present

## 2022-05-04 DIAGNOSIS — H35373 Puckering of macula, bilateral: Secondary | ICD-10-CM | POA: Diagnosis not present

## 2022-05-16 DIAGNOSIS — Z299 Encounter for prophylactic measures, unspecified: Secondary | ICD-10-CM | POA: Diagnosis not present

## 2022-05-16 DIAGNOSIS — N485 Ulcer of penis: Secondary | ICD-10-CM | POA: Diagnosis not present

## 2022-05-16 DIAGNOSIS — Z6830 Body mass index (BMI) 30.0-30.9, adult: Secondary | ICD-10-CM | POA: Diagnosis not present

## 2022-05-16 DIAGNOSIS — Z1331 Encounter for screening for depression: Secondary | ICD-10-CM | POA: Diagnosis not present

## 2022-05-16 DIAGNOSIS — I10 Essential (primary) hypertension: Secondary | ICD-10-CM | POA: Diagnosis not present

## 2022-05-16 DIAGNOSIS — E349 Endocrine disorder, unspecified: Secondary | ICD-10-CM | POA: Diagnosis not present

## 2022-05-30 DIAGNOSIS — E349 Endocrine disorder, unspecified: Secondary | ICD-10-CM | POA: Diagnosis not present

## 2022-06-01 DIAGNOSIS — H3582 Retinal ischemia: Secondary | ICD-10-CM | POA: Diagnosis not present

## 2022-06-01 DIAGNOSIS — H04123 Dry eye syndrome of bilateral lacrimal glands: Secondary | ICD-10-CM | POA: Diagnosis not present

## 2022-06-01 DIAGNOSIS — H35373 Puckering of macula, bilateral: Secondary | ICD-10-CM | POA: Diagnosis not present

## 2022-06-01 DIAGNOSIS — H34812 Central retinal vein occlusion, left eye, with macular edema: Secondary | ICD-10-CM | POA: Diagnosis not present

## 2022-06-01 DIAGNOSIS — H35351 Cystoid macular degeneration, right eye: Secondary | ICD-10-CM | POA: Diagnosis not present

## 2022-06-01 DIAGNOSIS — H3562 Retinal hemorrhage, left eye: Secondary | ICD-10-CM | POA: Diagnosis not present

## 2022-06-01 DIAGNOSIS — H353231 Exudative age-related macular degeneration, bilateral, with active choroidal neovascularization: Secondary | ICD-10-CM | POA: Diagnosis not present

## 2022-06-05 DIAGNOSIS — F329 Major depressive disorder, single episode, unspecified: Secondary | ICD-10-CM | POA: Diagnosis not present

## 2022-06-05 DIAGNOSIS — I10 Essential (primary) hypertension: Secondary | ICD-10-CM | POA: Diagnosis not present

## 2022-06-05 DIAGNOSIS — E039 Hypothyroidism, unspecified: Secondary | ICD-10-CM | POA: Diagnosis not present

## 2022-06-13 DIAGNOSIS — E349 Endocrine disorder, unspecified: Secondary | ICD-10-CM | POA: Diagnosis not present

## 2022-06-26 DIAGNOSIS — Z6830 Body mass index (BMI) 30.0-30.9, adult: Secondary | ICD-10-CM | POA: Diagnosis not present

## 2022-06-26 DIAGNOSIS — E349 Endocrine disorder, unspecified: Secondary | ICD-10-CM | POA: Diagnosis not present

## 2022-06-26 DIAGNOSIS — I10 Essential (primary) hypertension: Secondary | ICD-10-CM | POA: Diagnosis not present

## 2022-06-26 DIAGNOSIS — F17210 Nicotine dependence, cigarettes, uncomplicated: Secondary | ICD-10-CM | POA: Diagnosis not present

## 2022-06-26 DIAGNOSIS — F419 Anxiety disorder, unspecified: Secondary | ICD-10-CM | POA: Diagnosis not present

## 2022-06-26 DIAGNOSIS — Z299 Encounter for prophylactic measures, unspecified: Secondary | ICD-10-CM | POA: Diagnosis not present

## 2022-06-29 DIAGNOSIS — H353231 Exudative age-related macular degeneration, bilateral, with active choroidal neovascularization: Secondary | ICD-10-CM | POA: Diagnosis not present

## 2022-07-05 DIAGNOSIS — I10 Essential (primary) hypertension: Secondary | ICD-10-CM | POA: Diagnosis not present

## 2022-07-12 DIAGNOSIS — E349 Endocrine disorder, unspecified: Secondary | ICD-10-CM | POA: Diagnosis not present

## 2022-07-25 DIAGNOSIS — E349 Endocrine disorder, unspecified: Secondary | ICD-10-CM | POA: Diagnosis not present

## 2022-07-27 DIAGNOSIS — Z961 Presence of intraocular lens: Secondary | ICD-10-CM | POA: Diagnosis not present

## 2022-07-27 DIAGNOSIS — H3562 Retinal hemorrhage, left eye: Secondary | ICD-10-CM | POA: Diagnosis not present

## 2022-07-27 DIAGNOSIS — H35373 Puckering of macula, bilateral: Secondary | ICD-10-CM | POA: Diagnosis not present

## 2022-07-27 DIAGNOSIS — H35351 Cystoid macular degeneration, right eye: Secondary | ICD-10-CM | POA: Diagnosis not present

## 2022-07-27 DIAGNOSIS — H34812 Central retinal vein occlusion, left eye, with macular edema: Secondary | ICD-10-CM | POA: Diagnosis not present

## 2022-07-27 DIAGNOSIS — H35353 Cystoid macular degeneration, bilateral: Secondary | ICD-10-CM | POA: Diagnosis not present

## 2022-07-27 DIAGNOSIS — H3582 Retinal ischemia: Secondary | ICD-10-CM | POA: Diagnosis not present

## 2022-07-27 DIAGNOSIS — H353231 Exudative age-related macular degeneration, bilateral, with active choroidal neovascularization: Secondary | ICD-10-CM | POA: Diagnosis not present

## 2022-08-04 DIAGNOSIS — I10 Essential (primary) hypertension: Secondary | ICD-10-CM | POA: Diagnosis not present

## 2022-08-07 DIAGNOSIS — H401113 Primary open-angle glaucoma, right eye, severe stage: Secondary | ICD-10-CM | POA: Diagnosis not present

## 2022-08-07 DIAGNOSIS — H401122 Primary open-angle glaucoma, left eye, moderate stage: Secondary | ICD-10-CM | POA: Diagnosis not present

## 2022-08-08 DIAGNOSIS — E349 Endocrine disorder, unspecified: Secondary | ICD-10-CM | POA: Diagnosis not present

## 2022-08-22 DIAGNOSIS — E349 Endocrine disorder, unspecified: Secondary | ICD-10-CM | POA: Diagnosis not present

## 2022-09-04 DIAGNOSIS — E349 Endocrine disorder, unspecified: Secondary | ICD-10-CM | POA: Diagnosis not present

## 2022-09-04 DIAGNOSIS — R21 Rash and other nonspecific skin eruption: Secondary | ICD-10-CM | POA: Diagnosis not present

## 2022-09-04 DIAGNOSIS — I10 Essential (primary) hypertension: Secondary | ICD-10-CM | POA: Diagnosis not present

## 2022-09-04 DIAGNOSIS — Z299 Encounter for prophylactic measures, unspecified: Secondary | ICD-10-CM | POA: Diagnosis not present

## 2022-09-07 DIAGNOSIS — H3582 Retinal ischemia: Secondary | ICD-10-CM | POA: Diagnosis not present

## 2022-09-07 DIAGNOSIS — H04123 Dry eye syndrome of bilateral lacrimal glands: Secondary | ICD-10-CM | POA: Diagnosis not present

## 2022-09-07 DIAGNOSIS — H35351 Cystoid macular degeneration, right eye: Secondary | ICD-10-CM | POA: Diagnosis not present

## 2022-09-07 DIAGNOSIS — H35373 Puckering of macula, bilateral: Secondary | ICD-10-CM | POA: Diagnosis not present

## 2022-09-07 DIAGNOSIS — H3562 Retinal hemorrhage, left eye: Secondary | ICD-10-CM | POA: Diagnosis not present

## 2022-09-07 DIAGNOSIS — H353231 Exudative age-related macular degeneration, bilateral, with active choroidal neovascularization: Secondary | ICD-10-CM | POA: Diagnosis not present

## 2022-09-07 DIAGNOSIS — H34812 Central retinal vein occlusion, left eye, with macular edema: Secondary | ICD-10-CM | POA: Diagnosis not present

## 2022-09-18 DIAGNOSIS — B86 Scabies: Secondary | ICD-10-CM | POA: Diagnosis not present

## 2022-09-19 DIAGNOSIS — E349 Endocrine disorder, unspecified: Secondary | ICD-10-CM | POA: Diagnosis not present

## 2022-10-03 DIAGNOSIS — I10 Essential (primary) hypertension: Secondary | ICD-10-CM | POA: Diagnosis not present

## 2022-10-03 DIAGNOSIS — Z299 Encounter for prophylactic measures, unspecified: Secondary | ICD-10-CM | POA: Diagnosis not present

## 2022-10-03 DIAGNOSIS — E349 Endocrine disorder, unspecified: Secondary | ICD-10-CM | POA: Diagnosis not present

## 2022-10-03 DIAGNOSIS — B86 Scabies: Secondary | ICD-10-CM | POA: Diagnosis not present

## 2022-10-04 DIAGNOSIS — L308 Other specified dermatitis: Secondary | ICD-10-CM | POA: Diagnosis not present

## 2022-10-04 DIAGNOSIS — I10 Essential (primary) hypertension: Secondary | ICD-10-CM | POA: Diagnosis not present

## 2022-10-04 DIAGNOSIS — B86 Scabies: Secondary | ICD-10-CM | POA: Diagnosis not present

## 2022-10-17 DIAGNOSIS — E349 Endocrine disorder, unspecified: Secondary | ICD-10-CM | POA: Diagnosis not present

## 2022-10-19 DIAGNOSIS — H353231 Exudative age-related macular degeneration, bilateral, with active choroidal neovascularization: Secondary | ICD-10-CM | POA: Diagnosis not present

## 2022-10-25 DIAGNOSIS — L308 Other specified dermatitis: Secondary | ICD-10-CM | POA: Diagnosis not present

## 2022-10-31 DIAGNOSIS — E349 Endocrine disorder, unspecified: Secondary | ICD-10-CM | POA: Diagnosis not present

## 2022-11-16 DIAGNOSIS — E349 Endocrine disorder, unspecified: Secondary | ICD-10-CM | POA: Diagnosis not present

## 2022-11-17 DIAGNOSIS — L308 Other specified dermatitis: Secondary | ICD-10-CM | POA: Diagnosis not present

## 2022-11-17 DIAGNOSIS — L820 Inflamed seborrheic keratosis: Secondary | ICD-10-CM | POA: Diagnosis not present

## 2022-11-28 DIAGNOSIS — E349 Endocrine disorder, unspecified: Secondary | ICD-10-CM | POA: Diagnosis not present

## 2022-12-04 DIAGNOSIS — M9901 Segmental and somatic dysfunction of cervical region: Secondary | ICD-10-CM | POA: Diagnosis not present

## 2022-12-04 DIAGNOSIS — M5412 Radiculopathy, cervical region: Secondary | ICD-10-CM | POA: Diagnosis not present

## 2022-12-06 DIAGNOSIS — M5412 Radiculopathy, cervical region: Secondary | ICD-10-CM | POA: Diagnosis not present

## 2022-12-06 DIAGNOSIS — M9901 Segmental and somatic dysfunction of cervical region: Secondary | ICD-10-CM | POA: Diagnosis not present

## 2022-12-07 DIAGNOSIS — H35353 Cystoid macular degeneration, bilateral: Secondary | ICD-10-CM | POA: Diagnosis not present

## 2022-12-07 DIAGNOSIS — H401122 Primary open-angle glaucoma, left eye, moderate stage: Secondary | ICD-10-CM | POA: Diagnosis not present

## 2022-12-07 DIAGNOSIS — H3582 Retinal ischemia: Secondary | ICD-10-CM | POA: Diagnosis not present

## 2022-12-07 DIAGNOSIS — Z961 Presence of intraocular lens: Secondary | ICD-10-CM | POA: Diagnosis not present

## 2022-12-07 DIAGNOSIS — H3562 Retinal hemorrhage, left eye: Secondary | ICD-10-CM | POA: Diagnosis not present

## 2022-12-07 DIAGNOSIS — H353231 Exudative age-related macular degeneration, bilateral, with active choroidal neovascularization: Secondary | ICD-10-CM | POA: Diagnosis not present

## 2022-12-07 DIAGNOSIS — H34812 Central retinal vein occlusion, left eye, with macular edema: Secondary | ICD-10-CM | POA: Diagnosis not present

## 2022-12-07 DIAGNOSIS — H35373 Puckering of macula, bilateral: Secondary | ICD-10-CM | POA: Diagnosis not present

## 2022-12-11 DIAGNOSIS — H401122 Primary open-angle glaucoma, left eye, moderate stage: Secondary | ICD-10-CM | POA: Diagnosis not present

## 2022-12-11 DIAGNOSIS — H401113 Primary open-angle glaucoma, right eye, severe stage: Secondary | ICD-10-CM | POA: Diagnosis not present

## 2022-12-13 DIAGNOSIS — M5412 Radiculopathy, cervical region: Secondary | ICD-10-CM | POA: Diagnosis not present

## 2022-12-13 DIAGNOSIS — M9901 Segmental and somatic dysfunction of cervical region: Secondary | ICD-10-CM | POA: Diagnosis not present

## 2022-12-13 DIAGNOSIS — E349 Endocrine disorder, unspecified: Secondary | ICD-10-CM | POA: Diagnosis not present

## 2022-12-25 DIAGNOSIS — N5201 Erectile dysfunction due to arterial insufficiency: Secondary | ICD-10-CM | POA: Diagnosis not present

## 2022-12-25 DIAGNOSIS — N3941 Urge incontinence: Secondary | ICD-10-CM | POA: Diagnosis not present

## 2022-12-25 DIAGNOSIS — E291 Testicular hypofunction: Secondary | ICD-10-CM | POA: Diagnosis not present

## 2023-01-18 DIAGNOSIS — H353231 Exudative age-related macular degeneration, bilateral, with active choroidal neovascularization: Secondary | ICD-10-CM | POA: Diagnosis not present

## 2023-01-18 DIAGNOSIS — H3582 Retinal ischemia: Secondary | ICD-10-CM | POA: Diagnosis not present

## 2023-01-18 DIAGNOSIS — H35353 Cystoid macular degeneration, bilateral: Secondary | ICD-10-CM | POA: Diagnosis not present

## 2023-01-18 DIAGNOSIS — H35373 Puckering of macula, bilateral: Secondary | ICD-10-CM | POA: Diagnosis not present

## 2023-01-18 DIAGNOSIS — H34812 Central retinal vein occlusion, left eye, with macular edema: Secondary | ICD-10-CM | POA: Diagnosis not present

## 2023-01-18 DIAGNOSIS — H04123 Dry eye syndrome of bilateral lacrimal glands: Secondary | ICD-10-CM | POA: Diagnosis not present

## 2023-01-23 DIAGNOSIS — E291 Testicular hypofunction: Secondary | ICD-10-CM | POA: Diagnosis not present

## 2023-01-31 DIAGNOSIS — Z79899 Other long term (current) drug therapy: Secondary | ICD-10-CM | POA: Diagnosis not present

## 2023-01-31 DIAGNOSIS — E039 Hypothyroidism, unspecified: Secondary | ICD-10-CM | POA: Diagnosis not present

## 2023-01-31 DIAGNOSIS — Z125 Encounter for screening for malignant neoplasm of prostate: Secondary | ICD-10-CM | POA: Diagnosis not present

## 2023-01-31 DIAGNOSIS — R5383 Other fatigue: Secondary | ICD-10-CM | POA: Diagnosis not present

## 2023-01-31 DIAGNOSIS — E7800 Pure hypercholesterolemia, unspecified: Secondary | ICD-10-CM | POA: Diagnosis not present

## 2023-02-07 DIAGNOSIS — E349 Endocrine disorder, unspecified: Secondary | ICD-10-CM | POA: Diagnosis not present

## 2023-02-07 DIAGNOSIS — S76312D Strain of muscle, fascia and tendon of the posterior muscle group at thigh level, left thigh, subsequent encounter: Secondary | ICD-10-CM | POA: Diagnosis not present

## 2023-02-07 DIAGNOSIS — Z299 Encounter for prophylactic measures, unspecified: Secondary | ICD-10-CM | POA: Diagnosis not present

## 2023-02-07 DIAGNOSIS — R21 Rash and other nonspecific skin eruption: Secondary | ICD-10-CM | POA: Diagnosis not present

## 2023-02-13 DIAGNOSIS — E291 Testicular hypofunction: Secondary | ICD-10-CM | POA: Diagnosis not present

## 2023-02-20 DIAGNOSIS — E291 Testicular hypofunction: Secondary | ICD-10-CM | POA: Diagnosis not present

## 2023-02-22 DIAGNOSIS — H353231 Exudative age-related macular degeneration, bilateral, with active choroidal neovascularization: Secondary | ICD-10-CM | POA: Diagnosis not present

## 2023-02-26 DIAGNOSIS — M545 Low back pain, unspecified: Secondary | ICD-10-CM | POA: Diagnosis not present

## 2023-03-06 DIAGNOSIS — E291 Testicular hypofunction: Secondary | ICD-10-CM | POA: Diagnosis not present

## 2023-03-28 DIAGNOSIS — M545 Low back pain, unspecified: Secondary | ICD-10-CM | POA: Diagnosis not present

## 2023-04-03 DIAGNOSIS — M545 Low back pain, unspecified: Secondary | ICD-10-CM | POA: Diagnosis not present

## 2023-04-04 DIAGNOSIS — M5451 Vertebrogenic low back pain: Secondary | ICD-10-CM | POA: Diagnosis not present

## 2023-04-05 DIAGNOSIS — H35373 Puckering of macula, bilateral: Secondary | ICD-10-CM | POA: Diagnosis not present

## 2023-04-05 DIAGNOSIS — H3582 Retinal ischemia: Secondary | ICD-10-CM | POA: Diagnosis not present

## 2023-04-05 DIAGNOSIS — H3562 Retinal hemorrhage, left eye: Secondary | ICD-10-CM | POA: Diagnosis not present

## 2023-04-05 DIAGNOSIS — H35353 Cystoid macular degeneration, bilateral: Secondary | ICD-10-CM | POA: Diagnosis not present

## 2023-04-05 DIAGNOSIS — H353231 Exudative age-related macular degeneration, bilateral, with active choroidal neovascularization: Secondary | ICD-10-CM | POA: Diagnosis not present

## 2023-04-05 DIAGNOSIS — I10 Essential (primary) hypertension: Secondary | ICD-10-CM | POA: Diagnosis not present

## 2023-04-05 DIAGNOSIS — H34812 Central retinal vein occlusion, left eye, with macular edema: Secondary | ICD-10-CM | POA: Diagnosis not present

## 2023-04-12 DIAGNOSIS — H34812 Central retinal vein occlusion, left eye, with macular edema: Secondary | ICD-10-CM | POA: Diagnosis not present

## 2023-04-12 DIAGNOSIS — H353231 Exudative age-related macular degeneration, bilateral, with active choroidal neovascularization: Secondary | ICD-10-CM | POA: Diagnosis not present

## 2023-04-12 DIAGNOSIS — H35373 Puckering of macula, bilateral: Secondary | ICD-10-CM | POA: Diagnosis not present

## 2023-04-12 DIAGNOSIS — H3562 Retinal hemorrhage, left eye: Secondary | ICD-10-CM | POA: Diagnosis not present

## 2023-04-12 DIAGNOSIS — H5316 Psychophysical visual disturbances: Secondary | ICD-10-CM | POA: Diagnosis not present

## 2023-04-12 DIAGNOSIS — H35353 Cystoid macular degeneration, bilateral: Secondary | ICD-10-CM | POA: Diagnosis not present

## 2023-04-12 DIAGNOSIS — H3582 Retinal ischemia: Secondary | ICD-10-CM | POA: Diagnosis not present

## 2023-04-16 DIAGNOSIS — H401122 Primary open-angle glaucoma, left eye, moderate stage: Secondary | ICD-10-CM | POA: Diagnosis not present

## 2023-04-16 DIAGNOSIS — H401113 Primary open-angle glaucoma, right eye, severe stage: Secondary | ICD-10-CM | POA: Diagnosis not present

## 2023-04-17 DIAGNOSIS — M5416 Radiculopathy, lumbar region: Secondary | ICD-10-CM | POA: Diagnosis not present

## 2023-05-01 DIAGNOSIS — M5451 Vertebrogenic low back pain: Secondary | ICD-10-CM | POA: Diagnosis not present

## 2023-05-09 DIAGNOSIS — M5416 Radiculopathy, lumbar region: Secondary | ICD-10-CM | POA: Diagnosis not present

## 2023-05-17 DIAGNOSIS — H353231 Exudative age-related macular degeneration, bilateral, with active choroidal neovascularization: Secondary | ICD-10-CM | POA: Diagnosis not present

## 2023-05-19 DIAGNOSIS — M5416 Radiculopathy, lumbar region: Secondary | ICD-10-CM | POA: Diagnosis not present

## 2023-06-08 DIAGNOSIS — M5451 Vertebrogenic low back pain: Secondary | ICD-10-CM | POA: Diagnosis not present

## 2023-06-08 DIAGNOSIS — M48062 Spinal stenosis, lumbar region with neurogenic claudication: Secondary | ICD-10-CM | POA: Diagnosis not present

## 2023-06-11 DIAGNOSIS — I10 Essential (primary) hypertension: Secondary | ICD-10-CM | POA: Diagnosis not present

## 2023-06-11 DIAGNOSIS — M48061 Spinal stenosis, lumbar region without neurogenic claudication: Secondary | ICD-10-CM | POA: Diagnosis not present

## 2023-06-11 DIAGNOSIS — F419 Anxiety disorder, unspecified: Secondary | ICD-10-CM | POA: Diagnosis not present

## 2023-06-11 DIAGNOSIS — Z299 Encounter for prophylactic measures, unspecified: Secondary | ICD-10-CM | POA: Diagnosis not present

## 2023-06-11 DIAGNOSIS — F17210 Nicotine dependence, cigarettes, uncomplicated: Secondary | ICD-10-CM | POA: Diagnosis not present

## 2023-06-11 DIAGNOSIS — E039 Hypothyroidism, unspecified: Secondary | ICD-10-CM | POA: Diagnosis not present

## 2023-06-14 DIAGNOSIS — H04123 Dry eye syndrome of bilateral lacrimal glands: Secondary | ICD-10-CM | POA: Diagnosis not present

## 2023-06-14 DIAGNOSIS — H353231 Exudative age-related macular degeneration, bilateral, with active choroidal neovascularization: Secondary | ICD-10-CM | POA: Diagnosis not present

## 2023-06-14 DIAGNOSIS — H3562 Retinal hemorrhage, left eye: Secondary | ICD-10-CM | POA: Diagnosis not present

## 2023-06-14 DIAGNOSIS — H3582 Retinal ischemia: Secondary | ICD-10-CM | POA: Diagnosis not present

## 2023-06-14 DIAGNOSIS — H35373 Puckering of macula, bilateral: Secondary | ICD-10-CM | POA: Diagnosis not present

## 2023-06-14 DIAGNOSIS — H3581 Retinal edema: Secondary | ICD-10-CM | POA: Diagnosis not present

## 2023-06-14 DIAGNOSIS — H34812 Central retinal vein occlusion, left eye, with macular edema: Secondary | ICD-10-CM | POA: Diagnosis not present

## 2023-06-20 DIAGNOSIS — E291 Testicular hypofunction: Secondary | ICD-10-CM | POA: Diagnosis not present

## 2023-06-27 DIAGNOSIS — N5201 Erectile dysfunction due to arterial insufficiency: Secondary | ICD-10-CM | POA: Diagnosis not present

## 2023-06-27 DIAGNOSIS — E291 Testicular hypofunction: Secondary | ICD-10-CM | POA: Diagnosis not present

## 2023-06-27 DIAGNOSIS — R3915 Urgency of urination: Secondary | ICD-10-CM | POA: Diagnosis not present

## 2023-07-02 NOTE — Progress Notes (Signed)
LVM for Cordelia Pen, at Dr. Ermelinda Das office, letting her know that this pt needs surgical orders.

## 2023-07-02 NOTE — Pre-Procedure Instructions (Signed)
Surgical Instructions   Your procedure is scheduled on Thursday, September 5th. Report to [facility] Main Entrance "A" at 05:30 A.M., then check in with the Admitting office. Any questions or running late day of surgery: call [phone]  Questions prior to your surgery date: call [phone], Monday-Friday, 8am-4pm. If you experience any cold or flu symptoms such as cough, fever, chills, shortness of breath, etc. between now and your scheduled surgery, please notify us at the above number.     Remember:  Do not eat after midnight the night before your surgery  You may drink clear liquids until 04:30 AM the morning of your surgery.   Clear liquids allowed are: Water, Non-Citrus Juices (without pulp), Carbonated Beverages, Clear Tea, Black Coffee Only (NO MILK, CREAM OR POWDERED CREAMER of any kind), and Gatorade.    Take these medicines the morning of surgery with A SIP OF WATER  ARTIFICIAL TEAR SOLUTION OP  dorzolamide-timolol (COSOPT)   May take these medicines IF NEEDED: ALPRAZolam Prudy Feeler)    One week prior to surgery, STOP taking any Aspirin (unless otherwise instructed by your surgeon) Aleve, Naproxen, Ibuprofen, Motrin, Advil, Goody's, BC's, all herbal medications, fish oil, and non-prescription vitamins.                     Do NOT Smoke (Tobacco/Vaping) for 24 hours prior to your procedure.  If you use a CPAP at night, you may bring your mask/headgear for your overnight stay.   You will be asked to remove any contacts, glasses, piercing's, hearing aid's, dentures/partials prior to surgery. Please bring cases for these items if needed.    Patients discharged the day of surgery will not be allowed to drive home, and someone needs to stay with them for 24 hours.  SURGICAL WAITING ROOM VISITATION Patients may have no more than 2 support people in the waiting area - these visitors may rotate.   Pre-op nurse will coordinate an appropriate time for 1 ADULT support person, who  may not rotate, to accompany patient in pre-op.  Children under the age of 27 must have an adult with them who is not the patient and must remain in the main waiting area with an adult.  If the patient needs to stay at the hospital during part of their recovery, the visitor guidelines for inpatient rooms apply.  Please refer to the [facility] website for the visitor guidelines for any additional information.   If you received a COVID test during your pre-op visit  it is requested that you wear a mask when out in public, stay away from anyone that may not be feeling well and notify your surgeon if you develop symptoms. If you have been in contact with anyone that has tested positive in the last 10 days please notify you surgeon.      Pre-operative 5 CHG Bathing Instructions   You can play a key role in reducing the risk of infection after surgery. Your skin needs to be as free of germs as possible. You can reduce the number of germs on your skin by washing with CHG (chlorhexidine gluconate) soap before surgery. CHG is an antiseptic soap that kills germs and continues to kill germs even after washing.   DO NOT use if you have an allergy to chlorhexidine/CHG or antibacterial soaps. If your skin becomes reddened or irritated, stop using the CHG and notify one of our RNs at [phone].   Please shower with the CHG soap starting 4 days  before surgery using the following schedule:     Please keep in mind the following:  DO NOT shave, including legs and underarms, starting the day of your first shower.   You may shave your face at any point before/day of surgery.  Place clean sheets on your bed the day you start using CHG soap. Use a clean washcloth (not used since being washed) for each shower. DO NOT sleep with pets once you start using the CHG.   CHG Shower Instructions:  If you choose to wash your hair and private area, wash first with your normal shampoo/soap.  After you use  shampoo/soap, rinse your hair and body thoroughly to remove shampoo/soap residue.  Turn the water OFF and apply about 3 tablespoons (45 ml) of CHG soap to a CLEAN washcloth.  Apply CHG soap ONLY FROM YOUR NECK DOWN TO YOUR TOES (washing for 3-5 minutes)  DO NOT use CHG soap on face, private areas, open wounds, or sores.  Pay special attention to the area where your surgery is being performed.  If you are having back surgery, having someone wash your back for you may be helpful. Wait 2 minutes after CHG soap is applied, then you may rinse off the CHG soap.  Pat dry with a clean towel  Put on clean clothes/pajamas   If you choose to wear lotion, please use ONLY the CHG-compatible lotions on the back of this paper.   Additional instructions for the day of surgery: DO NOT APPLY any lotions, deodorants, cologne, or perfumes.   Do not bring valuables to the hospital. [facility] is not responsible for any belongings/valuables. Do not wear nail polish, gel polish, artificial nails, or any other type of covering on natural nails (fingers and toes) Do not wear jewelry or makeup Put on clean/comfortable clothes.  Please brush your teeth.  Ask your nurse before applying any prescription medications to the skin.     CHG Compatible Lotions   Aveeno Moisturizing lotion  Cetaphil Moisturizing Cream  Cetaphil Moisturizing Lotion  Clairol Herbal Essence Moisturizing Lotion, Dry Skin  Clairol Herbal Essence Moisturizing Lotion, Extra Dry Skin  Clairol Herbal Essence Moisturizing Lotion, Normal Skin  Curel Age Defying Therapeutic Moisturizing Lotion with Alpha Hydroxy  Curel Extreme Care Body Lotion  Curel Soothing Hands Moisturizing Hand Lotion  Curel Therapeutic Moisturizing Cream, Fragrance-Free  Curel Therapeutic Moisturizing Lotion, Fragrance-Free  Curel Therapeutic Moisturizing Lotion, Original Formula  Eucerin Daily Replenishing Lotion  Eucerin Dry Skin Therapy Plus Alpha Hydroxy Crme   Eucerin Dry Skin Therapy Plus Alpha Hydroxy Lotion  Eucerin Original Crme  Eucerin Original Lotion  Eucerin Plus Crme Eucerin Plus Lotion  Eucerin TriLipid Replenishing Lotion  Keri Anti-Bacterial Hand Lotion  Keri Deep Conditioning Original Lotion Dry Skin Formula Softly Scented  Keri Deep Conditioning Original Lotion, Fragrance Free Sensitive Skin Formula  Keri Lotion Fast Absorbing Fragrance Free Sensitive Skin Formula  Keri Lotion Fast Absorbing Softly Scented Dry Skin Formula  Keri Original Lotion  Keri Skin Renewal Lotion Keri Silky Smooth Lotion  Keri Silky Smooth Sensitive Skin Lotion  Nivea Body Creamy Conditioning Oil  Nivea Body Extra Enriched Lotion  Nivea Body Original Lotion  Nivea Body Sheer Moisturizing Lotion Nivea Crme  Nivea Skin Firming Lotion  NutraDerm 30 Skin Lotion  NutraDerm Skin Lotion  NutraDerm Therapeutic Skin Cream  NutraDerm Therapeutic Skin Lotion  ProShield Protective Hand Cream  Provon moisturizing lotion  Please read over the following fact sheets that you were given.

## 2023-07-03 ENCOUNTER — Encounter (HOSPITAL_COMMUNITY): Payer: Self-pay

## 2023-07-03 ENCOUNTER — Encounter (HOSPITAL_COMMUNITY)
Admission: RE | Admit: 2023-07-03 | Discharge: 2023-07-03 | Disposition: A | Discharge status: Home / Self Care | Payer: Medicare Other | Source: Ambulatory Visit | Attending: Specialist | Admitting: Specialist

## 2023-07-03 ENCOUNTER — Other Ambulatory Visit: Payer: Self-pay

## 2023-07-03 VITALS — BP 145/81 | HR 73 | Temp 98.2°F | Resp 17 | Ht 70.0 in | Wt 196.7 lb

## 2023-07-03 DIAGNOSIS — Z01818 Encounter for other preprocedural examination: Secondary | ICD-10-CM

## 2023-07-03 DIAGNOSIS — Z01812 Encounter for preprocedural laboratory examination: Secondary | ICD-10-CM | POA: Insufficient documentation

## 2023-07-03 HISTORY — DX: Pneumonia, unspecified organism: J18.9

## 2023-07-03 HISTORY — DX: Gastro-esophageal reflux disease without esophagitis: K21.9

## 2023-07-03 LAB — BASIC METABOLIC PANEL
Anion gap: 10 (ref 5–15)
BUN: 10 mg/dL (ref 8–23)
CO2: 22 mmol/L (ref 22–32)
Calcium: 8.2 mg/dL — ABNORMAL LOW (ref 8.9–10.3)
Chloride: 101 mmol/L (ref 98–111)
Creatinine, Ser: 1.31 mg/dL — ABNORMAL HIGH (ref 0.61–1.24)
GFR, Estimated: 56 mL/min — ABNORMAL LOW (ref >60)
Glucose, Bld: 136 mg/dL — ABNORMAL HIGH (ref 70–99)
Potassium: 3.3 mmol/L — ABNORMAL LOW (ref 3.5–5.1)
Sodium: 133 mmol/L — ABNORMAL LOW (ref 135–145)

## 2023-07-03 LAB — CBC
HCT: 49.6 % (ref 39.0–52.0)
Hemoglobin: 16.7 g/dL (ref 13.0–17.0)
MCH: 30.3 pg (ref 26.0–34.0)
MCHC: 33.7 g/dL (ref 30.0–36.0)
MCV: 90 fL (ref 80.0–100.0)
Platelets: 187 10*3/uL (ref 150–400)
RBC: 5.51 MIL/uL (ref 4.22–5.81)
RDW: 13.5 % (ref 11.5–15.5)
WBC: 8.9 10*3/uL (ref 4.0–10.5)
nRBC: 0 % (ref 0.0–0.2)

## 2023-07-03 LAB — SURGICAL PCR SCREEN
MRSA, PCR: NEGATIVE
Staphylococcus aureus: NEGATIVE

## 2023-07-03 NOTE — Progress Notes (Signed)
PCP - Ignatius Specking, MD Cardiologist - Denies  PPM/ICD - Denies  Chest x-ray - Denies EKG - Denies Stress Test - Denies ECHO - 08/28/2015 Cardiac Cath - Denies  Sleep Study - Denies  DM: Denies  Blood Thinner Instructions: N/A Aspirin Instructions: N/A  ERAS Protcol -Yes PRE-SURGERY Ensure or G2- N/A  COVID TEST- No   Anesthesia review: No  Patient denies shortness of breath, fever, cough and chest pain at PAT appointment   All instructions explained to the patient, with a verbal understanding of the material. Patient agrees to go over the instructions while at home for a better understanding. The opportunity to ask questions was provided.

## 2023-07-04 ENCOUNTER — Ambulatory Visit: Payer: Self-pay | Admitting: Orthopedic Surgery

## 2023-07-04 DIAGNOSIS — M48062 Spinal stenosis, lumbar region with neurogenic claudication: Secondary | ICD-10-CM

## 2023-07-04 NOTE — H&P (Signed)
Gilbert Rangel is an 77 y.o. male.   Chief Complaint: back and leg pain HPI: Reason for Visit: (normal) visit for: (back) Location (Lower Extremity): lower back pain ; leg pain bilateral, , Severity: pain level 3/10 Aggravating Factors: standing for ; walking for Alleviating Factors: PT/OT Associated Symptoms: no numbness/tingling; no weakness Medications: No medication for his back Notes: 05/19/23 T-F ESI L4-5 helped but states that pain is in other areas now  Follows up, reports some relief of his left leg symptoms with recent epidural, but he is still having some ongoing left leg pain just less severe. Also noting pain in the right posterior leg down to the calf as well as in the back itself. Continues to feel significantly limited by his inability to ambulate and stand up straight. He has been thinking about surgical options which Dr. Shelle Iron did previously discussed with him.  Past Medical History:  Diagnosis Date   GERD (gastroesophageal reflux disease)    Hypothyroidism    Pneumonia    Thyroid disease     Past Surgical History:  Procedure Laterality Date   CHOLECYSTECTOMY     ELBOW SURGERY Right    EYE SURGERY Bilateral    HERNIA REPAIR     KNEE ARTHROSCOPY W/ MENISCAL REPAIR     LUMBAR LAMINECTOMY/DECOMPRESSION MICRODISCECTOMY N/A 09/29/2015   Procedure: MICRO LUMBER DECOMPRESSION L5-S1 AND L4-5 ON RIGHT;  Surgeon: Jene Every, MD;  Location: [facility];  Service: Orthopedics;  Laterality: N/A;   REPAIR / REINSERT BICEPS TENDON AT ELBOW     ROTATOR CUFF REPAIR     stomach     TONSILLECTOMY      Family History  Problem Relation Age of Onset   Diabetes Mother    Cancer Father    Social History:  reports that he has never smoked. His smokeless tobacco use includes snuff. He reports current alcohol use. He reports that he does not use drugs.  Allergies:  Allergies  Allergen Reactions   Apraclonidine     Redness    Brimonidine     redness   Netarsudil     redness    Methazolamide Dermatitis    Current meds: ALPRAZolam levothyroxine   Results for orders placed or performed during the hospital encounter of 07/03/23 (from the past 48 hour(s))  Surgical pcr screen     Status: None   Collection Time: 07/03/23  9:26 AM   Specimen: Nasal Mucosa; Nasal Swab  Result Value Ref Range   MRSA, PCR NEGATIVE NEGATIVE   Staphylococcus aureus NEGATIVE NEGATIVE    Comment: (NOTE) The Xpert SA Assay (FDA approved for NASAL specimens in patients 83 years of age and older), is one component of a comprehensive surveillance program. It is not intended to diagnose infection nor to guide or monitor treatment. Performed at [facility], [address], [place], [place] [address]   Basic metabolic panel per protocol     Status: Abnormal   Collection Time: 07/03/23  9:45 AM  Result Value Ref Range   Sodium 133 (L) 135 - 145 mmol/L   Potassium 3.3 (L) 3.5 - 5.1 mmol/L   Chloride 101 98 - 111 mmol/L   CO2 22 22 - 32 mmol/L   Glucose, Bld 136 (H) 70 - 99 mg/dL    Comment: Glucose reference range applies only to samples taken after fasting for at least 8 hours.   BUN 10 8 - 23 mg/dL   Creatinine, Ser 6.04 (H) 0.61 - 1.24 mg/dL  Calcium 8.2 (L) 8.9 - 10.3 mg/dL   GFR, Estimated 56 (L) >60 mL/min    Comment: (NOTE) Calculated using the CKD-EPI Creatinine Equation (2021)    Anion gap 10 5 - 15    Comment: Performed at [facility], [address] [place], [place] [address]  CBC per protocol     Status: None   Collection Time: 07/03/23  9:45 AM  Result Value Ref Range   WBC 8.9 4.0 - 10.5 K/uL   RBC 5.51 4.22 - 5.81 MIL/uL   Hemoglobin 16.7 13.0 - 17.0 g/dL   HCT 44.0 34.7 - 42.5 %   MCV 90.0 80.0 - 100.0 fL   MCH 30.3 26.0 - 34.0 pg   MCHC 33.7 30.0 - 36.0 g/dL   RDW 95.6 38.7 - 56.4 %   Platelets 187 150 - 400 K/uL   nRBC 0.0 0.0 - 0.2 %    Comment: Performed at [facility], [address] [place], [place] [address]   No results  found.  Review of Systems  Constitutional: Negative.   HENT: Negative.    Eyes: Negative.   Respiratory: Negative.    Cardiovascular: Negative.   Gastrointestinal: Negative.   Endocrine: Negative.   Genitourinary: Negative.   Musculoskeletal:  Positive for back pain and gait problem.  Skin: Negative.   Neurological:  Positive for weakness and numbness.  Psychiatric/Behavioral: Negative.      There were no vitals taken for this visit. Physical Exam Constitutional:      Appearance: Normal appearance.  HENT:     Head: Normocephalic and atraumatic.     Right Ear: External ear normal.     Left Ear: External ear normal.     Nose: Nose normal.     Mouth/Throat:     Pharynx: Oropharynx is clear.  Eyes:     Conjunctiva/sclera: Conjunctivae normal.  Cardiovascular:     Rate and Rhythm: Normal rate.     Pulses: Normal pulses.  Pulmonary:     Effort: Pulmonary effort is normal.  Abdominal:     General: Abdomen is flat.  Musculoskeletal:     Cervical back: Normal range of motion.     Comments: Patient is awake, alert, and oriented 3. Well-nourished and well-developed. Pleasant. Ambulates with forward flexion in the lumbar spine, antalgic  On examination of the lumbar spine, tender to palpation through the spinous processes and paraspinous musculature. Nontender bilateral buttocks and over the greater trochanter of the hips. Decreased flexion and extension lumbar spine. Positive seated straight leg raise bilaterally with buttock and leg pain. No lower extremity weakness noted, 5/5 throughout the hip flexor, quads, hamstrings, plantar and dorsiflexion, and EHL. No groin pain with rotation of the hips bilaterally. Patellar and Achilles reflexes 2+. No clonus present, negative Babinski. Sensation intact distally. No calf pain or sign of DVT.  Skin:    General: Skin is warm and dry.  Neurological:     Mental Status: He is alert.    Prior imaging again reviewed. Three-view x-rays  demonstrate multilevel disc degeneration.. End-stage at L2-3 L3-4 and L4-5. There is mild calcification of the aorta. No instability in flexion extension. A dextrorotatory scoliosis MRI demonstrates severe multifactorial spinal stenosis at L4-5 and moderately severe at L3-4.  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 Symptoms interfering with quality of life and activities of daily living at this point  Plan: We again discussed relevant  anatomy and etiology of his symptoms as well as treatment options. Given that he has failed conservative treatment as listed above, Dr. Shelle Iron previously discussed with him possible need for surgical decompression. We discussed decompression L4-5, possibly L3-4 with the patient and his son. We discussed the procedure itself as well as risks, complications and alternatives including but not limited to DVT, PE, failure of procedure, need for secondary procedure, anesthesia risk, even death. Questions were invited and answered. We will obtain clearance from PCP and then proceed accordingly with scheduling microlumbar decompression.  Plan microlumbar decompression L4-5, possible L3-4  Dorothy Spark, PA-C for Dr Shelle Iron 07/04/2023, 12:36 PM

## 2023-07-04 NOTE — H&P (View-Only) (Signed)
 Gilbert Rangel is an 77 y.o. male.   Chief Complaint: back and leg pain HPI: Reason for Visit: (normal) visit for: (back) Location (Lower Extremity): lower back pain ; leg pain bilateral, , Severity: pain level 3/10 Aggravating Factors: standing for ; walking for Alleviating Factors: PT/OT Associated Symptoms: no numbness/tingling; no weakness Medications: No medication for his back Notes: 05/19/23 T-F ESI L4-5 helped but states that pain is in other areas now  Follows up, reports some relief of his left leg symptoms with recent epidural, but he is still having some ongoing left leg pain just less severe. Also noting pain in the right posterior leg down to the calf as well as in the back itself. Continues to feel significantly limited by his inability to ambulate and stand up straight. He has been thinking about surgical options which Dr. Shelle Iron did previously discussed with him.  Past Medical History:  Diagnosis Date   GERD (gastroesophageal reflux disease)    Hypothyroidism    Pneumonia    Thyroid disease     Past Surgical History:  Procedure Laterality Date   CHOLECYSTECTOMY     ELBOW SURGERY Right    EYE SURGERY Bilateral    HERNIA REPAIR     KNEE ARTHROSCOPY W/ MENISCAL REPAIR     LUMBAR LAMINECTOMY/DECOMPRESSION MICRODISCECTOMY N/A 09/29/2015   Procedure: MICRO LUMBER DECOMPRESSION L5-S1 AND L4-5 ON RIGHT;  Surgeon: Jene Every, MD;  Location: [facility];  Service: Orthopedics;  Laterality: N/A;   REPAIR / REINSERT BICEPS TENDON AT ELBOW     ROTATOR CUFF REPAIR     stomach     TONSILLECTOMY      Family History  Problem Relation Age of Onset   Diabetes Mother    Cancer Father    Social History:  reports that he has never smoked. His smokeless tobacco use includes snuff. He reports current alcohol use. He reports that he does not use drugs.  Allergies:  Allergies  Allergen Reactions   Apraclonidine     Redness    Brimonidine     redness   Netarsudil     redness    Methazolamide Dermatitis    Current meds: ALPRAZolam levothyroxine   Results for orders placed or performed during the hospital encounter of 07/03/23 (from the past 48 hour(s))  Surgical pcr screen     Status: None   Collection Time: 07/03/23  9:26 AM   Specimen: Nasal Mucosa; Nasal Swab  Result Value Ref Range   MRSA, PCR NEGATIVE NEGATIVE   Staphylococcus aureus NEGATIVE NEGATIVE    Comment: (NOTE) The Xpert SA Assay (FDA approved for NASAL specimens in patients 83 years of age and older), is one component of a comprehensive surveillance program. It is not intended to diagnose infection nor to guide or monitor treatment. Performed at [facility], [address], [place], [place] [address]   Basic metabolic panel per protocol     Status: Abnormal   Collection Time: 07/03/23  9:45 AM  Result Value Ref Range   Sodium 133 (L) 135 - 145 mmol/L   Potassium 3.3 (L) 3.5 - 5.1 mmol/L   Chloride 101 98 - 111 mmol/L   CO2 22 22 - 32 mmol/L   Glucose, Bld 136 (H) 70 - 99 mg/dL    Comment: Glucose reference range applies only to samples taken after fasting for at least 8 hours.   BUN 10 8 - 23 mg/dL   Creatinine, Ser 6.04 (H) 0.61 - 1.24 mg/dL  Calcium 8.2 (L) 8.9 - 10.3 mg/dL   GFR, Estimated 56 (L) >60 mL/min    Comment: (NOTE) Calculated using the CKD-EPI Creatinine Equation (2021)    Anion gap 10 5 - 15    Comment: Performed at [facility], [address] [place], [place] [address]  CBC per protocol     Status: None   Collection Time: 07/03/23  9:45 AM  Result Value Ref Range   WBC 8.9 4.0 - 10.5 K/uL   RBC 5.51 4.22 - 5.81 MIL/uL   Hemoglobin 16.7 13.0 - 17.0 g/dL   HCT 44.0 34.7 - 42.5 %   MCV 90.0 80.0 - 100.0 fL   MCH 30.3 26.0 - 34.0 pg   MCHC 33.7 30.0 - 36.0 g/dL   RDW 95.6 38.7 - 56.4 %   Platelets 187 150 - 400 K/uL   nRBC 0.0 0.0 - 0.2 %    Comment: Performed at [facility], [address] [place], [place] [address]   No results  found.  Review of Systems  Constitutional: Negative.   HENT: Negative.    Eyes: Negative.   Respiratory: Negative.    Cardiovascular: Negative.   Gastrointestinal: Negative.   Endocrine: Negative.   Genitourinary: Negative.   Musculoskeletal:  Positive for back pain and gait problem.  Skin: Negative.   Neurological:  Positive for weakness and numbness.  Psychiatric/Behavioral: Negative.      There were no vitals taken for this visit. Physical Exam Constitutional:      Appearance: Normal appearance.  HENT:     Head: Normocephalic and atraumatic.     Right Ear: External ear normal.     Left Ear: External ear normal.     Nose: Nose normal.     Mouth/Throat:     Pharynx: Oropharynx is clear.  Eyes:     Conjunctiva/sclera: Conjunctivae normal.  Cardiovascular:     Rate and Rhythm: Normal rate.     Pulses: Normal pulses.  Pulmonary:     Effort: Pulmonary effort is normal.  Abdominal:     General: Abdomen is flat.  Musculoskeletal:     Cervical back: Normal range of motion.     Comments: Patient is awake, alert, and oriented 3. Well-nourished and well-developed. Pleasant. Ambulates with forward flexion in the lumbar spine, antalgic  On examination of the lumbar spine, tender to palpation through the spinous processes and paraspinous musculature. Nontender bilateral buttocks and over the greater trochanter of the hips. Decreased flexion and extension lumbar spine. Positive seated straight leg raise bilaterally with buttock and leg pain. No lower extremity weakness noted, 5/5 throughout the hip flexor, quads, hamstrings, plantar and dorsiflexion, and EHL. No groin pain with rotation of the hips bilaterally. Patellar and Achilles reflexes 2+. No clonus present, negative Babinski. Sensation intact distally. No calf pain or sign of DVT.  Skin:    General: Skin is warm and dry.  Neurological:     Mental Status: He is alert.    Prior imaging again reviewed. Three-view x-rays  demonstrate multilevel disc degeneration.. End-stage at L2-3 L3-4 and L4-5. There is mild calcification of the aorta. No instability in flexion extension. A dextrorotatory scoliosis MRI demonstrates severe multifactorial spinal stenosis at L4-5 and moderately severe at L3-4.  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 Symptoms interfering with quality of life and activities of daily living at this point  Plan: We again discussed relevant  anatomy and etiology of his symptoms as well as treatment options. Given that he has failed conservative treatment as listed above, Dr. Shelle Iron previously discussed with him possible need for surgical decompression. We discussed decompression L4-5, possibly L3-4 with the patient and his son. We discussed the procedure itself as well as risks, complications and alternatives including but not limited to DVT, PE, failure of procedure, need for secondary procedure, anesthesia risk, even death. Questions were invited and answered. We will obtain clearance from PCP and then proceed accordingly with scheduling microlumbar decompression.  Plan microlumbar decompression L4-5, possible L3-4  Dorothy Spark, PA-C for Dr Shelle Iron 07/04/2023, 12:36 PM

## 2023-07-11 MED ORDER — CHLORHEXIDINE GLUCONATE 0.12 % MT SOLN
OROMUCOSAL | Status: AC
  Filled 2023-07-11: qty 15

## 2023-07-11 NOTE — Anesthesia Preprocedure Evaluation (Addendum)
Anesthesia Evaluation  Patient identified by MRN, date of birth, ID band Patient awake    Reviewed: Allergy & Precautions, H&P , NPO status , Patient's Chart, lab work & pertinent test results  Airway Mallampati: II  TM Distance: >3 FB Neck ROM: Full    Dental no notable dental hx. (+) Teeth Intact, Poor Dentition, Dental Advisory Given   Pulmonary neg pulmonary ROS, Patient abstained from smoking.   Pulmonary exam normal breath sounds clear to auscultation       Cardiovascular Exercise Tolerance: Good negative cardio ROS Normal cardiovascular exam Rhythm:Regular Rate:Normal  ECHO 16 Left ventricle:  The cavity size was normal. Systolic function was  normal. The estimated ejection fraction was in the range of 55% to  60%. Wall motion was normal; there were no regional wall motion  abnormalities. The transmitral flow pattern was normal. The  deceleration time of the early transmitral flow velocity was  normal. The pulmonary vein flow pattern was normal. The tissue  Doppler parameters were normal. Doppler parameters are consistent  with abnormal left ventricular relaxation (grade 1 diastolic  dysfunction)     Neuro/Psych  Neuromuscular disease negative neurological ROS  negative psych ROS   GI/Hepatic negative GI ROS, Neg liver ROS,GERD  ,,  Endo/Other  negative endocrine ROSHypothyroidism    Renal/GU negative Renal ROS  negative genitourinary   Musculoskeletal negative musculoskeletal ROS (+)    Abdominal   Peds negative pediatric ROS (+)  Hematology negative hematology ROS (+)   Anesthesia Other Findings   Reproductive/Obstetrics negative OB ROS                             Anesthesia Physical Anesthesia Plan  ASA: 3  Anesthesia Plan: General   Post-op Pain Management: Tylenol PO (pre-op)*, Celebrex PO (pre-op)* and Ketamine IV*   Induction: Intravenous  PONV Risk Score and  Plan: 2 and Ondansetron, Dexamethasone and Treatment may vary due to age or medical condition  Airway Management Planned: Oral ETT  Additional Equipment: None  Intra-op Plan:   Post-operative Plan: Extubation in OR  Informed Consent: I have reviewed the patients History and Physical, chart, labs and discussed the procedure including the risks, benefits and alternatives for the proposed anesthesia with the patient or authorized representative who has indicated his/her understanding and acceptance.       Plan Discussed with: Anesthesiologist and CRNA  Anesthesia Plan Comments: (  )        Anesthesia Quick Evaluation

## 2023-07-12 ENCOUNTER — Inpatient Hospital Stay (HOSPITAL_BASED_OUTPATIENT_CLINIC_OR_DEPARTMENT_OTHER): Payer: Self-pay | Admitting: Anesthesiology

## 2023-07-12 ENCOUNTER — Inpatient Hospital Stay (HOSPITAL_COMMUNITY): Payer: Medicare Other

## 2023-07-12 ENCOUNTER — Encounter (HOSPITAL_COMMUNITY): Payer: Self-pay | Admitting: Specialist

## 2023-07-12 ENCOUNTER — Inpatient Hospital Stay (HOSPITAL_COMMUNITY): Payer: Medicare Other | Admitting: Anesthesiology

## 2023-07-12 ENCOUNTER — Other Ambulatory Visit: Payer: Self-pay

## 2023-07-12 ENCOUNTER — Ambulatory Visit (HOSPITAL_COMMUNITY)
Admission: RE | Admit: 2023-07-12 | Discharge: 2023-07-12 | Disposition: A | Discharge status: Home / Self Care | Payer: Medicare Other | Source: Ambulatory Visit | Attending: Specialist | Admitting: Specialist

## 2023-07-12 ENCOUNTER — Encounter (HOSPITAL_COMMUNITY)
Admission: RE | Disposition: A | Discharge status: Home / Self Care | Payer: Self-pay | Source: Ambulatory Visit | Attending: Specialist

## 2023-07-12 DIAGNOSIS — M48061 Spinal stenosis, lumbar region without neurogenic claudication: Principal | ICD-10-CM | POA: Diagnosis present

## 2023-07-12 DIAGNOSIS — Z0189 Encounter for other specified special examinations: Secondary | ICD-10-CM | POA: Diagnosis not present

## 2023-07-12 DIAGNOSIS — M47819 Spondylosis without myelopathy or radiculopathy, site unspecified: Secondary | ICD-10-CM | POA: Diagnosis not present

## 2023-07-12 DIAGNOSIS — M4807 Spinal stenosis, lumbosacral region: Secondary | ICD-10-CM | POA: Diagnosis not present

## 2023-07-12 DIAGNOSIS — M4690 Unspecified inflammatory spondylopathy, site unspecified: Secondary | ICD-10-CM | POA: Diagnosis not present

## 2023-07-12 DIAGNOSIS — M48062 Spinal stenosis, lumbar region with neurogenic claudication: Secondary | ICD-10-CM | POA: Diagnosis not present

## 2023-07-12 DIAGNOSIS — E039 Hypothyroidism, unspecified: Secondary | ICD-10-CM | POA: Diagnosis not present

## 2023-07-12 DIAGNOSIS — Z01818 Encounter for other preprocedural examination: Secondary | ICD-10-CM | POA: Diagnosis not present

## 2023-07-12 DIAGNOSIS — M419 Scoliosis, unspecified: Secondary | ICD-10-CM | POA: Diagnosis not present

## 2023-07-12 DIAGNOSIS — M5136 Other intervertebral disc degeneration, lumbar region: Secondary | ICD-10-CM | POA: Diagnosis not present

## 2023-07-12 HISTORY — DX: Unspecified macular degeneration: H35.30

## 2023-07-12 HISTORY — PX: HEMI-MICRODISCECTOMY LUMBAR LAMINECTOMY LEVEL 1: SHX5846

## 2023-07-12 SURGERY — HEMI-MICRODISCECTOMY LUMBAR LAMINECTOMY LEVEL 1
Anesthesia: General | Laterality: Bilateral

## 2023-07-12 MED ORDER — OXYCODONE HCL 5 MG PO TABS
5.0000 mg | ORAL_TABLET | ORAL | 0 refills | Status: AC | PRN
Start: 2023-07-12 — End: ?

## 2023-07-12 MED ORDER — ACETAMINOPHEN 650 MG RE SUPP: 650.0000 mg | RECTAL | Status: DC | PRN

## 2023-07-12 MED ORDER — BISACODYL 5 MG PO TBEC: 5.0000 mg | DELAYED_RELEASE_TABLET | Freq: Every day | ORAL | Status: DC | PRN

## 2023-07-12 MED ORDER — MIDAZOLAM HCL 2 MG/2ML IJ SOLN
INTRAMUSCULAR | Status: DC | PRN
  Administered 2023-07-12: 1 mg via INTRAVENOUS

## 2023-07-12 MED ORDER — ACETAMINOPHEN 10 MG/ML IV SOLN
1000.0000 mg | INTRAVENOUS | Status: AC
  Administered 2023-07-12: 1000 mg via INTRAVENOUS
  Filled 2023-07-12: qty 100

## 2023-07-12 MED ORDER — MAGNESIUM CITRATE PO SOLN: 1.0000 | Freq: Once | ORAL | Status: DC | PRN

## 2023-07-12 MED ORDER — PROPOFOL 10 MG/ML IV BOLUS
INTRAVENOUS | Status: AC
  Filled 2023-07-12: qty 20

## 2023-07-12 MED ORDER — ALPRAZOLAM 0.5 MG PO TABS: 0.5000 mg | ORAL_TABLET | Freq: Three times a day (TID) | ORAL | Status: DC | PRN

## 2023-07-12 MED ORDER — TRANEXAMIC ACID-NACL 1000-0.7 MG/100ML-% IV SOLN
1000.0000 mg | INTRAVENOUS | Status: AC
  Administered 2023-07-12: 1000 mg via INTRAVENOUS
  Filled 2023-07-12: qty 100

## 2023-07-12 MED ORDER — POLYETHYLENE GLYCOL 3350 17 G PO PACK: 17.0000 g | PACK | Freq: Every day | ORAL | 0 refills | Status: DC

## 2023-07-12 MED ORDER — POLYETHYLENE GLYCOL 3350 17 G PO PACK: 17.0000 g | PACK | Freq: Every day | ORAL | Status: DC | PRN

## 2023-07-12 MED ORDER — KETAMINE HCL 50 MG/5ML IJ SOSY
PREFILLED_SYRINGE | INTRAMUSCULAR | Status: AC
  Filled 2023-07-12: qty 5

## 2023-07-12 MED ORDER — DOCUSATE SODIUM 100 MG PO CAPS
100.0000 mg | ORAL_CAPSULE | Freq: Two times a day (BID) | ORAL | Status: DC
  Administered 2023-07-12: 100 mg via ORAL
  Filled 2023-07-12: qty 1

## 2023-07-12 MED ORDER — DEXMEDETOMIDINE HCL IN NACL 80 MCG/20ML IV SOLN
INTRAVENOUS | Status: DC | PRN
Start: 2023-07-12 — End: 2023-07-12
  Administered 2023-07-12 (×4): 4 ug via INTRAVENOUS

## 2023-07-12 MED ORDER — 0.9 % SODIUM CHLORIDE (POUR BTL) OPTIME
TOPICAL | Status: DC | PRN
  Administered 2023-07-12: 1000 mL

## 2023-07-12 MED ORDER — PANTOPRAZOLE SODIUM 40 MG PO TBEC: 40.0000 mg | DELAYED_RELEASE_TABLET | Freq: Every day | ORAL | Status: DC

## 2023-07-12 MED ORDER — LATANOPROST 0.005 % OP SOLN
1.0000 [drp] | Freq: Every day | OPHTHALMIC | Status: DC
  Filled 2023-07-12: qty 2.5

## 2023-07-12 MED ORDER — OXYCODONE HCL 5 MG PO TABS: 5.0000 mg | ORAL_TABLET | ORAL | Status: DC | PRN

## 2023-07-12 MED ORDER — LIDOCAINE 2% (20 MG/ML) 5 ML SYRINGE
INTRAMUSCULAR | Status: AC
  Filled 2023-07-12: qty 5

## 2023-07-12 MED ORDER — CEFAZOLIN SODIUM-DEXTROSE 2-4 GM/100ML-% IV SOLN
2.0000 g | Freq: Three times a day (TID) | INTRAVENOUS | Status: DC
  Administered 2023-07-12: 2 g via INTRAVENOUS
  Filled 2023-07-12: qty 100

## 2023-07-12 MED ORDER — OXYCODONE HCL 5 MG PO TABS
5.0000 mg | ORAL_TABLET | Freq: Once | ORAL | Status: AC | PRN
  Administered 2023-07-12: 5 mg via ORAL

## 2023-07-12 MED ORDER — OXYCODONE HCL 5 MG/5ML PO SOLN: 5.0000 mg | Freq: Once | ORAL | Status: AC | PRN

## 2023-07-12 MED ORDER — MEPERIDINE HCL 25 MG/ML IJ SOLN: 6.2500 mg | INTRAMUSCULAR | Status: DC | PRN

## 2023-07-12 MED ORDER — LACTATED RINGERS IV SOLN: INTRAVENOUS | Status: DC

## 2023-07-12 MED ORDER — ROCURONIUM BROMIDE 10 MG/ML (PF) SYRINGE
PREFILLED_SYRINGE | INTRAVENOUS | Status: AC
  Filled 2023-07-12: qty 10

## 2023-07-12 MED ORDER — THROMBIN 20000 UNITS EX SOLR
CUTANEOUS | Status: AC
  Filled 2023-07-12: qty 20000

## 2023-07-12 MED ORDER — BUPIVACAINE-EPINEPHRINE (PF) 0.5% -1:200000 IJ SOLN
INTRAMUSCULAR | Status: AC
  Filled 2023-07-12: qty 30

## 2023-07-12 MED ORDER — TRIAMCINOLONE ACETONIDE 0.5 % EX CREA
TOPICAL_CREAM | Freq: Two times a day (BID) | CUTANEOUS | Status: DC
  Filled 2023-07-12: qty 15

## 2023-07-12 MED ORDER — MIDAZOLAM HCL 2 MG/2ML IJ SOLN
INTRAMUSCULAR | Status: AC
  Filled 2023-07-12: qty 2

## 2023-07-12 MED ORDER — THROMBIN 20000 UNITS EX SOLR
CUTANEOUS | Status: DC | PRN
  Administered 2023-07-12: 20 mL via TOPICAL

## 2023-07-12 MED ORDER — PHENOL 1.4 % MT LIQD: 1.0000 | OROMUCOSAL | Status: DC | PRN

## 2023-07-12 MED ORDER — LIDOCAINE 2% (20 MG/ML) 5 ML SYRINGE
INTRAMUSCULAR | Status: DC | PRN
  Administered 2023-07-12: 100 mg via INTRAVENOUS

## 2023-07-12 MED ORDER — MENTHOL 3 MG MT LOZG: 1.0000 | LOZENGE | OROMUCOSAL | Status: DC | PRN

## 2023-07-12 MED ORDER — FENTANYL CITRATE (PF) 100 MCG/2ML IJ SOLN: 25.0000 ug | INTRAMUSCULAR | Status: DC | PRN

## 2023-07-12 MED ORDER — PHENYLEPHRINE HCL-NACL 20-0.9 MG/250ML-% IV SOLN
INTRAVENOUS | Status: DC | PRN
  Administered 2023-07-12: 25 ug/min via INTRAVENOUS

## 2023-07-12 MED ORDER — ORAL CARE MOUTH RINSE: 15.0000 mL | Freq: Once | OROMUCOSAL | Status: AC

## 2023-07-12 MED ORDER — ONDANSETRON HCL 4 MG/2ML IJ SOLN: 4.0000 mg | Freq: Once | INTRAMUSCULAR | Status: DC | PRN

## 2023-07-12 MED ORDER — OXYCODONE HCL 5 MG PO TABS
ORAL_TABLET | ORAL | Status: AC
  Filled 2023-07-12: qty 1

## 2023-07-12 MED ORDER — ONDANSETRON HCL 4 MG/2ML IJ SOLN: 4.0000 mg | Freq: Four times a day (QID) | INTRAMUSCULAR | Status: DC | PRN

## 2023-07-12 MED ORDER — CEFAZOLIN SODIUM-DEXTROSE 2-4 GM/100ML-% IV SOLN
2.0000 g | INTRAVENOUS | Status: AC
  Administered 2023-07-12: 2 g via INTRAVENOUS
  Filled 2023-07-12: qty 100

## 2023-07-12 MED ORDER — ROCURONIUM BROMIDE 10 MG/ML (PF) SYRINGE
PREFILLED_SYRINGE | INTRAVENOUS | Status: DC | PRN
  Administered 2023-07-12 (×2): 10 mg via INTRAVENOUS
  Administered 2023-07-12: 20 mg via INTRAVENOUS
  Administered 2023-07-12: 70 mg via INTRAVENOUS

## 2023-07-12 MED ORDER — RISAQUAD PO CAPS
1.0000 | ORAL_CAPSULE | Freq: Every day | ORAL | Status: DC
  Administered 2023-07-12: 1 via ORAL
  Filled 2023-07-12: qty 1

## 2023-07-12 MED ORDER — DEXAMETHASONE SODIUM PHOSPHATE 10 MG/ML IJ SOLN
INTRAMUSCULAR | Status: AC
  Filled 2023-07-12: qty 1

## 2023-07-12 MED ORDER — PHENYLEPHRINE HCL-NACL 20-0.9 MG/250ML-% IV SOLN
INTRAVENOUS | Status: AC
  Filled 2023-07-12: qty 250

## 2023-07-12 MED ORDER — METHOCARBAMOL 500 MG PO TABS: 500.0000 mg | ORAL_TABLET | Freq: Four times a day (QID) | ORAL | Status: DC | PRN

## 2023-07-12 MED ORDER — BUPIVACAINE-EPINEPHRINE 0.5% -1:200000 IJ SOLN
INTRAMUSCULAR | Status: DC | PRN
  Administered 2023-07-12: 6 mL

## 2023-07-12 MED ORDER — PROPOFOL 10 MG/ML IV BOLUS
INTRAVENOUS | Status: DC | PRN
  Administered 2023-07-12: 120 mg via INTRAVENOUS

## 2023-07-12 MED ORDER — ACETAMINOPHEN 325 MG PO TABS: 325.0000 mg | ORAL_TABLET | ORAL | Status: DC | PRN

## 2023-07-12 MED ORDER — KETAMINE HCL 10 MG/ML IJ SOLN
INTRAMUSCULAR | Status: DC | PRN
Start: 2023-07-12 — End: 2023-07-12
  Administered 2023-07-12: 20 mg via INTRAVENOUS

## 2023-07-12 MED ORDER — METHOCARBAMOL 1000 MG/10ML IJ SOLN: 500.0000 mg | Freq: Four times a day (QID) | INTRAVENOUS | Status: DC | PRN

## 2023-07-12 MED ORDER — OXYCODONE HCL 5 MG PO TABS
10.0000 mg | ORAL_TABLET | ORAL | Status: DC | PRN
  Administered 2023-07-12: 10 mg via ORAL
  Filled 2023-07-12: qty 2

## 2023-07-12 MED ORDER — DOCUSATE SODIUM 100 MG PO CAPS
100.0000 mg | ORAL_CAPSULE | Freq: Two times a day (BID) | ORAL | 2 refills | Status: AC
Start: 2023-07-12 — End: 2024-07-11

## 2023-07-12 MED ORDER — DEXMEDETOMIDINE HCL IN NACL 80 MCG/20ML IV SOLN
INTRAVENOUS | Status: AC
  Filled 2023-07-12: qty 20

## 2023-07-12 MED ORDER — DORZOLAMIDE HCL-TIMOLOL MAL 2-0.5 % OP SOLN
1.0000 [drp] | Freq: Two times a day (BID) | OPHTHALMIC | Status: DC
  Filled 2023-07-12: qty 10

## 2023-07-12 MED ORDER — SUGAMMADEX SODIUM 200 MG/2ML IV SOLN
INTRAVENOUS | Status: DC | PRN
  Administered 2023-07-12: 200 mg via INTRAVENOUS

## 2023-07-12 MED ORDER — ACETAMINOPHEN 325 MG PO TABS: 650.0000 mg | ORAL_TABLET | ORAL | Status: DC | PRN

## 2023-07-12 MED ORDER — ONDANSETRON HCL 4 MG/2ML IJ SOLN
INTRAMUSCULAR | Status: AC
  Filled 2023-07-12: qty 2

## 2023-07-12 MED ORDER — HYDROMORPHONE HCL 1 MG/ML IJ SOLN
0.5000 mg | INTRAMUSCULAR | Status: DC | PRN
  Administered 2023-07-12: 0.5 mg via INTRAVENOUS
  Filled 2023-07-12: qty 0.5

## 2023-07-12 MED ORDER — ACETAMINOPHEN 160 MG/5ML PO SOLN: 325.0000 mg | ORAL | Status: DC | PRN

## 2023-07-12 MED ORDER — KCL IN DEXTROSE-NACL 20-5-0.9 MEQ/L-%-% IV SOLN
INTRAVENOUS | Status: DC
  Filled 2023-07-12: qty 1000

## 2023-07-12 MED ORDER — DEXAMETHASONE SODIUM PHOSPHATE 10 MG/ML IJ SOLN
INTRAMUSCULAR | Status: DC | PRN
  Administered 2023-07-12: 10 mg via INTRAVENOUS

## 2023-07-12 MED ORDER — ONDANSETRON HCL 4 MG PO TABS: 4.0000 mg | ORAL_TABLET | Freq: Four times a day (QID) | ORAL | Status: DC | PRN

## 2023-07-12 MED ORDER — ONDANSETRON HCL 4 MG/2ML IJ SOLN
INTRAMUSCULAR | Status: DC | PRN
  Administered 2023-07-12: 4 mg via INTRAVENOUS

## 2023-07-12 MED ORDER — ALUM & MAG HYDROXIDE-SIMETH 200-200-20 MG/5ML PO SUSP: 30.0000 mL | Freq: Four times a day (QID) | ORAL | Status: DC | PRN

## 2023-07-12 MED ORDER — CHLORHEXIDINE GLUCONATE 0.12 % MT SOLN
15.0000 mL | Freq: Once | OROMUCOSAL | Status: AC
  Administered 2023-07-12: 15 mL via OROMUCOSAL
  Filled 2023-07-12: qty 15

## 2023-07-12 SURGICAL SUPPLY — 61 items
BAG COUNTER SPONGE SURGICOUNT (BAG) ×1 IMPLANT
BAG DECANTER FOR FLEXI CONT (MISCELLANEOUS) IMPLANT
BAG SPNG CNTER NS LX DISP (BAG) ×1
BAND INSRT 18 STRL LF DISP RB (MISCELLANEOUS) ×2
BAND RUBBER #18 3X1/16 STRL (MISCELLANEOUS) ×2 IMPLANT
BUR EGG ELITE 5.0 (BURR) IMPLANT
BUR RND DIAMOND ELITE 4.0 (BURR) IMPLANT
CLEANER TIP ELECTROSURG 2X2 (MISCELLANEOUS) ×1 IMPLANT
CNTNR URN SCR LID CUP LEK RST (MISCELLANEOUS) ×1 IMPLANT
CONT SPEC 4OZ STRL OR WHT (MISCELLANEOUS) ×1
DRAPE LAPAROTOMY 100X72X124 (DRAPES) ×1 IMPLANT
DRAPE MICROSCOPE SLANT 54X150 (MISCELLANEOUS) ×1 IMPLANT
DRAPE SHEET LG 3/4 BI-LAMINATE (DRAPES) ×1 IMPLANT
DRAPE SURG 17X11 SM STRL (DRAPES) ×1 IMPLANT
DRAPE UTILITY XL STRL (DRAPES) ×1 IMPLANT
DRSG AQUACEL AG ADV 3.5X 4 (GAUZE/BANDAGES/DRESSINGS) IMPLANT
DRSG AQUACEL AG ADV 3.5X 6 (GAUZE/BANDAGES/DRESSINGS) IMPLANT
DRSG TELFA 3X8 NADH STRL (GAUZE/BANDAGES/DRESSINGS) IMPLANT
DURAPREP 26ML APPLICATOR (WOUND CARE) ×1 IMPLANT
DURASEAL SPINE SEALANT 3ML (MISCELLANEOUS) IMPLANT
ELECT BLADE 4.0 EZ CLEAN MEGAD (MISCELLANEOUS)
ELECT REM PT RETURN 9FT ADLT (ELECTROSURGICAL) ×1
ELECTRODE BLDE 4.0 EZ CLN MEGD (MISCELLANEOUS) IMPLANT
ELECTRODE REM PT RTRN 9FT ADLT (ELECTROSURGICAL) ×1 IMPLANT
GLOVE BIOGEL PI IND STRL 7.0 (GLOVE) IMPLANT
GLOVE BIOGEL PI IND STRL 7.5 (GLOVE) ×1 IMPLANT
GLOVE SURG SS PI 7.0 STRL IVOR (GLOVE) ×1 IMPLANT
GLOVE SURG SS PI 8.0 STRL IVOR (GLOVE) ×2 IMPLANT
GOWN STRL REUS W/ TWL LRG LVL3 (GOWN DISPOSABLE) ×1 IMPLANT
GOWN STRL REUS W/ TWL XL LVL3 (GOWN DISPOSABLE) ×1 IMPLANT
GOWN STRL REUS W/TWL LRG LVL3 (GOWN DISPOSABLE) ×1
GOWN STRL REUS W/TWL XL LVL3 (GOWN DISPOSABLE) ×2
IV CATH 14GX2 1/4 (CATHETERS) ×1 IMPLANT
KIT BASIN OR (CUSTOM PROCEDURE TRAY) ×1 IMPLANT
NDL 22X1.5 STRL (OR ONLY) (MISCELLANEOUS) ×1 IMPLANT
NDL SPNL 18GX3.5 QUINCKE PK (NEEDLE) ×2 IMPLANT
NEEDLE 22X1.5 STRL (OR ONLY) (MISCELLANEOUS) ×1 IMPLANT
NEEDLE SPNL 18GX3.5 QUINCKE PK (NEEDLE) ×2 IMPLANT
PACK LAMINECTOMY NEURO (CUSTOM PROCEDURE TRAY) ×1 IMPLANT
PATTIES SURGICAL .25X.25 (GAUZE/BANDAGES/DRESSINGS) IMPLANT
PATTIES SURGICAL .5 X.5 (GAUZE/BANDAGES/DRESSINGS) IMPLANT
PATTIES SURGICAL .75X.75 (GAUZE/BANDAGES/DRESSINGS) ×1 IMPLANT
SOLUTION PRONTOSAN WOUND 350ML (IRRIGATION / IRRIGATOR) IMPLANT
SPONGE SURGIFOAM ABS GEL 100 (HEMOSTASIS) ×1 IMPLANT
SPONGE T-LAP 4X18 ~~LOC~~+RFID (SPONGE) IMPLANT
STAPLER VISISTAT (STAPLE) IMPLANT
STRIP CLOSURE SKIN 1/2X4 (GAUZE/BANDAGES/DRESSINGS) ×1 IMPLANT
SUT NURALON 4 0 TR CR/8 (SUTURE) IMPLANT
SUT PROLENE 3 0 PS 2 (SUTURE) IMPLANT
SUT VIC AB 1 CT1 27 (SUTURE) ×2
SUT VIC AB 1 CT1 27XBRD ANTBC (SUTURE) IMPLANT
SUT VIC AB 1-0 CT2 27 (SUTURE) IMPLANT
SUT VIC AB 2-0 CT1 27 (SUTURE) ×1
SUT VIC AB 2-0 CT1 TAPERPNT 27 (SUTURE) IMPLANT
SUT VIC AB 2-0 CT2 27 (SUTURE) IMPLANT
SYR 3ML LL SCALE MARK (SYRINGE) ×1 IMPLANT
TOWEL GREEN STERILE (TOWEL DISPOSABLE) ×1 IMPLANT
TOWEL GREEN STERILE FF (TOWEL DISPOSABLE) ×1 IMPLANT
TRAY FOLEY MTR SLVR 16FR STAT (SET/KITS/TRAYS/PACK) ×1 IMPLANT
WIPE CHG 2% 2PK PREOPERATIVE (MISCELLANEOUS) ×1 IMPLANT
YANKAUER SUCT BULB TIP NO VENT (SUCTIONS) ×1 IMPLANT

## 2023-07-12 NOTE — Op Note (Deleted)
  The note originally documented on this encounter has been moved the the encounter in which it belongs.  

## 2023-07-12 NOTE — Brief Op Note (Signed)
07/12/2023  7:14 AM  PATIENT:  Dion Body  77 y.o. male  PRE-OPERATIVE DIAGNOSIS:  Spinal stenosis of lumbar region  POST-OPERATIVE DIAGNOSIS:  * No post-op diagnosis entered *  PROCEDURE:  Procedure(s): MICROLUMBAR DECOMPRESSION L4-L5, POSSIBLE L3-L4 (Bilateral)  SURGEON:  Surgeons and Role:    * Jene Every, MD - Primary  PHYSICIAN ASSISTANT:   ASSISTANTS: Bissell   ANESTHESIA:   general  EBL:  75   BLOOD ADMINISTERED:none  DRAINS: none   LOCAL MEDICATIONS USED:  MARCAINE     SPECIMEN:  No Specimen  DISPOSITION OF SPECIMEN:  N/A  COUNTS:  YES  TOURNIQUET:  * No tourniquets in log *  DICTATION: .Other Dictation: Dictation Number [id number]  PLAN OF CARE: Admit for overnight observation  PATIENT DISPOSITION:  PACU - hemodynamically stable.   Delay start of Pharmacological VTE agent (>24hrs) due to surgical blood loss or risk of bleeding: yes

## 2023-07-12 NOTE — Discharge Instructions (Signed)
Walk As Tolerated utilizing back precautions.  No bending, twisting, or lifting.  No driving for 2 weeks.   °Aquacel dressing may remain in place until follow up. May shower with aquacel dressing in place. If the dressing peels off or becomes saturated, you may remove aquacel dressing and place gauze and tape dressing which should be kept clean and dry and changed daily. Do not remove steri-strips if they are present. °See Dr. Beane in office in 10 to 14 days. Begin taking aspirin 81mg per day starting 4 days after your surgery if not allergic to aspirin or on another blood thinner. °Walk daily even outside. Use a cane or walker only if necessary. °Avoid sitting on soft sofas. ° °

## 2023-07-12 NOTE — Op Note (Unsigned)
NAME: Gilbert Rangel, ROOTES MEDICAL RECORD NO: [id number] ACCOUNT NO: [id number] DATE OF BIRTH: 09/30/1946 FACILITY: [facility] LOCATION: [facility] PHYSICIAN: Javier Docker, MD  Operative Report   DATE OF PROCEDURE: 07/12/2023  PREOPERATIVE DIAGNOSES:  Spinal stenosis, L4-L5 and L5-S1.  POSTOPERATIVE DIAGNOSES:  Spinal stenosis, L4-L5 and L5-S1.  PROCEDURES PERFORMED:  1.  Central decompression bilateral hemilaminotomies and foraminotomies, L4-L5. 2.  Central decompression bilateral hemilaminotomies and foraminotomies L3-L4 bilaterally.  ANESTHESIA:  General.  ASSISTANT:  Andrez Grime, PA.  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  Predominantly left lower extremity symptoms.  Also, right lower extremity symptoms.  Severe stenosis noted on his MRI at both those levels.  He was  indicated for decompression.  Risks and benefits discussed including bleeding, infection, damage to neurovascular structures, no change in symptoms, worsening symptoms, DVT, PE, anesthetic complications, etc.  DESCRIPTION OF PROCEDURE:  With the patient in supine position, after induction of adequate general anesthesia, 2 grams Kefzol placed prone on the Wilson frame.  All bony prominences were well padded.  Foley to gravity.  Lumbar region was prepped and  draped in the usual sterile fashion.  Two 18-gauge spinal needles were utilized to localize L3-L4, L4-L5 interspace, confirmed with x-ray.  This was overread by radiologist.  Incision was made from above the spinous processes of 3 to below 5.   Subcutaneous tissue was dissected.  Electrocautery was utilized to achieve hemostasis.  Dorsal lumbar fascia divided in line with skin incision.  Paraspinous muscle elevated from lamina L3-L4, L4-L5.  McCulloch retractor was placed.  Operating microscope  was draped and brought in the surgical field.  Confirmatory radiograph obtained with Kochers on the spinous processes  of 3 and between 4 and 5 and 5 and confirmed by x-ray overread.  Leksell rongeur was utilized to remove the spinous process 4, partial  of 5 and partial of 3.  Hemilaminotomies began with a Leksell rongeur in the inferior aspect of 4 bilaterally.  Hypertrophic facets were noted.  I then used a 3 mm Kerrison.  We performed the hemilaminotomies cephalad to the point detaching the  ligamentum flavum, centrally and laterally preserving the ample pars.  In he medial aspect, the inferior aspect of the facet at and 4 and 5.  Next, we used a Woodson retractor to protect the thecal sac and the epidural space between that and the  hypertrophic ligamentum.  I began sequentially removing ligamentum flavum from the interspace first centrally and then bilaterally.  Removing ligamentum flavum with neuro patties to protect the neural elements at all times.  We continued caudad to detach  the ligamentum flavum from the cephalad edge of 5 bilaterally.  We used straight curette and performed hemilaminotomies of the cephalad edge of 5 bilaterally.  Neuro patties were placed beneath the lamina.  I then decompressed the lateral recess to  medial border of the pedicle, first on the right and then on the left, protecting the neural elements at all times.  There was severe stenosis noted bilaterally, particularly in the lateral recesses. Foraminotomies L5 were performed and L4.  Bipolar  cautery was utilized to achieve hemostasis.  There was stenosis, severe noted on the left as well.  Following the decompression, the [facility] probe passed freely out the foramen of 4 and 5.  There was good restoration of the thecal sac.  No evidence of  CSF leakage.  Bone wax was placed on the cancellous surfaces.  The thecal sac was  covered.  Turned attention to 3-4.  Hemilaminotomy of the caudad edge of 3 were performed.  And at 4-5, we removed portion of the medial aspect of the facet, less than 30%  bilaterally.  We continued cephalad to the  point detaching the ligamentum flavum, there was hypertrophic ligamentum flavum causing severe central stenosis.  Ligamentum flavum was detached from the cephalad edge of 4 with a straight curette and the neural  elements.  I protected the thecal sac.  I then sequentially removed the ligamentum flavum from the interspace first centrally.  Protecting the thecal sac with a Woodson.  Interspace of the hypertrophic ligamentum flavum bilaterally.  I continued caudad,  protecting the neural elements and removed a portion of the cephalad edge of L4 with a 2 and 3 mm Kerrison.  And smoothening the edges with a curette at both levels.  Following this, a Woodson probe was passed freely out the foramen of 3 and 4.  We had  a good restoration of the thecal sac.  I decompressed the lateral recess and the medial border of the pedicle bilaterally.  Woodson probe was passed freely out the foramen of 3 and 4 and above the pedicle of 3.  There was an ample arch at L4.  Next, we  copiously irrigated it.  Bone wax was placed on the cancellous surfaces.  Achieved strict hemostasis with bipolar cautery.  We then placed thrombin-soaked Gelfoam in the laminotomy defects and then removed them.  Two small pieces remained, 2 mm2, both at  3-4 and 4-5.  I obtained confirmatory radiographs with a Woodson at the foramen of 3 and 5.  Then, I removed the McCulloch retractor, irrigated the paraspinous musculature.  We intermittently released traction on the McCulloch retractor throughout the  case when x-rays were taken.  Irrigated the paraspinous musculature and achieved strict hemostasis with bipolar cautery.  I then closed the dorsal lumbar fascia with #1 Vicryl in interrupted figure of eight sutures.  A small aperture approximately 1 cm  distally to allow for any drainage if it were to occur.  Irrigated the subcutaneous, approximate the subcu with 2-0 and skin with staples.  Wound was dressed sterilely.  The patient was placed supine on  the hospital bed, extubated without difficulty and  transported to the recovery room in satisfactory condition.  The patient tolerated the procedure well.  No complications.  ASSISTANT:  Andrez Grime, PA, was used throughout the case for patient positioning, general intermittent neural retraction, suction and closure and positioning.  BLOOD LOSS:  75 mL.   MUK D: 07/12/2023 10:33:55 am T: 07/12/2023 10:48:00 am  JOB: [id number]/ [id number]

## 2023-07-12 NOTE — Plan of Care (Signed)
Pt doing well. Pt and son given D/C instructions with verbal understanding. Rx's were sent to the pharmacy by MD. Pt's incision is clean and dry with no sign of infection. Pt's IV was removed prior to D/C. Pt D/C'd home via wheelchair per MD order. Pt is stable @ D/C and has no other needs at this time. Ashley Allred, RN 

## 2023-07-12 NOTE — Anesthesia Postprocedure Evaluation (Signed)
Anesthesia Post Note  Patient: Gilbert Rangel  Procedure(s) Performed: Girtha Rm DECOMPRESSION LUMBAR FOUR-LUMBAR FIVE, POSSIBLE LUMBAR THREE-LUMBAR FOUR (Bilateral)     Patient location during evaluation: PACU Anesthesia Type: General Level of consciousness: awake and alert Pain management: pain level controlled Vital Signs Assessment: post-procedure vital signs reviewed and stable Respiratory status: spontaneous breathing, nonlabored ventilation, respiratory function stable and patient connected to nasal cannula oxygen Cardiovascular status: blood pressure returned to baseline and stable Postop Assessment: no apparent nausea or vomiting Anesthetic complications: no   No notable events documented.  Last Vitals:  Vitals:   07/12/23 1130 07/12/23 1147  BP: 132/77 131/89  Pulse: 62 65  Resp: 18 18  Temp:  36.8 C  SpO2: 94% 98%    Last Pain:  Vitals:   07/12/23 1235  TempSrc:   PainSc: 7                  Kaydence Baba

## 2023-07-12 NOTE — Interval H&P Note (Signed)
History and Physical Interval Note:  07/12/2023 7:13 AM  Gilbert Rangel  has presented today for surgery, with the diagnosis of Spinal stenosis of lumbar region.  The various methods of treatment have been discussed with the patient and family. After consideration of risks, benefits and other options for treatment, the patient has consented to  Procedure(s): MICROLUMBAR DECOMPRESSION L4-L5, POSSIBLE L3-L4 (Bilateral) as a surgical intervention.  The patient's history has been reviewed, patient examined, no change in status, stable for surgery.  I have reviewed the patient's chart and labs.  Questions were answered to the patient's satisfaction.     Javier Docker

## 2023-07-12 NOTE — Transfer of Care (Signed)
Immediate Anesthesia Transfer of Care Note  Patient: Gilbert Rangel  Procedure(s) Performed: Girtha Rm DECOMPRESSION LUMBAR FOUR-LUMBAR FIVE, POSSIBLE LUMBAR THREE-LUMBAR FOUR (Bilateral)  Patient Location: PACU  Anesthesia Type:General  Level of Consciousness: sedated  Airway & Oxygen Therapy: Patient Spontanous Breathing and Patient connected to face mask  Post-op Assessment: Report given to RN  Post vital signs: Reviewed and stable  Last Vitals:  Vitals Value Taken Time  BP 118/72 07/12/23 1045  Temp    Pulse 67 07/12/23 1047  Resp 24 07/12/23 1047  SpO2 99 % 07/12/23 1047  Vitals shown include unfiled device data.  Last Pain:  Vitals:   07/12/23 0600  TempSrc:   PainSc: 0-No pain         Complications: No notable events documented.

## 2023-07-12 NOTE — Anesthesia Procedure Notes (Signed)
Procedure Name: Intubation Date/Time: 07/12/2023 7:42 AM  Performed by: Susy Manor, CRNAPre-anesthesia Checklist: Patient identified, Emergency Drugs available, Suction available and Patient being monitored Patient Re-evaluated:Patient Re-evaluated prior to induction Oxygen Delivery Method: Circle system utilized Preoxygenation: Pre-oxygenation with 100% oxygen Induction Type: IV induction Ventilation: Mask ventilation without difficulty Laryngoscope Size: Mac and 3 Grade View: Grade III Tube type: Oral Tube size: 7.5 mm Number of attempts: 1 Airway Equipment and Method: Stylet and Oral airway Placement Confirmation: ETT inserted through vocal cords under direct vision, positive ETCO2 and breath sounds checked- equal and bilateral Tube secured with: Tape Dental Injury: Teeth and Oropharynx as per pre-operative assessment

## 2023-07-12 NOTE — Evaluation (Signed)
Occupational Therapy Evaluation Patient Details Name: Gilbert Rangel MRN: [id number] DOB: 05-02-1946 Today's Date: 07/12/2023   History of Present Illness 77 yo M s/p MICROLUMBAR DECOMPRESSION L4-L5. PMHx significant for thyroid disease, Hx meniscal repair, previous back surgery, rotator cuff injury, and repair of biceps tendon.   Clinical Impression   Patient admitted for the diagnosis above.  PTA he lives with his son, and needed assist for medications, community mobility due to low vision.  Patient is a little unsteady, mainly due to visual impairments, but should do fine in his own environment.  Patient has all the needed DME at home, and his son will be there to assist as needed.  All questions answered, and no further OT needs exist in the acute setting.  Recommend follow up as prescribed by MD.         If plan is discharge home, recommend the following: Assist for transportation;Assistance with cooking/housework    Functional Status Assessment  Patient has not had a recent decline in their functional status  Equipment Recommendations  None recommended by OT    Recommendations for Other Services       Precautions / Restrictions Precautions Precautions: Fall Restrictions Weight Bearing Restrictions: No      Mobility Bed Mobility Overal bed mobility: Modified Independent                  Transfers Overall transfer level: Modified independent                        Balance Overall balance assessment: Mild deficits observed, not formally tested                                         ADL either performed or assessed with clinical judgement   ADL Overall ADL's : At baseline                                             Vision Ability to See in Adequate Light: 2 Moderately impaired Patient Visual Report: No change from baseline       Perception Perception: Within Functional Limits       Praxis Praxis: WFL        Pertinent Vitals/Pain Pain Assessment Pain Assessment: No/denies pain     Extremity/Trunk Assessment Upper Extremity Assessment Upper Extremity Assessment: Overall WFL for tasks assessed   Lower Extremity Assessment Lower Extremity Assessment: Overall WFL for tasks assessed   Cervical / Trunk Assessment Cervical / Trunk Assessment: Back Surgery   Communication Communication Communication: No apparent difficulties   Cognition Arousal: Alert Behavior During Therapy: WFL for tasks assessed/performed Overall Cognitive Status: History of cognitive impairments - at baseline                                 General Comments: Decreased STM     General Comments       Exercises     Shoulder Instructions      Home Living Family/patient expects to be discharged to:: Private residence Living Arrangements: Children Available Help at Discharge: Family;Available 24 hours/day Type of Home: House Home Access: Stairs to enter [employer] of Steps: 3 Entrance Stairs-Rails: Right Home  Layout: One level     Bathroom Shower/Tub: Producer, television/film/video: Handicapped height Bathroom Accessibility: Yes How Accessible: Accessible via walker Home Equipment: Shower seat;Grab bars - tub/shower          Prior Functioning/Environment Prior Level of Function : Independent/Modified Independent             Mobility Comments: Mod I ADLs Comments: Family assists with community mobility and meds due to low vision        OT Problem List: Impaired balance (sitting and/or standing);Decreased safety awareness      OT Treatment/Interventions:      OT Goals(Current goals can be found in the care plan section) Acute Rehab OT Goals Patient Stated Goal: Return home OT Goal Formulation: With patient Time For Goal Achievement: 07/16/23 Potential to Achieve Goals: Good  OT Frequency:      Co-evaluation              AM-PAC OT "6 Clicks" Daily  Activity     Outcome Measure Help from another person eating meals?: None Help from another person taking care of personal grooming?: None Help from another person toileting, which includes using toliet, bedpan, or urinal?: None Help from another person bathing (including washing, rinsing, drying)?: A Little Help from another person to put on and taking off regular upper body clothing?: None Help from another person to put on and taking off regular lower body clothing?: A Little 6 Click Score: 22   End of Session Equipment Utilized During Treatment: Gait belt Nurse Communication: Mobility status  Activity Tolerance: Patient tolerated treatment well Patient left: in bed;with call bell/phone within reach  OT Visit Diagnosis: Unsteadiness on feet (R26.81)                Time: 2956-2130 OT Time Calculation (min): 21 min Charges:  OT General Charges $OT Visit: 1 Visit OT Evaluation $OT Eval Moderate Complexity: 1 Mod  07/12/2023  RP, OTR/L  Acute Rehabilitation Services  Office:  [phone]   Suzanna Obey 07/12/2023, 3:09 PM

## 2023-07-13 ENCOUNTER — Encounter (HOSPITAL_COMMUNITY): Payer: Self-pay | Admitting: Specialist

## 2023-07-14 ENCOUNTER — Encounter (HOSPITAL_BASED_OUTPATIENT_CLINIC_OR_DEPARTMENT_OTHER): Payer: Self-pay | Admitting: Emergency Medicine

## 2023-07-14 ENCOUNTER — Emergency Department (HOSPITAL_BASED_OUTPATIENT_CLINIC_OR_DEPARTMENT_OTHER)
Admission: EM | Admit: 2023-07-14 | Discharge: 2023-07-14 | Disposition: A | Discharge status: Home / Self Care | Payer: Medicare Other | Attending: Emergency Medicine | Admitting: Emergency Medicine

## 2023-07-14 ENCOUNTER — Other Ambulatory Visit: Payer: Self-pay

## 2023-07-14 DIAGNOSIS — F17290 Nicotine dependence, other tobacco product, uncomplicated: Secondary | ICD-10-CM | POA: Insufficient documentation

## 2023-07-14 DIAGNOSIS — E039 Hypothyroidism, unspecified: Secondary | ICD-10-CM | POA: Insufficient documentation

## 2023-07-14 DIAGNOSIS — R066 Hiccough: Secondary | ICD-10-CM | POA: Insufficient documentation

## 2023-07-14 MED ORDER — ALUM & MAG HYDROXIDE-SIMETH 200-200-20 MG/5ML PO SUSP
15.0000 mL | Freq: Once | ORAL | Status: AC
  Administered 2023-07-14: 15 mL via ORAL
  Filled 2023-07-14: qty 30

## 2023-07-14 MED ORDER — CHLORPROMAZINE HCL 25 MG PO TABS
25.0000 mg | ORAL_TABLET | Freq: Once | ORAL | Status: AC
  Administered 2023-07-14: 25 mg via ORAL
  Filled 2023-07-14: qty 1

## 2023-07-14 MED ORDER — CHLORPROMAZINE HCL 10 MG PO TABS
10.0000 mg | ORAL_TABLET | Freq: Two times a day (BID) | ORAL | 0 refills | Status: DC | PRN
Start: 2023-07-14 — End: 2023-09-16

## 2023-07-14 NOTE — ED Notes (Signed)
Pt awoke and ambulated to restroom. Hiccups are gone for now.

## 2023-07-14 NOTE — ED Notes (Signed)
Patient is resting comfortably. 

## 2023-07-14 NOTE — Discharge Instructions (Signed)
It was a pleasure caring for you today in the emergency department.  Please return to the emergency department for any worsening or worrisome symptoms.  Please continue taking antacid medication.  Please follow-up with your surgeon for routine postop follow-up

## 2023-07-14 NOTE — ED Triage Notes (Addendum)
Pt had spinal surgery yesterday- hx of GERD. Came home yesterday. Persistent hiccups starting today. Son noticed it in his sleep. States he had post op but were not that big of an issue and did not think they would persist.  No pain reported. Has taken 5 mg oxy and 0.5mg  xanax total today for post op pain management. PT is alert and oriented and ambulated to room.

## 2023-07-14 NOTE — ED Provider Notes (Signed)
[facility] Provider Note  CSN: [id number] Arrival date & time: 07/14/23 0117  Chief Complaint(s) Hiccups  HPI Gilbert Rangel is a 77 y.o. male with past medical history as below, significant for GERD, SAH, hypothyroid, spinal stenosis, tobacco use who presents to the ED with complaint of hiccups.   Patient here with family, he had spinal surgery 9/5 with Dr. Jillyn Hidden.  Reports difficulty sleeping since then.  Has been having ongoing hiccups since the surgery.  No send chest pain or dyspnea.  No abdominal pain nausea or vomiting.  No fevers, no significant pain to the surgical site.  He is ambulatory.  Tolerating p.o. intake.  Reports hiccups preventing him from sleeping  Past Medical History Past Medical History:  Diagnosis Date   GERD (gastroesophageal reflux disease)    Hypothyroidism    Macular degeneration    Pneumonia    Thyroid disease    Patient Active Problem List   Diagnosis Date Noted   Spinal stenosis at L4-L5 level 07/12/2023   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   HNP (herniated nucleus pulposus), lumbar 09/29/2015   Spinal stenosis of lumbar region 09/29/2015   Low back pain radiating to right leg 08/30/2015   Acute encephalopathy 08/30/2015   Hypothyroidism 08/30/2015   URI (upper respiratory infection) 08/30/2015   Acute viral syndrome 08/30/2015   Transaminitis    Right-sided low back pain with right-sided sciatica    Tobacco abuse    EKG abnormality 08/28/2015   CAP (community acquired pneumonia) 08/28/2015   Sepsis due to pneumonia (HCC) 08/28/2015   Altered mental status 08/27/2015   Home Medication(s) Prior to Admission medications   Medication Sig Start Date End Date Taking? Authorizing Provider  chlorproMAZINE (THORAZINE) 10 MG tablet Take 1 tablet (10 mg total) by mouth 2 (two) times daily as needed for up to 8 doses for hiccoughs. 07/14/23  Yes Sloan Leiter, DO  ALPRAZolam Prudy Feeler) 0.5 MG tablet Take  0.5 mg by mouth 3 (three) times daily as needed for anxiety. 01/17/21   [provider]  ARTIFICIAL TEAR SOLUTION OP Place 1 drop into both eyes 3 (three) times daily.    [provider]  augmented betamethasone dipropionate (DIPROLENE-AF) 0.05 % cream Apply 1 Application topically daily as needed (rash). 04/14/23   [provider]  docusate sodium (COLACE) 100 MG capsule Take 1 capsule (100 mg total) by mouth 2 (two) times daily. 07/12/23 07/11/24  Jene Every, MD  dorzolamide-timolol (COSOPT) 2-0.5 % ophthalmic solution Place 1 drop into both eyes 2 (two) times daily.    [provider]  latanoprost (XALATAN) 0.005 % ophthalmic solution Place 1 drop into both eyes at bedtime. 12/03/20   [provider]  oxyCODONE (OXY IR/ROXICODONE) 5 MG immediate release tablet Take 1 tablet (5 mg total) by mouth every 4 (four) hours as needed for severe pain. 07/12/23   Jene Every, MD  pantoprazole (PROTONIX) 40 MG tablet Take 40 mg by mouth daily before supper.    [provider]  polyethylene glycol (MIRALAX / GLYCOLAX) 17 g packet Take 17 g by mouth daily. 07/12/23   Jene Every, MD  testosterone cypionate (DEPOTESTOSTERONE CYPIONATE) 200 MG/ML injection Inject 100 mg into the muscle every Tuesday. 01/19/21   [provider]  vardenafil (LEVITRA) 20 MG tablet Take 20 mg by mouth daily as needed. 04/03/23   [provider]  Past Surgical History Past Surgical History:  Procedure Laterality Date   CHOLECYSTECTOMY     ELBOW SURGERY Right    EYE SURGERY Bilateral    HEMI-MICRODISCECTOMY LUMBAR LAMINECTOMY LEVEL 1 Bilateral 07/12/2023   Procedure: MICROLUMBAR DECOMPRESSION LUMBAR FOUR-LUMBAR FIVE, POSSIBLE LUMBAR THREE-LUMBAR FOUR;  Surgeon: Jene Every, MD;  Location: [facility] OR;  Service: Orthopedics;  Laterality:  Bilateral;   HERNIA REPAIR     KNEE ARTHROSCOPY W/ MENISCAL REPAIR     LUMBAR LAMINECTOMY/DECOMPRESSION MICRODISCECTOMY N/A 09/29/2015   Procedure: MICRO LUMBER DECOMPRESSION L5-S1 AND L4-5 ON RIGHT;  Surgeon: Jene Every, MD;  Location: [facility] ORS;  Service: Orthopedics;  Laterality: N/A;   REPAIR / REINSERT BICEPS TENDON AT ELBOW     ROTATOR CUFF REPAIR     stomach     TONSILLECTOMY     Family History Family History  Problem Relation Age of Onset   Diabetes Mother    Cancer Father     Social History Social History   Tobacco Use   Smoking status: Never   Smokeless tobacco: Current    Types: Snuff  Vaping Use   Vaping status: Never Used  Substance Use Topics   Alcohol use: Yes    Alcohol/week: 0.0 standard drinks of alcohol    Comment: occasionally   Drug use: No   Allergies Apraclonidine, Brimonidine, Netarsudil, and Methazolamide  Review of Systems Review of Systems  Constitutional:  Negative for chills and fever.  HENT:  Negative for trouble swallowing.   Respiratory:  Negative for cough, chest tightness and shortness of breath.   Cardiovascular:  Negative for chest pain and palpitations.  Gastrointestinal:  Negative for diarrhea and vomiting.  Genitourinary:  Negative for difficulty urinating.  Skin:  Positive for wound.  Psychiatric/Behavioral:  Negative for agitation and confusion.   All other systems reviewed and are negative.   Physical Exam Vital Signs  I have reviewed the triage vital signs BP (!) 149/85 (BP Location: Right Arm)   Pulse 74   Temp 99.1 F (37.3 C) (Oral)   Resp 18   SpO2 92%  Physical Exam Vitals and nursing note reviewed.  Constitutional:      General: He is not in acute distress.    Appearance: He is well-developed.  HENT:     Head: Normocephalic and atraumatic.     Right Ear: External ear normal.     Left Ear: External ear normal.     Mouth/Throat:     Mouth: Mucous membranes are moist.  Eyes:     General: No scleral  icterus. Cardiovascular:     Rate and Rhythm: Normal rate and regular rhythm.     Pulses: Normal pulses.     Heart sounds: Normal heart sounds.  Pulmonary:     Effort: Pulmonary effort is normal. No respiratory distress.     Breath sounds: Normal breath sounds.  Abdominal:     General: Abdomen is flat.     Palpations: Abdomen is soft.     Tenderness: There is no abdominal tenderness.  Musculoskeletal:       Arms:     Cervical back: No rigidity.     Right lower leg: No edema.     Left lower leg: No edema.  Skin:    General: Skin is warm and dry.     Capillary Refill: Capillary refill takes less than 2 seconds.  Neurological:     Mental Status: He is alert.  Psychiatric:        Mood and  Affect: Mood normal.        Behavior: Behavior normal.     ED Results and Treatments Labs (all labs ordered are listed, but only abnormal results are displayed) Labs Reviewed - No data to display                                                                                                                        Radiology No results found.  Pertinent labs & imaging results that were available during my care of the patient were reviewed by me and considered in my medical decision making (see MDM for details).  Medications Ordered in ED Medications  chlorproMAZINE (THORAZINE) tablet 25 mg (25 mg Oral Given 07/14/23 0216)  alum & mag hydroxide-simeth (MAALOX/MYLANTA) 200-200-20 MG/5ML suspension 15 mL (15 mLs Oral Given 07/14/23 0217)                                                                                                                                     Procedures Procedures  (including critical care time)  Medical Decision Making / ED Course    Medical Decision Making:    NIAL DERRINGTON is a 77 y.o. male  with past medical history as below, significant for GERD, SAH, hypothyroid, spinal stenosis, tobacco use who presents to the ED with complaint of hiccups. . The complaint  involves an extensive differential diagnosis and also carries with it a high risk of complications and morbidity.  Serious etiology was considered.   Complete initial physical exam performed, notably the patient  was no acute distress, sitting upright, hiccuping throughout exam.    Reviewed and confirmed nursing documentation for past medical history, family history, social history.  Vital signs reviewed.    Clinical Course as of 07/14/23 0442  Sat Jul 14, 2023  0350 Improved  [SG]    Clinical Course User Index [SG] Sloan Leiter, DO     Patient here with hiccups following surgery.  Will give Thorazine and Maalox given his history of GERD.  Symptoms resolved.  Hiccups likely secondary to recent surgery (Dr Reine Just decompression).  His exam is stable.  HDS, no hypoxia.  Feeling better after Thorazine, was able to sleep.  Will discharge home with family, prescription for Thorazine, follow-up with surgeon at postop visit.  Strict return precautions emphasized  The patient improved significantly and was discharged in stable condition. Detailed discussions were had  with the patient regarding current findings, and need for close f/u with PCP or on call doctor. The patient has been instructed to return immediately if the symptoms worsen in any way for re-evaluation. Patient verbalized understanding and is in agreement with current care plan. All questions answered prior to discharge.                   Additional history obtained: -Additional history obtained from family -External records from outside source obtained and reviewed including: Chart review including previous notes, labs, imaging, consultation notes including  Recent admission, home medications, prior labs, prior imaging   Lab Tests: na  EKG   EKG Interpretation Date/Time:    Ventricular Rate:    PR Interval:    QRS Duration:    QT Interval:    QTC Calculation:   R Axis:      Text  Interpretation:           Imaging Studies ordered: na   Medicines ordered and prescription drug management: Meds ordered this encounter  Medications   chlorproMAZINE (THORAZINE) tablet 25 mg   alum & mag hydroxide-simeth (MAALOX/MYLANTA) 200-200-20 MG/5ML suspension 15 mL   chlorproMAZINE (THORAZINE) 10 MG tablet    Sig: Take 1 tablet (10 mg total) by mouth 2 (two) times daily as needed for up to 8 doses for hiccoughs.    Dispense:  8 tablet    Refill:  0    -I have reviewed the patients home medicines and have made adjustments as needed   Consultations Obtained: na   Cardiac Monitoring: Continuous pulse oximetry interpreted by myself, 97% on RA.    Social Determinants of Health:  Diagnosis or treatment significantly limited by social determinants of health: current smoker   Reevaluation: After the interventions noted above, I reevaluated the patient and found that they have improved  Co morbidities that complicate the patient evaluation  Past Medical History:  Diagnosis Date   GERD (gastroesophageal reflux disease)    Hypothyroidism    Macular degeneration    Pneumonia    Thyroid disease       Dispostion: Disposition decision including need for hospitalization was considered, and patient discharged from emergency department.    Final Clinical Impression(s) / ED Diagnoses Final diagnoses:  Hiccups        Sloan Leiter, DO 07/14/23 [phone]

## 2023-07-19 DIAGNOSIS — H35351 Cystoid macular degeneration, right eye: Secondary | ICD-10-CM | POA: Diagnosis not present

## 2023-07-19 DIAGNOSIS — H35373 Puckering of macula, bilateral: Secondary | ICD-10-CM | POA: Diagnosis not present

## 2023-07-19 DIAGNOSIS — H34812 Central retinal vein occlusion, left eye, with macular edema: Secondary | ICD-10-CM | POA: Diagnosis not present

## 2023-07-19 DIAGNOSIS — H35353 Cystoid macular degeneration, bilateral: Secondary | ICD-10-CM | POA: Diagnosis not present

## 2023-07-19 DIAGNOSIS — H353231 Exudative age-related macular degeneration, bilateral, with active choroidal neovascularization: Secondary | ICD-10-CM | POA: Diagnosis not present

## 2023-07-19 DIAGNOSIS — H3562 Retinal hemorrhage, left eye: Secondary | ICD-10-CM | POA: Diagnosis not present

## 2023-07-19 DIAGNOSIS — H3582 Retinal ischemia: Secondary | ICD-10-CM | POA: Diagnosis not present

## 2023-07-19 DIAGNOSIS — Z961 Presence of intraocular lens: Secondary | ICD-10-CM | POA: Diagnosis not present

## 2023-07-19 DIAGNOSIS — I10 Essential (primary) hypertension: Secondary | ICD-10-CM | POA: Diagnosis not present

## 2023-08-03 DIAGNOSIS — L820 Inflamed seborrheic keratosis: Secondary | ICD-10-CM | POA: Diagnosis not present

## 2023-08-03 DIAGNOSIS — L308 Other specified dermatitis: Secondary | ICD-10-CM | POA: Diagnosis not present

## 2023-08-03 DIAGNOSIS — B353 Tinea pedis: Secondary | ICD-10-CM | POA: Diagnosis not present

## 2023-08-20 DIAGNOSIS — H401122 Primary open-angle glaucoma, left eye, moderate stage: Secondary | ICD-10-CM | POA: Diagnosis not present

## 2023-08-20 DIAGNOSIS — H34812 Central retinal vein occlusion, left eye, with macular edema: Secondary | ICD-10-CM | POA: Diagnosis not present

## 2023-08-20 DIAGNOSIS — H401113 Primary open-angle glaucoma, right eye, severe stage: Secondary | ICD-10-CM | POA: Diagnosis not present

## 2023-08-30 DIAGNOSIS — H353231 Exudative age-related macular degeneration, bilateral, with active choroidal neovascularization: Secondary | ICD-10-CM | POA: Diagnosis not present

## 2023-09-11 DIAGNOSIS — K219 Gastro-esophageal reflux disease without esophagitis: Secondary | ICD-10-CM | POA: Diagnosis not present

## 2023-09-11 DIAGNOSIS — F419 Anxiety disorder, unspecified: Secondary | ICD-10-CM | POA: Diagnosis not present

## 2023-09-13 ENCOUNTER — Emergency Department (HOSPITAL_COMMUNITY): Payer: Medicare Other

## 2023-09-13 ENCOUNTER — Other Ambulatory Visit: Payer: Self-pay

## 2023-09-13 ENCOUNTER — Inpatient Hospital Stay (HOSPITAL_COMMUNITY)
Admission: EM | Admit: 2023-09-13 | Discharge: 2023-09-16 | DRG: 871 | Disposition: A | Discharge status: Home / Self Care | Payer: Medicare Other | Attending: Internal Medicine | Admitting: Internal Medicine

## 2023-09-13 DIAGNOSIS — Z1152 Encounter for screening for COVID-19: Secondary | ICD-10-CM

## 2023-09-13 DIAGNOSIS — A498 Other bacterial infections of unspecified site: Secondary | ICD-10-CM | POA: Diagnosis not present

## 2023-09-13 DIAGNOSIS — K4020 Bilateral inguinal hernia, without obstruction or gangrene, not specified as recurrent: Secondary | ICD-10-CM | POA: Diagnosis present

## 2023-09-13 DIAGNOSIS — N200 Calculus of kidney: Secondary | ICD-10-CM | POA: Diagnosis not present

## 2023-09-13 DIAGNOSIS — R7401 Elevation of levels of liver transaminase levels: Principal | ICD-10-CM | POA: Diagnosis present

## 2023-09-13 DIAGNOSIS — G9341 Metabolic encephalopathy: Secondary | ICD-10-CM | POA: Diagnosis not present

## 2023-09-13 DIAGNOSIS — K5750 Diverticulosis of both small and large intestine without perforation or abscess without bleeding: Secondary | ICD-10-CM | POA: Diagnosis not present

## 2023-09-13 DIAGNOSIS — K449 Diaphragmatic hernia without obstruction or gangrene: Secondary | ICD-10-CM | POA: Diagnosis present

## 2023-09-13 DIAGNOSIS — A4159 Other Gram-negative sepsis: Secondary | ICD-10-CM | POA: Diagnosis not present

## 2023-09-13 DIAGNOSIS — Z9049 Acquired absence of other specified parts of digestive tract: Secondary | ICD-10-CM

## 2023-09-13 DIAGNOSIS — Z833 Family history of diabetes mellitus: Secondary | ICD-10-CM | POA: Diagnosis not present

## 2023-09-13 DIAGNOSIS — A419 Sepsis, unspecified organism: Secondary | ICD-10-CM | POA: Diagnosis not present

## 2023-09-13 DIAGNOSIS — R0689 Other abnormalities of breathing: Secondary | ICD-10-CM | POA: Diagnosis not present

## 2023-09-13 DIAGNOSIS — R748 Abnormal levels of other serum enzymes: Secondary | ICD-10-CM | POA: Diagnosis not present

## 2023-09-13 DIAGNOSIS — R509 Fever, unspecified: Secondary | ICD-10-CM | POA: Diagnosis not present

## 2023-09-13 DIAGNOSIS — R4182 Altered mental status, unspecified: Secondary | ICD-10-CM | POA: Diagnosis not present

## 2023-09-13 DIAGNOSIS — K760 Fatty (change of) liver, not elsewhere classified: Secondary | ICD-10-CM | POA: Diagnosis not present

## 2023-09-13 DIAGNOSIS — R1011 Right upper quadrant pain: Secondary | ICD-10-CM | POA: Diagnosis not present

## 2023-09-13 DIAGNOSIS — R1084 Generalized abdominal pain: Secondary | ICD-10-CM

## 2023-09-13 DIAGNOSIS — N400 Enlarged prostate without lower urinary tract symptoms: Secondary | ICD-10-CM | POA: Diagnosis present

## 2023-09-13 DIAGNOSIS — E039 Hypothyroidism, unspecified: Secondary | ICD-10-CM | POA: Diagnosis present

## 2023-09-13 DIAGNOSIS — J029 Acute pharyngitis, unspecified: Secondary | ICD-10-CM | POA: Diagnosis present

## 2023-09-13 DIAGNOSIS — R6520 Severe sepsis without septic shock: Secondary | ICD-10-CM | POA: Diagnosis not present

## 2023-09-13 DIAGNOSIS — F17290 Nicotine dependence, other tobacco product, uncomplicated: Secondary | ICD-10-CM | POA: Diagnosis not present

## 2023-09-13 DIAGNOSIS — Z800 Family history of malignant neoplasm of digestive organs: Secondary | ICD-10-CM | POA: Diagnosis not present

## 2023-09-13 DIAGNOSIS — N1831 Chronic kidney disease, stage 3a: Secondary | ICD-10-CM | POA: Diagnosis not present

## 2023-09-13 DIAGNOSIS — R0682 Tachypnea, not elsewhere classified: Secondary | ICD-10-CM | POA: Diagnosis not present

## 2023-09-13 DIAGNOSIS — K5900 Constipation, unspecified: Secondary | ICD-10-CM | POA: Diagnosis present

## 2023-09-13 DIAGNOSIS — K8050 Calculus of bile duct without cholangitis or cholecystitis without obstruction: Secondary | ICD-10-CM | POA: Diagnosis present

## 2023-09-13 DIAGNOSIS — K8031 Calculus of bile duct with cholangitis, unspecified, with obstruction: Secondary | ICD-10-CM | POA: Diagnosis present

## 2023-09-13 DIAGNOSIS — K219 Gastro-esophageal reflux disease without esophagitis: Secondary | ICD-10-CM | POA: Diagnosis present

## 2023-09-13 DIAGNOSIS — R932 Abnormal findings on diagnostic imaging of liver and biliary tract: Secondary | ICD-10-CM | POA: Diagnosis not present

## 2023-09-13 DIAGNOSIS — F419 Anxiety disorder, unspecified: Secondary | ICD-10-CM | POA: Diagnosis not present

## 2023-09-13 DIAGNOSIS — I6782 Cerebral ischemia: Secondary | ICD-10-CM | POA: Diagnosis not present

## 2023-09-13 DIAGNOSIS — K838 Other specified diseases of biliary tract: Secondary | ICD-10-CM | POA: Diagnosis not present

## 2023-09-13 DIAGNOSIS — K5730 Diverticulosis of large intestine without perforation or abscess without bleeding: Secondary | ICD-10-CM | POA: Diagnosis not present

## 2023-09-13 DIAGNOSIS — Z79899 Other long term (current) drug therapy: Secondary | ICD-10-CM

## 2023-09-13 DIAGNOSIS — E876 Hypokalemia: Secondary | ICD-10-CM | POA: Diagnosis not present

## 2023-09-13 DIAGNOSIS — K8309 Other cholangitis: Secondary | ICD-10-CM | POA: Diagnosis not present

## 2023-09-13 DIAGNOSIS — R531 Weakness: Secondary | ICD-10-CM | POA: Diagnosis not present

## 2023-09-13 DIAGNOSIS — I700 Atherosclerosis of aorta: Secondary | ICD-10-CM | POA: Diagnosis not present

## 2023-09-13 DIAGNOSIS — N281 Cyst of kidney, acquired: Secondary | ICD-10-CM | POA: Diagnosis not present

## 2023-09-13 DIAGNOSIS — R000 Tachycardia, unspecified: Secondary | ICD-10-CM | POA: Diagnosis not present

## 2023-09-13 LAB — CBC WITH DIFFERENTIAL/PLATELET
Abs Immature Granulocytes: 0.06 10*3/uL (ref 0.00–0.07)
Basophils Absolute: 0 10*3/uL (ref 0.0–0.1)
Basophils Relative: 0 %
Eosinophils Absolute: 0 10*3/uL (ref 0.0–0.5)
Eosinophils Relative: 0 %
HCT: 51.6 % (ref 39.0–52.0)
Hemoglobin: 17.4 g/dL — ABNORMAL HIGH (ref 13.0–17.0)
Immature Granulocytes: 0 %
Lymphocytes Relative: 3 %
Lymphs Abs: 0.5 10*3/uL — ABNORMAL LOW (ref 0.7–4.0)
MCH: 29.3 pg (ref 26.0–34.0)
MCHC: 33.7 g/dL (ref 30.0–36.0)
MCV: 87 fL (ref 80.0–100.0)
Monocytes Absolute: 0.9 10*3/uL (ref 0.1–1.0)
Monocytes Relative: 6 %
Neutro Abs: 12.8 10*3/uL — ABNORMAL HIGH (ref 1.7–7.7)
Neutrophils Relative %: 91 %
Platelets: 197 10*3/uL (ref 150–400)
RBC: 5.93 MIL/uL — ABNORMAL HIGH (ref 4.22–5.81)
RDW: 13.2 % (ref 11.5–15.5)
WBC: 14.3 10*3/uL — ABNORMAL HIGH (ref 4.0–10.5)
nRBC: 0 % (ref 0.0–0.2)

## 2023-09-13 LAB — URINALYSIS, W/ REFLEX TO CULTURE (INFECTION SUSPECTED)
Bacteria, UA: NONE SEEN
Glucose, UA: NEGATIVE mg/dL
Hgb urine dipstick: NEGATIVE
Ketones, ur: NEGATIVE mg/dL
Leukocytes,Ua: NEGATIVE
Nitrite: NEGATIVE
Protein, ur: 100 mg/dL — AB
Specific Gravity, Urine: 1.027 (ref 1.005–1.030)
pH: 5 (ref 5.0–8.0)

## 2023-09-13 LAB — COMPREHENSIVE METABOLIC PANEL
ALT: 367 U/L — ABNORMAL HIGH (ref 0–44)
AST: 585 U/L — ABNORMAL HIGH (ref 15–41)
Albumin: 3.9 g/dL (ref 3.5–5.0)
Alkaline Phosphatase: 213 U/L — ABNORMAL HIGH (ref 38–126)
Anion gap: 15 (ref 5–15)
BUN: 16 mg/dL (ref 8–23)
CO2: 21 mmol/L — ABNORMAL LOW (ref 22–32)
Calcium: 9.1 mg/dL (ref 8.9–10.3)
Chloride: 101 mmol/L (ref 98–111)
Creatinine, Ser: 1.35 mg/dL — ABNORMAL HIGH (ref 0.61–1.24)
GFR, Estimated: 54 mL/min — ABNORMAL LOW (ref >60)
Glucose, Bld: 140 mg/dL — ABNORMAL HIGH (ref 70–99)
Potassium: 3.3 mmol/L — ABNORMAL LOW (ref 3.5–5.1)
Sodium: 137 mmol/L (ref 135–145)
Total Bilirubin: 4 mg/dL — ABNORMAL HIGH (ref <1.2)
Total Protein: 7.2 g/dL (ref 6.5–8.1)

## 2023-09-13 LAB — RESP PANEL BY RT-PCR (RSV, FLU A&B, COVID)  RVPGX2
Influenza A by PCR: NEGATIVE
Influenza B by PCR: NEGATIVE
Resp Syncytial Virus by PCR: NEGATIVE
SARS Coronavirus 2 by RT PCR: NEGATIVE

## 2023-09-13 LAB — LIPASE, BLOOD: Lipase: 40 U/L (ref 11–51)

## 2023-09-13 LAB — I-STAT CG4 LACTIC ACID, ED: Lactic Acid, Venous: 1.4 mmol/L (ref 0.5–1.9)

## 2023-09-13 MED ORDER — LACTATED RINGERS IV BOLUS (SEPSIS)
1000.0000 mL | Freq: Once | INTRAVENOUS | Status: AC
  Administered 2023-09-13: 1000 mL via INTRAVENOUS

## 2023-09-13 MED ORDER — LACTATED RINGERS IV SOLN: INTRAVENOUS | Status: DC

## 2023-09-13 MED ORDER — ONDANSETRON HCL 4 MG/2ML IJ SOLN: 4.0000 mg | Freq: Four times a day (QID) | INTRAMUSCULAR | Status: DC | PRN

## 2023-09-13 MED ORDER — IOHEXOL 300 MG/ML  SOLN
100.0000 mL | Freq: Once | INTRAMUSCULAR | Status: AC | PRN
  Administered 2023-09-13: 100 mL via INTRAVENOUS

## 2023-09-13 MED ORDER — METRONIDAZOLE 500 MG/100ML IV SOLN
500.0000 mg | Freq: Once | INTRAVENOUS | Status: AC
  Administered 2023-09-13: 500 mg via INTRAVENOUS
  Filled 2023-09-13: qty 100

## 2023-09-13 MED ORDER — HEPARIN SODIUM (PORCINE) 5000 UNIT/ML IJ SOLN
5000.0000 [IU] | Freq: Three times a day (TID) | INTRAMUSCULAR | Status: DC
  Administered 2023-09-14 – 2023-09-16 (×7): 5000 [IU] via SUBCUTANEOUS
  Filled 2023-09-13 (×7): qty 1

## 2023-09-13 MED ORDER — SODIUM CHLORIDE 0.9 % IV SOLN
2.0000 g | Freq: Once | INTRAVENOUS | Status: AC
  Administered 2023-09-13: 2 g via INTRAVENOUS
  Filled 2023-09-13: qty 12.5

## 2023-09-13 MED ORDER — PIPERACILLIN-TAZOBACTAM 3.375 G IVPB
3.3750 g | Freq: Three times a day (TID) | INTRAVENOUS | Status: DC
  Administered 2023-09-14: 3.375 g via INTRAVENOUS
  Filled 2023-09-13: qty 50

## 2023-09-13 MED ORDER — LACTATED RINGERS IV SOLN
INTRAVENOUS | Status: AC
Start: 2023-09-13 — End: 2023-09-14

## 2023-09-13 MED ORDER — SENNOSIDES-DOCUSATE SODIUM 8.6-50 MG PO TABS: 1.0000 | ORAL_TABLET | Freq: Every evening | ORAL | Status: DC | PRN

## 2023-09-13 MED ORDER — HYDROMORPHONE HCL 1 MG/ML IJ SOLN: 0.5000 mg | INTRAMUSCULAR | Status: DC | PRN

## 2023-09-13 MED ORDER — POTASSIUM CHLORIDE 20 MEQ PO PACK: 40.0000 meq | PACK | Freq: Once | ORAL | Status: DC

## 2023-09-13 MED ORDER — ONDANSETRON HCL 4 MG PO TABS: 4.0000 mg | ORAL_TABLET | Freq: Four times a day (QID) | ORAL | Status: DC | PRN

## 2023-09-13 MED ORDER — VANCOMYCIN HCL IN DEXTROSE 1-5 GM/200ML-% IV SOLN
1000.0000 mg | Freq: Once | INTRAVENOUS | Status: AC
  Administered 2023-09-13: 1000 mg via INTRAVENOUS
  Filled 2023-09-13: qty 200

## 2023-09-13 MED ORDER — ALPRAZOLAM 0.5 MG PO TABS: 0.5000 mg | ORAL_TABLET | Freq: Every evening | ORAL | Status: DC | PRN

## 2023-09-13 NOTE — Progress Notes (Signed)
A consult was received from an ED provider for vancomycin and cefepime per pharmacy dosing. The patient's profile has been reviewed for ht/wt/allergies/indication/available labs.  Ht/wt ordered. No weight in chart to be able to dose weight based loading dose for vancomycin.  A one time order has been placed for cefepime 2 g IV once + vancomycin 1000 mg IV once.    Further antibiotics/pharmacy consults should be ordered by admitting physician if indicated.                       Thank you, Cindi Carbon, PharmD 09/13/2023  5:17 PM

## 2023-09-13 NOTE — ED Notes (Signed)
ED TO INPATIENT HANDOFF REPORT  ED Nurse Name and Phone #:   Alan Ripper [phone]  S Name/Age/Gender Gilbert Rangel 77 y.o. male Room/Bed: WA18/WA18  Code Status   Code Status: Full Code  Home/SNF/Other Home Patient oriented to: self, place, time, and situation Is this baseline? Yes   Triage Complete: Triage complete  Chief Complaint Sepsis [facility]) [A41.9]  Triage Note Patient BIB [facility] due to Altered Mental Status (EMS called Code Sepsis). Per EMS, pt's son reported general weakness since last night as well as fever and confusion. Son gave Tylenol 1000 mg prior to calling EMS. Patient denies UTI symptoms. Last BM was 09/10/2023. 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    Allergies Allergies  Allergen Reactions   Apraclonidine     Redness    Brimonidine     redness   Netarsudil     redness   Methazolamide Dermatitis    Level of Care/Admitting Diagnosis ED Disposition     ED Disposition  Admit   Condition  --   Comment  Hospital [facility]: [facility] [[id number]]  Level of Care: Med-Surg [16]  May admit patient to [facility] or [facility] if equivalent level of care is available:: No  Covid Evaluation: Confirmed COVID Negative  Diagnosis: Sepsis [facility]) [[id number]]  Admitting Physician: Charlsie Quest [[id number]]  Attending Physician: Charlsie Quest [[id number]]  Certification:: I certify this patient will need inpatient services for at least 2 midnights  Expected Medical Readiness: 09/17/2023          B Medical/Surgery History Past Medical History:  Diagnosis Date   GERD (gastroesophageal reflux disease)    Hypothyroidism    Macular degeneration    Pneumonia    Thyroid disease    Past Surgical History:  Procedure Laterality Date   CHOLECYSTECTOMY     ELBOW SURGERY Right    EYE SURGERY Bilateral    HEMI-MICRODISCECTOMY LUMBAR LAMINECTOMY LEVEL 1 Bilateral 07/12/2023   Procedure: MICROLUMBAR DECOMPRESSION LUMBAR  FOUR-LUMBAR FIVE, POSSIBLE LUMBAR THREE-LUMBAR FOUR;  Surgeon: Jene Every, MD;  Location: [facility] OR;  Service: Orthopedics;  Laterality: Bilateral;   HERNIA REPAIR     KNEE ARTHROSCOPY W/ MENISCAL REPAIR     LUMBAR LAMINECTOMY/DECOMPRESSION MICRODISCECTOMY N/A 09/29/2015   Procedure: MICRO LUMBER DECOMPRESSION L5-S1 AND L4-5 ON RIGHT;  Surgeon: Jene Every, MD;  Location: [facility] ORS;  Service: Orthopedics;  Laterality: N/A;   REPAIR / REINSERT BICEPS TENDON AT ELBOW     ROTATOR CUFF REPAIR     stomach     TONSILLECTOMY       A IV Location/Drains/Wounds Patient Lines/Drains/Airways Status     Active Line/Drains/Airways     Name Placement date Placement time Site Days   Peripheral IV 09/13/23 18 G Anterior;Left;Proximal Forearm 09/13/23  1651  Forearm  less than 1   Peripheral IV 09/13/23 20 G Anterior;Left Hand 09/13/23  1741  Hand  less than 1   Peripheral IV 09/13/23 20 G Anterior;Right Hand 09/13/23  1741  Hand  less than 1            Intake/Output Last 24 hours  Intake/Output Summary (Last 24 hours) at 09/13/2023 2346 Last data filed at 09/13/2023 1852 Gross per 24 hour  Intake 400 ml  Output --  Net 400 ml    Labs/Imaging Results for orders placed or performed during the hospital encounter of 09/13/23 (from the past 48 hour(s))  Resp panel by RT-PCR (RSV, Flu A&B, Covid) Anterior  Nasal Swab     Status: None   Collection Time: 09/13/23  5:15 PM   Specimen: Anterior Nasal Swab  Result Value Ref Range   SARS Coronavirus 2 by RT PCR NEGATIVE NEGATIVE    Comment: (NOTE) SARS-CoV-2 target nucleic acids are NOT DETECTED.  The SARS-CoV-2 RNA is generally detectable in upper respiratory specimens during the acute phase of infection. The lowest concentration of SARS-CoV-2 viral copies this assay can detect is 138 copies/mL. A negative result does not preclude SARS-Cov-2 infection and should not be used as the sole basis for treatment or other patient management decisions. A  negative result may occur with  improper specimen collection/handling, submission of specimen other than nasopharyngeal swab, presence of viral mutation(s) within the areas targeted by this assay, and inadequate number of viral copies(<138 copies/mL). A negative result must be combined with clinical observations, patient history, and epidemiological information. The expected result is Negative.  Fact Sheet for Patients:  BloggerCourse.com  Fact Sheet for Healthcare Providers:  SeriousBroker.it  This test is no t yet approved or cleared by the [place] FDA and  has been authorized for detection and/or diagnosis of SARS-CoV-2 by FDA under an Emergency Use Authorization (EUA). This EUA will remain  in effect (meaning this test can be used) for the duration of the COVID-19 declaration under Section 564(b)(1) of the Act, 21 U.S.C.section 360bbb-3(b)(1), unless the authorization is terminated  or revoked sooner.       Influenza A by PCR NEGATIVE NEGATIVE   Influenza B by PCR NEGATIVE NEGATIVE    Comment: (NOTE) The Xpert Xpress SARS-CoV-2/FLU/RSV plus assay is intended as an aid in the diagnosis of influenza from Nasopharyngeal swab specimens and should not be used as a sole basis for treatment. Nasal washings and aspirates are unacceptable for Xpert Xpress SARS-CoV-2/FLU/RSV testing.  Fact Sheet for Patients: BloggerCourse.com  Fact Sheet for Healthcare Providers: SeriousBroker.it  This test is not yet approved or cleared by the [place] FDA and has been authorized for detection and/or diagnosis of SARS-CoV-2 by FDA under an Emergency Use Authorization (EUA). This EUA will remain in effect (meaning this test can be used) for the duration of the COVID-19 declaration under Section 564(b)(1) of the Act, 21 U.S.C. section 360bbb-3(b)(1), unless the authorization is terminated  or revoked.     Resp Syncytial Virus by PCR NEGATIVE NEGATIVE    Comment: (NOTE) Fact Sheet for Patients: BloggerCourse.com  Fact Sheet for Healthcare Providers: SeriousBroker.it  This test is not yet approved or cleared by the [place] FDA and has been authorized for detection and/or diagnosis of SARS-CoV-2 by FDA under an Emergency Use Authorization (EUA). This EUA will remain in effect (meaning this test can be used) for the duration of the COVID-19 declaration under Section 564(b)(1) of the Act, 21 U.S.C. section 360bbb-3(b)(1), unless the authorization is terminated or revoked.  Performed at [facility], [address] [place], [place] [address]   Comprehensive metabolic panel     Status: Abnormal   Collection Time: 09/13/23  5:29 PM  Result Value Ref Range   Sodium 137 135 - 145 mmol/L   Potassium 3.3 (L) 3.5 - 5.1 mmol/L   Chloride 101 98 - 111 mmol/L   CO2 21 (L) 22 - 32 mmol/L   Glucose, Bld 140 (H) 70 - 99 mg/dL    Comment: Glucose reference range applies only to samples taken after fasting for at least 8 hours.   BUN 16 8 - 23 mg/dL  Creatinine, Ser 1.35 (H) 0.61 - 1.24 mg/dL   Calcium 9.1 8.9 - 19.1 mg/dL   Total Protein 7.2 6.5 - 8.1 g/dL   Albumin 3.9 3.5 - 5.0 g/dL   AST 478 (H) 15 - 41 U/L   ALT 367 (H) 0 - 44 U/L   Alkaline Phosphatase 213 (H) 38 - 126 U/L   Total Bilirubin 4.0 (H) <1.2 mg/dL   GFR, Estimated 54 (L) >60 mL/min    Comment: (NOTE) Calculated using the CKD-EPI Creatinine Equation (2021)    Anion gap 15 5 - 15    Comment: Performed at [facility], [address], [place], [place] [address]  CBC with Differential     Status: Abnormal   Collection Time: 09/13/23  5:29 PM  Result Value Ref Range   WBC 14.3 (H) 4.0 - 10.5 K/uL   RBC 5.93 (H) 4.22 - 5.81 MIL/uL   Hemoglobin 17.4 (H) 13.0 - 17.0 g/dL   HCT 13.0 86.5 - 78.4 %   MCV 87.0 80.0 -  100.0 fL   MCH 29.3 26.0 - 34.0 pg   MCHC 33.7 30.0 - 36.0 g/dL   RDW 69.6 29.5 - 28.4 %   Platelets 197 150 - 400 K/uL   nRBC 0.0 0.0 - 0.2 %   Neutrophils Relative % 91 %   Neutro Abs 12.8 (H) 1.7 - 7.7 K/uL   Lymphocytes Relative 3 %   Lymphs Abs 0.5 (L) 0.7 - 4.0 K/uL   Monocytes Relative 6 %   Monocytes Absolute 0.9 0.1 - 1.0 K/uL   Eosinophils Relative 0 %   Eosinophils Absolute 0.0 0.0 - 0.5 K/uL   Basophils Relative 0 %   Basophils Absolute 0.0 0.0 - 0.1 K/uL   Immature Granulocytes 0 %   Abs Immature Granulocytes 0.06 0.00 - 0.07 K/uL    Comment: Performed at [facility], [address], [place], [place] [address]  Blood Culture (routine x 2)     Status: None (Preliminary result)   Collection Time: 09/13/23  5:29 PM   Specimen: BLOOD RIGHT HAND  Result Value Ref Range   Specimen Description      BLOOD RIGHT HAND Performed at [facility], [address], [place], [place] [address]    Special Requests      BOTTLES DRAWN AEROBIC AND ANAEROBIC Blood Culture adequate volume Performed at [facility], [address], [place], [place] [address]    Culture PENDING    Report Status PENDING   Lipase, blood     Status: None   Collection Time: 09/13/23  5:29 PM  Result Value Ref Range   Lipase 40 11 - 51 U/L    Comment: Performed at [facility], [address] [place], [place] [address]  I-Stat Lactic Acid, ED     Status: None   Collection Time: 09/13/23  5:37 PM  Result Value Ref Range   Lactic Acid, Venous 1.4 0.5 - 1.9 mmol/L  Blood Culture (routine x 2)     Status: None (Preliminary result)   Collection Time: 09/13/23  5:38 PM   Specimen: BLOOD LEFT HAND  Result Value Ref Range   Specimen Description      BLOOD LEFT HAND Performed at [facility], [address] [place], [place] [address]    Special Requests      BOTTLES DRAWN AEROBIC AND ANAEROBIC Blood Culture adequate volume Performed at  [facility],  [address] [place], [place] [address]    Culture PENDING    Report Status PENDING   Urinalysis, w/ Reflex to Culture (Infection Suspected) -Urine, Catheterized     Status: Abnormal   Collection Time: 09/13/23  5:38 PM  Result Value Ref Range   Specimen Source URINE, CATHETERIZED    Color, Urine AMBER (A) YELLOW    Comment: BIOCHEMICALS MAY BE AFFECTED BY COLOR   APPearance CLEAR CLEAR   Specific Gravity, Urine 1.027 1.005 - 1.030   pH 5.0 5.0 - 8.0   Glucose, UA NEGATIVE NEGATIVE mg/dL   Hgb urine dipstick NEGATIVE NEGATIVE   Bilirubin Urine SMALL (A) NEGATIVE   Ketones, ur NEGATIVE NEGATIVE mg/dL   Protein, ur 387 (A) NEGATIVE mg/dL   Nitrite NEGATIVE NEGATIVE   Leukocytes,Ua NEGATIVE NEGATIVE   RBC / HPF 0-5 0 - 5 RBC/hpf   WBC, UA 0-5 0 - 5 WBC/hpf    Comment:        Reflex urine culture not performed if WBC <=10, OR if Squamous epithelial cells >5. If Squamous epithelial cells >5 suggest recollection.    Bacteria, UA NONE SEEN NONE SEEN   Squamous Epithelial / HPF 0-5 0 - 5 /HPF   Mucus PRESENT     Comment: Performed at [facility], [address], [place], [place] [address]   US Abdomen Limited RUQ (LIVER/GB)  Result Date: 09/13/2023 CLINICAL DATA:  Right upper quadrant pain, prior cholecystectomy EXAM: ULTRASOUND ABDOMEN LIMITED RIGHT UPPER QUADRANT COMPARISON:  09/13/2023 FINDINGS: Gallbladder: Cholecystectomy Common bile duct: Diameter: 3 mm Liver: Increased liver echotexture consistent with hepatic steatosis. No focal liver abnormality. No intrahepatic biliary duct dilation. Portal vein is patent on color Doppler imaging with normal direction of blood flow towards the liver. Other: None. IMPRESSION: 1. Increased liver echotexture consistent with hepatic steatosis. 2. No evidence of biliary duct dilation. Electronically Signed   By: Sharlet Salina M.D.   On: 09/13/2023 22:40   CT ABDOMEN PELVIS W CONTRAST  Result  Date: 09/13/2023 CLINICAL DATA:  Acute, non localized abdominal pain. EXAM: CT ABDOMEN AND PELVIS WITH CONTRAST TECHNIQUE: Multidetector CT imaging of the abdomen and pelvis was performed using the standard protocol following bolus administration of intravenous contrast. RADIATION DOSE REDUCTION: This exam was performed according to the departmental dose-optimization program which includes automated exposure control, adjustment of the mA and/or kV according to patient size and/or use of iterative reconstruction technique. CONTRAST:  OMNIPAQUE IOHEXOL 300 MG/ML  SOLN COMPARISON:  Abdomen and pelvis CT report dated 08/12/2012. FINDINGS: Lower chest: Unremarkable. Hepatobiliary: Mild diffuse low-density of the liver. Cholecystectomy clips. Pancreas: Unremarkable. No pancreatic ductal dilatation or surrounding inflammatory changes. Spleen: Normal in size without focal abnormality. Adrenals/Urinary Tract: 1.1 cm relatively low-density right adrenal nodule. Normal-appearing left adrenal gland. 2 mm upper pole left renal calculus and 2 mm mid right renal calculus. No hydronephrosis. Unremarkable ureters and urinary bladder. Stomach/Bowel: Multiple colonic diverticula without evidence of diverticulitis. Unremarkable appendix, small bowel and stomach. Vascular/Lymphatic: No significant vascular findings are present. No enlarged abdominal or pelvic lymph nodes. Reproductive: Mildly enlarged prostate gland. Other: Small bilateral proximal inguinal hernias containing fat. 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 IMPRESSION: 1. No acute abnormality. 2. Mild hepatic steatosis. 3. 1.1 cm right adrenal probable adenoma. 4. Bilateral nonobstructing renal calculi. 5. Colonic diverticulosis. 6. Mildly enlarged prostate gland. 7. Small bilateral proximal inguinal hernias containing fat. Electronically Signed   By: Zada Finders.D.  On: 09/13/2023 20:58   CT Head Wo  Contrast  Result Date: 09/13/2023 CLINICAL DATA:  Altered mental status EXAM: CT HEAD WITHOUT CONTRAST TECHNIQUE: Contiguous axial images were obtained from the base of the skull through the vertex without intravenous contrast. RADIATION DOSE REDUCTION: This exam was performed according to the departmental dose-optimization program which includes automated exposure control, adjustment of the mA and/or kV according to patient size and/or use of iterative reconstruction technique. COMPARISON:  03/19/2021 FINDINGS: Brain: No mass, hemorrhage or extra-axial collection. There is hypoattenuation of the white matter. CSF spaces are normal. Vascular: No hyperdense vessel or unexpected calcification. Skull: Normal Sinuses/Orbits: Paranasal sinuses are clear. No mastoid effusion. Normal orbits. Ocular lens replacements and right ocular valve. Other: None IMPRESSION: 1. No acute intracranial abnormality. 2. Findings of chronic small vessel ischemia. Electronically Signed   By: Deatra Robinson M.D.   On: 09/13/2023 20:28   DG Chest Port 1 View  Result Date: 09/13/2023 CLINICAL DATA:  Sepsis EXAM: PORTABLE CHEST 1 VIEW COMPARISON:  Chest x-ray 09/17/2015 FINDINGS: The heart size and mediastinal contours are within normal limits. Underinflation. No consolidation, pneumothorax or effusion. No edema. The visualized skeletal structures are unremarkable. Overlapping cardiac leads. Film is under penetrated. Degenerative changes of the spine IMPRESSION: Underinflation.  No acute cardiopulmonary disease. Electronically Signed   By: Karen Kays M.D.   On: 09/13/2023 19:43    Pending Labs Unresulted Labs (From admission, onward)     Start     Ordered   09/14/23 0500  Protime-INR  Tomorrow morning,   R        09/13/23 2303   09/14/23 0500  Procalcitonin  Tomorrow morning,   R       References:    Procalcitonin Lower Respiratory Tract Infection AND Sepsis Procalcitonin Algorithm   09/13/23 2303   09/14/23 0500  Comprehensive  metabolic panel  Tomorrow morning,   R        09/13/23 2303   09/14/23 0500  CBC  Tomorrow morning,   R        09/13/23 2303   09/13/23 2300  Acetaminophen level  Once,   R        09/13/23 2259   09/13/23 2259  Hepatitis panel, acute  Once,   R        09/13/23 2259            Vitals/Pain Today's Vitals   09/13/23 2217 09/13/23 2230 09/13/23 2300 09/13/23 2330  BP:  129/81 128/76 127/80  Pulse:  79 74 71  Resp:  18 (!) 31 19  Temp: 98 F (36.7 C)     TempSrc: Oral     SpO2:  95% 96% 95%  Weight:      Height:      PainSc:        Isolation Precautions No active isolations  Medications Medications  lactated ringers infusion (has no administration in time range)  potassium chloride (KLOR-CON) packet 40 mEq (has no administration in time range)  heparin injection 5,000 Units (has no administration in time range)  ondansetron (ZOFRAN) tablet 4 mg (has no administration in time range)    Or  ondansetron (ZOFRAN) injection 4 mg (has no administration in time range)  senna-docusate (Senokot-S) tablet 1 tablet (has no administration in time range)  HYDROmorphone (DILAUDID) injection 0.5 mg (has no administration in time range)  ALPRAZolam (XANAX) tablet 0.5 mg (has no administration in time range)  piperacillin-tazobactam (ZOSYN) IVPB 3.375 g (has no  administration in time range)  ceFEPIme (MAXIPIME) 2 g in sodium chloride 0.9 % 100 mL IVPB (0 g Intravenous Stopped 09/13/23 1816)  metroNIDAZOLE (FLAGYL) IVPB 500 mg (0 mg Intravenous Stopped 09/13/23 1844)  vancomycin (VANCOCIN) IVPB 1000 mg/200 mL premix (0 mg Intravenous Stopped 09/13/23 1852)  lactated ringers bolus 1,000 mL (0 mLs Intravenous Stopped 09/13/23 2022)    And  lactated ringers bolus 1,000 mL (0 mLs Intravenous Stopped 09/13/23 2022)    And  lactated ringers bolus 1,000 mL (0 mLs Intravenous Stopped 09/13/23 2113)  iohexol (OMNIPAQUE) 300 MG/ML solution 100 mL (100 mLs Intravenous Contrast Given 09/13/23 1849)   iohexol (OMNIPAQUE) 300 MG/ML solution 100 mL (100 mLs Intravenous Contrast Given 09/13/23 1916)    Mobility walks     Focused Assessments     R Recommendations: See Admitting Provider Note  Report given to:   Additional Notes:

## 2023-09-13 NOTE — ED Notes (Signed)
Patient transported to CT 

## 2023-09-13 NOTE — Progress Notes (Signed)
[facility] is following code sepsis 

## 2023-09-13 NOTE — H&P (Signed)
History and Physical    Gilbert Rangel NWG:[id number] DOB: 1946/02/14 DOA: 09/13/2023  PCP: Ignatius Specking, MD  Patient coming from: Home  I have personally briefly reviewed patient's old medical records in [facility]  Chief Complaint: RUQ abdominal pain, chills  HPI: Gilbert Rangel is a 77 y.o. male with medical history significant for CKD stage IIIa, GERD, hx of cholecystectomy, spinal stenosis s/p lumbar decompression (L3-4, L4-5) 07/12/2023 who presented to the ED for evaluation of fever, RUQ abdominal pain, and confusion.  Patient states he was in his usual state of health this morning.  After taking a shower he developed significant chills and fatigue.  Afterwards he began to have significant right upper quadrant abdominal pain.  This was associated with nausea but he did not have any emesis.  His family stated that he seemed to be confused/disoriented.  EMS were called.  Per EDP, patient had a fever on their arrival of 12 F.  He was given Excedrin prior to transport to the hospital.  Patient states that he has not had this kind of pain since he had His gallbladder taken out back in 2012.  Abdominal pain has since resolved.  He states he does not take any Tylenol or drink any alcohol.  ED Course  Labs/Imaging on admission: I have personally reviewed following labs and imaging studies.  Initial vitals showed BP 111/80, pulse 109, RR 22, temp 99.1 F, SpO2 95% on room air.  Labs show WBC 14.3, hemoglobin 17.4, platelets 197,000, sodium 137, potassium 3.3, bicarb 21, BUN 16, creatinine 1.35, serum glucose 140.  AST 585, ALT 367, alk phos 213, total bilirubin 4.0.  Lipase 40.  Blood cultures in process.  SARS-CoV-2, influenza, RSV PCR negative.  UA negative for UTI.  Portable chest x-ray showed underinflated lung fields without consolidation, pneumothorax, edema, or effusion.  CT head without contrast negative for acute intracranial abnormality.  CT abdomen/pelvis with contrast  was negative for acute abnormality.  Mild hepatic steatosis and cholecystectomy clips noted.  1.1 cm right adrenal probable adenoma, bilateral nonobstructing renal calculi, colonic diverticulosis, mildly enlarged prostate gland, and small bilateral proximal inguinal hernias containing fat also seen.  RUQ abdominal ultrasound showed increased liver echotexture consistent with hepatic steatosis.  No evidence of biliary duct dilation.  Patient was given 3 L LR, IV vancomycin, cefepime, and Flagyl.  EDP discussed with [facility], Dr. Bosie Clos, their team will see in consultation in the morning.  The hospitalist service was consulted to admit for further evaluation and management.  Review of Systems: All systems reviewed and are negative except as documented in history of present illness above.   Past Medical History:  Diagnosis Date   GERD (gastroesophageal reflux disease)    Hypothyroidism    Macular degeneration    Pneumonia    Thyroid disease     Past Surgical History:  Procedure Laterality Date   CHOLECYSTECTOMY     ELBOW SURGERY Right    EYE SURGERY Bilateral    HEMI-MICRODISCECTOMY LUMBAR LAMINECTOMY LEVEL 1 Bilateral 07/12/2023   Procedure: MICROLUMBAR DECOMPRESSION LUMBAR FOUR-LUMBAR FIVE, POSSIBLE LUMBAR THREE-LUMBAR FOUR;  Surgeon: Jene Every, MD;  Location: [facility] OR;  Service: Orthopedics;  Laterality: Bilateral;   HERNIA REPAIR     KNEE ARTHROSCOPY W/ MENISCAL REPAIR     LUMBAR LAMINECTOMY/DECOMPRESSION MICRODISCECTOMY N/A 09/29/2015   Procedure: MICRO LUMBER DECOMPRESSION L5-S1 AND L4-5 ON RIGHT;  Surgeon: Jene Every, MD;  Location: [facility] ORS;  Service: Orthopedics;  Laterality: N/A;  REPAIR / REINSERT BICEPS TENDON AT ELBOW     ROTATOR CUFF REPAIR     stomach     TONSILLECTOMY      Social History:  reports that he has never smoked. His smokeless tobacco use includes snuff. He reports current alcohol use. He reports that he does not use drugs.  Allergies  Allergen  Reactions   Apraclonidine     Redness    Brimonidine     redness   Netarsudil     redness   Methazolamide Dermatitis    Family History  Problem Relation Age of Onset   Diabetes Mother    Cancer Father      Prior to Admission medications   Medication Sig Start Date End Date Taking? Authorizing Provider  ALPRAZolam Prudy Feeler) 0.5 MG tablet Take 0.5 mg by mouth 3 (three) times daily as needed for anxiety. 01/17/21   [provider]  ARTIFICIAL TEAR SOLUTION OP Place 1 drop into both eyes 3 (three) times daily.    [provider]  augmented betamethasone dipropionate (DIPROLENE-AF) 0.05 % cream Apply 1 Application topically daily as needed (rash). 04/14/23   [provider]  chlorproMAZINE (THORAZINE) 10 MG tablet Take 1 tablet (10 mg total) by mouth 2 (two) times daily as needed for up to 8 doses for hiccoughs. 07/14/23   Sloan Leiter, DO  docusate sodium (COLACE) 100 MG capsule Take 1 capsule (100 mg total) by mouth 2 (two) times daily. 07/12/23 07/11/24  Jene Every, MD  dorzolamide-timolol (COSOPT) 2-0.5 % ophthalmic solution Place 1 drop into both eyes 2 (two) times daily.    [provider]  latanoprost (XALATAN) 0.005 % ophthalmic solution Place 1 drop into both eyes at bedtime. 12/03/20   [provider]  oxyCODONE (OXY IR/ROXICODONE) 5 MG immediate release tablet Take 1 tablet (5 mg total) by mouth every 4 (four) hours as needed for severe pain. 07/12/23   Jene Every, MD  pantoprazole (PROTONIX) 40 MG tablet Take 40 mg by mouth daily before supper.    [provider]  polyethylene glycol (MIRALAX / GLYCOLAX) 17 g packet Take 17 g by mouth daily. 07/12/23   Jene Every, MD  testosterone cypionate (DEPOTESTOSTERONE CYPIONATE) 200 MG/ML injection Inject 100 mg into the muscle every Tuesday. 01/19/21   [provider]  vardenafil (LEVITRA) 20 MG tablet Take 20 mg by mouth daily as needed. 04/03/23   [provider]     Physical Exam: Vitals:   09/13/23 2130 09/13/23 2145 09/13/23 2217 09/13/23 2230  BP: 126/76   129/81  Pulse: 92 78  79  Resp: (!) 30 18  18   Temp:   98 F (36.7 C)   TempSrc:   Oral   SpO2: 94% 95%  95%  Weight:      Height:       Constitutional: Sitting up in bed, NAD, calm, comfortable Eyes: EOMI, lids and conjunctivae normal ENMT: Mucous membranes are moist. Posterior pharynx clear of any exudate or lesions.Normal dentition.  Somewhat hard of hearing. Neck: normal, supple, no masses. Respiratory: clear to auscultation bilaterally, no wheezing, no crackles. Normal respiratory effort. No accessory muscle use.  Cardiovascular: Regular rate and rhythm, no murmurs / rubs / gallops. No extremity edema. 2+ pedal pulses. Abdomen: no tenderness, no masses palpated.  Musculoskeletal: no clubbing / cyanosis. No joint deformity upper and lower extremities. Good ROM, no contractures. Normal muscle tone.  Skin: no rashes, lesions, ulcers. No induration Neurologic:  Sensation intact. Strength  5/5 in all 4.  Psychiatric: Normal judgment and insight. Alert and oriented x 3. Normal mood.   EKG: Ordered and pending.  Assessment/Plan Principal Problem:   Sepsis (HCC) Active Problems:   Transaminitis   Chronic kidney disease, stage 3a (HCC)   Hypokalemia   Gilbert Rangel is a 77 y.o. male with medical history significant for CKD stage IIIa, GERD, hx of cholecystectomy, spinal stenosis s/p lumbar decompression (L3-4, L4-5) 07/12/2023 who is admitted with sepsis associated with transaminitis.  Assessment and Plan: Sepsis with transaminitis: Met sepsis criteria with leukocytosis, tachycardia, fever PTA.  With transaminitis, suspicion is high for acute cholangitis.  CT A/P and RUQ U/S have been nondiagnostic.  He reports a history of cholecystectomy in 2012. -Continue IV Zosyn -Follow blood cultures -Obtain MRCP -Check acute hepatitis panel -[facility] to see in the morning  CKD stage  IIIa: Renal function stable.  Hypokalemia: Supplementing.  Anxiety: Continue home Xanax 0.5 mg at bedtime prn.   DVT prophylaxis: heparin injection 5,000 Units Start: 09/13/23 2315 Code Status: Full code, confirmed with patient on admission Family Communication: Discussed with patient, he has discussed with family Disposition Plan: From home, dispo pending clinical progress Consults called: [facility] Severity of Illness: The appropriate patient status for this patient is INPATIENT. Inpatient status is judged to be reasonable and necessary in order to provide the required intensity of service to ensure the patient's safety. The patient's presenting symptoms, physical exam findings, and initial radiographic and laboratory data in the context of their chronic comorbidities is felt to place them at high risk for further clinical deterioration. Furthermore, it is not anticipated that the patient will be medically stable for discharge from the hospital within 2 midnights of admission.   * I certify that at the point of admission it is my clinical judgment that the patient will require inpatient hospital care spanning beyond 2 midnights from the point of admission due to high intensity of service, high risk for further deterioration and high frequency of surveillance required.Darreld Mclean MD [facility]  If 7PM-7AM, please contact night-coverage [email or web address]  09/13/2023, 11:06 PM

## 2023-09-13 NOTE — ED Provider Notes (Signed)
[place] EMERGENCY DEPARTMENT AT [facility] Provider Note   CSN: [id number] Arrival date & time: 09/13/23  1705     History  Chief Complaint  Gilbert Rangel presents with   Altered Mental Status    Gilbert Rangel is a 77 y.o. male, history of pneumonia with sepsis, spinal stenosis, status post surgery 07/2023, who presents to the ED secondary to confusion, and fever that started this a.m.  History is obtained via his son, as Gilbert Rangel is confused.  Son says that Gilbert Rangel was doing very well yesterday, but this morning, was complaining of some abdominal pain, and looked chilled, and had a fever.  Unknown last fever, EMS states at 69 F, and was given Excedrin, prior to arrival.  Gilbert Rangel says that he has been taking stool softeners, but has not stooled today, but denies any kind of vomiting, nausea, diarrhea.  Son states that Gilbert Rangel has not had any cough, shortness of breath, chest pain, back pain, urinary symptoms.  Has been doing very well lately.  Home Medications Prior to Admission medications   Medication Sig Start Date End Date Taking? Authorizing Provider  ALPRAZolam Prudy Feeler) 0.5 MG tablet Take 0.5 mg by mouth 3 (three) times daily as needed for anxiety. 01/17/21   [provider]  ARTIFICIAL TEAR SOLUTION OP Place 1 drop into both eyes 3 (three) times daily.    [provider]  augmented betamethasone dipropionate (DIPROLENE-AF) 0.05 % cream Apply 1 Application topically daily as needed (rash). 04/14/23   [provider]  chlorproMAZINE (THORAZINE) 10 MG tablet Take 1 tablet (10 mg total) by mouth 2 (two) times daily as needed for up to 8 doses for hiccoughs. 07/14/23   Sloan Leiter, DO  docusate sodium (COLACE) 100 MG capsule Take 1 capsule (100 mg total) by mouth 2 (two) times daily. 07/12/23 07/11/24  Jene Every, MD  dorzolamide-timolol (COSOPT) 2-0.5 % ophthalmic solution Place 1 drop into both eyes 2 (two) times daily.    [provider]   latanoprost (XALATAN) 0.005 % ophthalmic solution Place 1 drop into both eyes at bedtime. 12/03/20   [provider]  oxyCODONE (OXY IR/ROXICODONE) 5 MG immediate release tablet Take 1 tablet (5 mg total) by mouth every 4 (four) hours as needed for severe pain. 07/12/23   Jene Every, MD  pantoprazole (PROTONIX) 40 MG tablet Take 40 mg by mouth daily before supper.    [provider]  polyethylene glycol (MIRALAX / GLYCOLAX) 17 g packet Take 17 g by mouth daily. 07/12/23   Jene Every, MD  testosterone cypionate (DEPOTESTOSTERONE CYPIONATE) 200 MG/ML injection Inject 100 mg into the muscle every Tuesday. 01/19/21   [provider]  vardenafil (LEVITRA) 20 MG tablet Take 20 mg by mouth daily as needed. 04/03/23   [provider]      Allergies    Apraclonidine, Brimonidine, Netarsudil, and Methazolamide    Review of Systems   Review of Systems  Constitutional:  Positive for fever.  Gastrointestinal:  Positive for abdominal pain. Negative for blood in stool.  Psychiatric/Behavioral:  Positive for confusion.     Physical Exam Updated Vital Signs BP 127/80   Pulse 71   Temp 98 F (36.7 C) (Oral)   Resp 19   Ht 5\' 10"  (1.778 m)   Wt 89.8 kg   SpO2 95%   BMI 28.41 kg/m  Physical Exam Vitals and nursing note reviewed.  Constitutional:      General: He is not in acute distress.  Appearance: He is well-developed.  HENT:     Head: Normocephalic and atraumatic.  Eyes:     Conjunctiva/sclera: Conjunctivae normal.  Cardiovascular:     Rate and Rhythm: Normal rate and regular rhythm.     Heart sounds: No murmur heard. Pulmonary:     Effort: Pulmonary effort is normal. No respiratory distress.     Breath sounds: Normal breath sounds.  Abdominal:     Palpations: Abdomen is soft.     Tenderness: There is generalized abdominal tenderness.  Musculoskeletal:        General: No swelling.     Cervical back: Neck supple.  Skin:    General: Skin is  warm and dry.     Capillary Refill: Capillary refill takes less than 2 seconds.  Neurological:     Mental Status: He is alert.     Cranial Nerves: Cranial nerves 2-12 are intact.     Sensory: Sensation is intact.     Motor: Motor function is intact.     Comments: AxO2 (not oriented to year)  Psychiatric:        Mood and Affect: Mood normal.     ED Results / Procedures / Treatments   Labs (all labs ordered are listed, but only abnormal results are displayed) Labs Reviewed  COMPREHENSIVE METABOLIC PANEL - Abnormal; Notable for the following components:      Result Value   Potassium 3.3 (*)    CO2 21 (*)    Glucose, Bld 140 (*)    Creatinine, Ser 1.35 (*)    AST 585 (*)    ALT 367 (*)    Alkaline Phosphatase 213 (*)    Total Bilirubin 4.0 (*)    GFR, Estimated 54 (*)    All other components within normal limits  CBC WITH DIFFERENTIAL/PLATELET - Abnormal; Notable for the following components:   WBC 14.3 (*)    RBC 5.93 (*)    Hemoglobin 17.4 (*)    Neutro Abs 12.8 (*)    Lymphs Abs 0.5 (*)    All other components within normal limits  URINALYSIS, W/ REFLEX TO CULTURE (INFECTION SUSPECTED) - Abnormal; Notable for the following components:   Color, Urine AMBER (*)    Bilirubin Urine Gilbert Rangel (*)    Protein, ur 100 (*)    All other components within normal limits  RESP PANEL BY RT-PCR (RSV, FLU A&B, COVID)  [id number]  CULTURE, BLOOD (ROUTINE X 2)  CULTURE, BLOOD (ROUTINE X 2)  LIPASE, BLOOD  HEPATITIS PANEL, ACUTE  ACETAMINOPHEN LEVEL  PROTIME-INR  PROCALCITONIN  COMPREHENSIVE METABOLIC PANEL  CBC  I-STAT CG4 LACTIC ACID, ED    EKG None  Radiology US Abdomen Limited RUQ (LIVER/GB)  Result Date: 09/13/2023 CLINICAL DATA:  Right upper quadrant pain, prior cholecystectomy EXAM: ULTRASOUND ABDOMEN LIMITED RIGHT UPPER QUADRANT COMPARISON:  09/13/2023 FINDINGS: Gallbladder: Cholecystectomy Common bile duct: Diameter: 3 mm Liver: Increased liver echotexture consistent with  hepatic steatosis. No focal liver abnormality. No intrahepatic biliary duct dilation. Portal vein is patent on color Doppler imaging with normal direction of blood flow towards the liver. Other: None. IMPRESSION: 1. Increased liver echotexture consistent with hepatic steatosis. 2. No evidence of biliary duct dilation. Electronically Signed   By: Sharlet Salina M.D.   On: 09/13/2023 22:40   CT ABDOMEN PELVIS W CONTRAST  Result Date: 09/13/2023 CLINICAL DATA:  Acute, non localized abdominal pain. EXAM: CT ABDOMEN AND PELVIS WITH CONTRAST TECHNIQUE: Multidetector CT imaging of the abdomen and pelvis was performed using  the standard protocol following bolus administration of intravenous contrast. RADIATION DOSE REDUCTION: This exam was performed according to the departmental dose-optimization program which includes automated exposure control, adjustment of the mA and/or kV according to Gilbert Rangel size and/or use of iterative reconstruction technique. CONTRAST:  OMNIPAQUE IOHEXOL 300 MG/ML  SOLN COMPARISON:  Abdomen and pelvis CT report dated 08/12/2012. FINDINGS: Lower chest: Unremarkable. Hepatobiliary: Mild diffuse low-density of the liver. Cholecystectomy clips. Pancreas: Unremarkable. No pancreatic ductal dilatation or surrounding inflammatory changes. Spleen: Normal in size without focal abnormality. Adrenals/Urinary Tract: 1.1 cm relatively low-density right adrenal nodule. Normal-appearing left adrenal gland. 2 mm upper pole left renal calculus and 2 mm mid right renal calculus. No hydronephrosis. Unremarkable ureters and urinary bladder. Stomach/Bowel: Multiple colonic diverticula without evidence of diverticulitis. Unremarkable appendix, Damariz Paganelli bowel and stomach. Vascular/Lymphatic: No significant vascular findings are present. No enlarged abdominal or pelvic lymph nodes. Reproductive: Mildly enlarged prostate gland. Other: Aydin Cavalieri bilateral proximal inguinal hernias containing fat. 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 IMPRESSION: 1. No acute abnormality. 2. Mild hepatic steatosis. 3. 1.1 cm right adrenal probable adenoma. 4. Bilateral nonobstructing renal calculi. 5. Colonic diverticulosis. 6. Mildly enlarged prostate gland. 7. Anjani Feuerborn bilateral proximal inguinal hernias containing fat. Electronically Signed   By: Beckie Salts M.D.   On: 09/13/2023 20:58   CT Head Wo Contrast  Result Date: 09/13/2023 CLINICAL DATA:  Altered mental status EXAM: CT HEAD WITHOUT CONTRAST TECHNIQUE: Contiguous axial images were obtained from the base of the skull through the vertex without intravenous contrast. RADIATION DOSE REDUCTION: This exam was performed according to the departmental dose-optimization program which includes automated exposure control, adjustment of the mA and/or kV according to Gilbert Rangel size and/or use of iterative reconstruction technique. COMPARISON:  03/19/2021 FINDINGS: Brain: No mass, hemorrhage or extra-axial collection. There is hypoattenuation of the white matter. CSF spaces are normal. Vascular: No hyperdense vessel or unexpected calcification. Skull: Normal Sinuses/Orbits: Paranasal sinuses are clear. No mastoid effusion. Normal orbits. Ocular lens replacements and right ocular valve. Other: None IMPRESSION: 1. No acute intracranial abnormality. 2. 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 Electronically Signed   By: Deatra Robinson M.D.   On: 09/13/2023 20:28   DG Chest Port 1 View  Result Date: 09/13/2023 CLINICAL DATA:  Sepsis EXAM: PORTABLE CHEST 1 VIEW COMPARISON:  Chest x-ray 09/17/2015 FINDINGS: The heart size and mediastinal contours are within normal limits. Underinflation. No consolidation, pneumothorax or effusion. No edema. The visualized skeletal structures are unremarkable. Overlapping cardiac leads. Film is under penetrated. Degenerative changes of the spine IMPRESSION: Underinflation.  No acute cardiopulmonary disease.  Electronically Signed   By: Karen Kays M.D.   On: 09/13/2023 19:43    Procedures Procedures    Medications Ordered in ED Medications  lactated ringers infusion (has no administration in time range)  potassium chloride (KLOR-CON) packet 40 mEq (has no administration in time range)  heparin injection 5,000 Units (has no administration in time range)  ondansetron (ZOFRAN) tablet 4 mg (has no administration in time range)    Or  ondansetron (ZOFRAN) injection 4 mg (has no administration in time range)  senna-docusate (Senokot-S) tablet 1 tablet (has no administration in time range)  HYDROmorphone (DILAUDID) injection 0.5 mg (has no administration in time range)  ALPRAZolam (XANAX) tablet 0.5 mg (has no administration in time range)  piperacillin-tazobactam (ZOSYN) IVPB 3.375 g (has no administration in time range)  ceFEPIme (MAXIPIME) 2 g in sodium chloride 0.9 % 100 mL IVPB (  0 g Intravenous Stopped 09/13/23 1816)  metroNIDAZOLE (FLAGYL) IVPB 500 mg (0 mg Intravenous Stopped 09/13/23 1844)  vancomycin (VANCOCIN) IVPB 1000 mg/200 mL premix (0 mg Intravenous Stopped 09/13/23 1852)  lactated ringers bolus 1,000 mL (0 mLs Intravenous Stopped 09/13/23 2022)    And  lactated ringers bolus 1,000 mL (0 mLs Intravenous Stopped 09/13/23 2022)    And  lactated ringers bolus 1,000 mL (0 mLs Intravenous Stopped 09/13/23 2113)  iohexol (OMNIPAQUE) 300 MG/ML solution 100 mL (100 mLs Intravenous Contrast Given 09/13/23 1849)  iohexol (OMNIPAQUE) 300 MG/ML solution 100 mL (100 mLs Intravenous Contrast Given 09/13/23 1916)    ED Course/ Medical Decision Making/ A&P                                 Medical Decision Making Gilbert Rangel is a 77 year old male, here for confusion, fever, and abdominal pain that started this morning.  He is alert and oriented x 2, has not alert oriented to the year, I spoke with his son, who states that the Gilbert Rangel has been doing very well, and only complained of some abdominal pain  today.  Has been having waxing and waning confusion.  Had a fever of 102 per EMS.  Activated sepsis criteria, as Gilbert Rangel was febrile, and confused, as well as tachycardic.  Unknown etiology, given confusion, also will obtain head CT given AMS.  Amount and/or Complexity of Data Reviewed Labs: ordered.    Details: Leukocytosis of 14K, marked transaminitis, urinalysis unremarkable Radiology: ordered.    Details: Chest x-ray is clear, CT abdomen pelvis, shows no acute process, right upper quadrant ultrasound, shows hepatic steatosis, CT head clear ECG/medicine tests: ordered.    Details: Sinus tachycardia Discussion of management or test interpretation with external provider(s): Gilbert Rangel meeting criteria for sepsis, given concern for right upper quadrant etiology, possible cholangitis,?  Possibly due to fever, transaminitis, and confusion.  Spoke with Dr. Bosie Clos, who states they will see in the AM.  Also spoke to Dr. Allena Katz, who accepts admission of Gilbert Rangel, for concern for possible sepsis secondary to the right upper quadrant etiology.  Discussed with son, who voiced understanding and was agreement in plan.  Admitted to hospitalist  Risk Prescription drug management. Decision regarding hospitalization.    CRITICAL CARE Performed by: Pete Pelt   Total critical care time: 35 minutes  Critical care time was exclusive of separately billable procedures and treating other patients.  Critical care was necessary to treat or prevent imminent or life-threatening deterioration.  Critical care was time spent personally by me on the following activities: development of treatment plan with Gilbert Rangel and/or surrogate as well as nursing, discussions with consultants, evaluation of Gilbert Rangel's response to treatment, examination of Gilbert Rangel, obtaining history from Gilbert Rangel or surrogate, ordering and performing treatments and interventions, ordering and review of laboratory studies, ordering and review of  radiographic studies, pulse oximetry and re-evaluation of Gilbert Rangel's condition.  Final Clinical Impression(s) / ED Diagnoses Final diagnoses:  Transaminitis  Generalized abdominal pain  Altered mental status, unspecified altered mental status type    Rx / DC Orders ED Discharge Orders     None         Enos Muhl Elbert Ewings, PA 09/14/23 0002    Coral Spikes, DO 09/14/23 1456

## 2023-09-13 NOTE — Progress Notes (Addendum)
Pharmacy Antibiotic Note  Gilbert Rangel is a 77 y.o. male admitted on 09/13/2023 with  sepsis/intra-abdominal infection .  Pharmacy has been consulted for Zosyn dosing.  Plan: Zosyn 3.375g IV q8h (4 hour infusion). No dose adjustments anticipated.  Pharmacy will sign off and monitor peripherally via electronic surveillance software for any changes in renal function or micro data.   Height: 5\' 10"  (177.8 cm) Weight: 89.8 kg (198 lb) IBW/kg (Calculated) : 73  Temp (24hrs), Avg:98.6 F (37 C), Min:98 F (36.7 C), Max:99.1 F (37.3 C)  Recent Labs  Lab 09/13/23 1729 09/13/23 1737  WBC 14.3*  --   CREATININE 1.35*  --   LATICACIDVEN  --  1.4    Estimated Creatinine Clearance: 51.7 mL/min (A) (by C-G formula based on SCr of 1.35 mg/dL (H)).    Allergies  Allergen Reactions   Apraclonidine     Redness    Brimonidine     redness   Netarsudil     redness   Methazolamide Dermatitis    Antimicrobials this admission: 11/7 Cefepime/Flagyl/Vanc x1 in ED 11/8 Zosyn >>  Dose adjustments this admission:  Microbiology results: 11/7 BCx:   Thank you for allowing pharmacy to be a part of this patient's care.  Junita Push PharmD 09/13/2023 11:24 PM

## 2023-09-13 NOTE — ED Triage Notes (Signed)
Patient BIB [facility] due to Altered Mental Status (EMS called Code Sepsis). Per EMS, pt's son reported general weakness since last night as well as fever and confusion. Son gave Tylenol 1000 mg prior to calling EMS. Patient denies UTI symptoms. Last BM was 09/10/2023. 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

## 2023-09-13 NOTE — Hospital Course (Signed)
Gilbert Rangel is a 77 y.o. male with medical history significant for CKD stage IIIa, GERD, hx of cholecystectomy, spinal stenosis s/p lumbar decompression (L3-4, L4-5) 07/12/2023 who is admitted with sepsis associated with transaminitis.

## 2023-09-14 ENCOUNTER — Inpatient Hospital Stay (HOSPITAL_COMMUNITY): Payer: Medicare Other

## 2023-09-14 ENCOUNTER — Encounter (HOSPITAL_COMMUNITY)
Admission: EM | Disposition: A | Discharge status: Home / Self Care | Payer: Self-pay | Source: Home / Self Care | Attending: Internal Medicine

## 2023-09-14 ENCOUNTER — Encounter (HOSPITAL_COMMUNITY): Payer: Self-pay | Admitting: Internal Medicine

## 2023-09-14 DIAGNOSIS — I700 Atherosclerosis of aorta: Secondary | ICD-10-CM | POA: Diagnosis not present

## 2023-09-14 DIAGNOSIS — K8050 Calculus of bile duct without cholangitis or cholecystitis without obstruction: Secondary | ICD-10-CM | POA: Diagnosis present

## 2023-09-14 DIAGNOSIS — E039 Hypothyroidism, unspecified: Secondary | ICD-10-CM | POA: Diagnosis not present

## 2023-09-14 DIAGNOSIS — R1084 Generalized abdominal pain: Secondary | ICD-10-CM | POA: Diagnosis not present

## 2023-09-14 DIAGNOSIS — R1011 Right upper quadrant pain: Secondary | ICD-10-CM | POA: Diagnosis not present

## 2023-09-14 DIAGNOSIS — R4182 Altered mental status, unspecified: Secondary | ICD-10-CM | POA: Diagnosis not present

## 2023-09-14 DIAGNOSIS — A4159 Other Gram-negative sepsis: Secondary | ICD-10-CM | POA: Diagnosis not present

## 2023-09-14 DIAGNOSIS — N281 Cyst of kidney, acquired: Secondary | ICD-10-CM | POA: Diagnosis not present

## 2023-09-14 DIAGNOSIS — K8031 Calculus of bile duct with cholangitis, unspecified, with obstruction: Secondary | ICD-10-CM | POA: Diagnosis present

## 2023-09-14 DIAGNOSIS — R7401 Elevation of levels of liver transaminase levels: Secondary | ICD-10-CM | POA: Diagnosis not present

## 2023-09-14 DIAGNOSIS — K8309 Other cholangitis: Secondary | ICD-10-CM | POA: Diagnosis not present

## 2023-09-14 DIAGNOSIS — K838 Other specified diseases of biliary tract: Secondary | ICD-10-CM | POA: Diagnosis not present

## 2023-09-14 HISTORY — PX: BILIARY DILATION: SHX6850

## 2023-09-14 HISTORY — PX: ERCP: SHX5425

## 2023-09-14 HISTORY — PX: SPHINCTEROTOMY: SHX5544

## 2023-09-14 HISTORY — PX: REMOVAL OF STONES: SHX5545

## 2023-09-14 LAB — COMPREHENSIVE METABOLIC PANEL
ALT: 246 U/L — ABNORMAL HIGH (ref 0–44)
AST: 205 U/L — ABNORMAL HIGH (ref 15–41)
Albumin: 3.2 g/dL — ABNORMAL LOW (ref 3.5–5.0)
Alkaline Phosphatase: 163 U/L — ABNORMAL HIGH (ref 38–126)
Anion gap: 8 (ref 5–15)
BUN: 13 mg/dL (ref 8–23)
CO2: 23 mmol/L (ref 22–32)
Calcium: 8.2 mg/dL — ABNORMAL LOW (ref 8.9–10.3)
Chloride: 102 mmol/L (ref 98–111)
Creatinine, Ser: 1.26 mg/dL — ABNORMAL HIGH (ref 0.61–1.24)
GFR, Estimated: 59 mL/min — ABNORMAL LOW (ref >60)
Glucose, Bld: 108 mg/dL — ABNORMAL HIGH (ref 70–99)
Potassium: 3.6 mmol/L (ref 3.5–5.1)
Sodium: 133 mmol/L — ABNORMAL LOW (ref 135–145)
Total Bilirubin: 3.7 mg/dL — ABNORMAL HIGH (ref <1.2)
Total Protein: 5.9 g/dL — ABNORMAL LOW (ref 6.5–8.1)

## 2023-09-14 LAB — BLOOD CULTURE ID PANEL (REFLEXED) - BCID2

## 2023-09-14 LAB — CBC
HCT: 43.9 % (ref 39.0–52.0)
Hemoglobin: 14.6 g/dL (ref 13.0–17.0)
MCH: 29.2 pg (ref 26.0–34.0)
MCHC: 33.3 g/dL (ref 30.0–36.0)
MCV: 87.8 fL (ref 80.0–100.0)
Platelets: 169 10*3/uL (ref 150–400)
RBC: 5 MIL/uL (ref 4.22–5.81)
RDW: 13.5 % (ref 11.5–15.5)
WBC: 12.8 10*3/uL — ABNORMAL HIGH (ref 4.0–10.5)
nRBC: 0 % (ref 0.0–0.2)

## 2023-09-14 LAB — ACETAMINOPHEN LEVEL: Acetaminophen (Tylenol), Serum: <10 ug/mL — ABNORMAL LOW (ref 10–30)

## 2023-09-14 LAB — PROTIME-INR
INR: 1.2 (ref 0.8–1.2)
Prothrombin Time: 15.6 s — ABNORMAL HIGH (ref 11.4–15.2)

## 2023-09-14 LAB — HEPATITIS PANEL, ACUTE
HCV Ab: NONREACTIVE
Hep A IgM: NONREACTIVE
Hep B C IgM: NONREACTIVE
Hepatitis B Surface Ag: NONREACTIVE

## 2023-09-14 LAB — PROCALCITONIN: Procalcitonin: 29.61 ng/mL

## 2023-09-14 SURGERY — ERCP, WITH INTERVENTION IF INDICATED
Anesthesia: General

## 2023-09-14 MED ORDER — PHENYLEPHRINE 80 MCG/ML (10ML) SYRINGE FOR IV PUSH (FOR BLOOD PRESSURE SUPPORT)
PREFILLED_SYRINGE | INTRAVENOUS | Status: DC | PRN
  Administered 2023-09-14 (×2): 80 ug via INTRAVENOUS
  Administered 2023-09-14: 40 ug via INTRAVENOUS

## 2023-09-14 MED ORDER — ONDANSETRON HCL 4 MG/2ML IJ SOLN
INTRAMUSCULAR | Status: DC | PRN
  Administered 2023-09-14: 4 mg via INTRAVENOUS

## 2023-09-14 MED ORDER — GADOBUTROL 1 MMOL/ML IV SOLN
9.0000 mL | Freq: Once | INTRAVENOUS | Status: AC | PRN
  Administered 2023-09-14: 9 mL via INTRAVENOUS

## 2023-09-14 MED ORDER — ACETAMINOPHEN 10 MG/ML IV SOLN: 1000.0000 mg | Freq: Once | INTRAVENOUS | Status: DC | PRN

## 2023-09-14 MED ORDER — ROCURONIUM BROMIDE 10 MG/ML (PF) SYRINGE
PREFILLED_SYRINGE | INTRAVENOUS | Status: DC | PRN
  Administered 2023-09-14: 10 mg via INTRAVENOUS
  Administered 2023-09-14: 60 mg via INTRAVENOUS

## 2023-09-14 MED ORDER — METRONIDAZOLE 500 MG/100ML IV SOLN
500.0000 mg | Freq: Two times a day (BID) | INTRAVENOUS | Status: DC
  Administered 2023-09-14 – 2023-09-16 (×5): 500 mg via INTRAVENOUS
  Filled 2023-09-14 (×5): qty 100

## 2023-09-14 MED ORDER — LIDOCAINE 2% (20 MG/ML) 5 ML SYRINGE
INTRAMUSCULAR | Status: DC | PRN
  Administered 2023-09-14: 60 mg via INTRAVENOUS

## 2023-09-14 MED ORDER — SODIUM CHLORIDE 0.9 % IV SOLN
2.0000 g | INTRAVENOUS | Status: DC
  Administered 2023-09-14 – 2023-09-16 (×3): 2 g via INTRAVENOUS
  Filled 2023-09-14 (×3): qty 20

## 2023-09-14 MED ORDER — DICLOFENAC SUPPOSITORY 100 MG
RECTAL | Status: AC
  Filled 2023-09-14: qty 1

## 2023-09-14 MED ORDER — PROPOFOL 10 MG/ML IV BOLUS
INTRAVENOUS | Status: DC | PRN
  Administered 2023-09-14: 100 mg via INTRAVENOUS

## 2023-09-14 MED ORDER — GLUCAGON HCL RDNA (DIAGNOSTIC) 1 MG IJ SOLR
INTRAMUSCULAR | Status: AC
  Filled 2023-09-14: qty 1

## 2023-09-14 MED ORDER — SODIUM CHLORIDE 0.9 % IV SOLN
INTRAVENOUS | Status: DC | PRN
  Administered 2023-09-14: 40 mL

## 2023-09-14 MED ORDER — OXYCODONE HCL 5 MG PO TABS: 5.0000 mg | ORAL_TABLET | Freq: Once | ORAL | Status: DC | PRN

## 2023-09-14 MED ORDER — DROPERIDOL 2.5 MG/ML IJ SOLN: 0.6250 mg | Freq: Once | INTRAMUSCULAR | Status: DC | PRN

## 2023-09-14 MED ORDER — DEXAMETHASONE SODIUM PHOSPHATE 10 MG/ML IJ SOLN
INTRAMUSCULAR | Status: DC | PRN
  Administered 2023-09-14: 5 mg via INTRAVENOUS

## 2023-09-14 MED ORDER — SUGAMMADEX SODIUM 200 MG/2ML IV SOLN
INTRAVENOUS | Status: DC | PRN
  Administered 2023-09-14: 200 mg via INTRAVENOUS

## 2023-09-14 MED ORDER — FENTANYL CITRATE (PF) 100 MCG/2ML IJ SOLN
INTRAMUSCULAR | Status: DC | PRN
  Administered 2023-09-14 (×2): 25 ug via INTRAVENOUS
  Administered 2023-09-14: 50 ug via INTRAVENOUS

## 2023-09-14 MED ORDER — FENTANYL CITRATE PF 50 MCG/ML IJ SOSY
25.0000 ug | PREFILLED_SYRINGE | INTRAMUSCULAR | Status: DC | PRN
Start: 2023-09-14 — End: 2023-09-14

## 2023-09-14 MED ORDER — FENTANYL CITRATE (PF) 100 MCG/2ML IJ SOLN
INTRAMUSCULAR | Status: AC
  Filled 2023-09-14: qty 2

## 2023-09-14 MED ORDER — LACTATED RINGERS IV SOLN: INTRAVENOUS | Status: DC | PRN

## 2023-09-14 MED ORDER — DICLOFENAC SUPPOSITORY 100 MG
RECTAL | Status: DC | PRN
  Administered 2023-09-14: 100 mg via RECTAL

## 2023-09-14 MED ORDER — OXYCODONE HCL 5 MG/5ML PO SOLN: 5.0000 mg | Freq: Once | ORAL | Status: DC | PRN

## 2023-09-14 NOTE — Anesthesia Preprocedure Evaluation (Signed)
Anesthesia Evaluation  Patient identified by MRN, date of birth, ID band Patient awake    Reviewed: Allergy & Precautions, H&P , NPO status , Patient's Chart, lab work & pertinent test results  Airway Mallampati: II  TM Distance: >3 FB Neck ROM: Full    Dental no notable dental hx. (+) Teeth Intact, Poor Dentition, Dental Advisory Given   Pulmonary neg pulmonary ROS, Patient abstained from smoking.   Pulmonary exam normal breath sounds clear to auscultation       Cardiovascular Exercise Tolerance: Good negative cardio ROS Normal cardiovascular exam Rhythm:Regular Rate:Normal  ECHO 16 Left ventricle:  The cavity size was normal. Systolic function was  normal. The estimated ejection fraction was in the range of 55% to  60%. Wall motion was normal; there were no regional wall motion  abnormalities. The transmitral flow pattern was normal. The  deceleration time of the early transmitral flow velocity was  normal. The pulmonary vein flow pattern was normal. The tissue  Doppler parameters were normal. Doppler parameters are consistent  with abnormal left ventricular relaxation (grade 1 diastolic  dysfunction)     Neuro/Psych  Neuromuscular disease negative neurological ROS  negative psych ROS   GI/Hepatic Neg liver ROS,GERD  ,,choledocolithiasis   Endo/Other  negative endocrine ROSHypothyroidism    Renal/GU CRFRenal disease  negative genitourinary   Musculoskeletal negative musculoskeletal ROS (+)    Abdominal   Peds negative pediatric ROS (+)  Hematology negative hematology ROS (+)   Anesthesia Other Findings   Reproductive/Obstetrics negative OB ROS                              Anesthesia Physical Anesthesia Plan  ASA: 3  Anesthesia Plan: General   Post-op Pain Management:    Induction: Intravenous  PONV Risk Score and Plan: 2 and Ondansetron, Dexamethasone and Treatment may  vary due to age or medical condition  Airway Management Planned: Oral ETT  Additional Equipment: None  Intra-op Plan:   Post-operative Plan: Extubation in OR  Informed Consent: I have reviewed the patients History and Physical, chart, labs and discussed the procedure including the risks, benefits and alternatives for the proposed anesthesia with the patient or authorized representative who has indicated his/her understanding and acceptance.       Plan Discussed with: Anesthesiologist and CRNA  Anesthesia Plan Comments: (  )         Anesthesia Quick Evaluation

## 2023-09-14 NOTE — Transfer of Care (Signed)
Immediate Anesthesia Transfer of Care Note  Patient: Gilbert Rangel  Procedure(s) Performed: ENDOSCOPIC RETROGRADE CHOLANGIOPANCREATOGRAPHY (ERCP) SPHINCTEROTOMY BILIARY DILATION REMOVAL OF STONES  Patient Location: PACU and Endoscopy Unit  Anesthesia Type:General  Level of Consciousness: awake, alert , and oriented  Airway & Oxygen Therapy: Patient Spontanous Breathing and Patient connected to face mask oxygen  Post-op Assessment: Report given to RN and Post -op Vital signs reviewed and stable  Post vital signs: Reviewed and stable  Last Vitals:  Vitals Value Taken Time  BP    Temp    Pulse    Resp    SpO2      Last Pain:  Vitals:   09/14/23 1029  TempSrc: Tympanic  PainSc: 0-No pain         Complications: No notable events documented.

## 2023-09-14 NOTE — Anesthesia Procedure Notes (Signed)
Procedure Name: Intubation Date/Time: 09/14/2023 11:01 AM  Performed by: Nelle Don, CRNAPre-anesthesia Checklist: Patient identified, Emergency Drugs available, Suction available and Patient being monitored Patient Re-evaluated:Patient Re-evaluated prior to induction Oxygen Delivery Method: Circle system utilized Preoxygenation: Pre-oxygenation with 100% oxygen Induction Type: IV induction Ventilation: Mask ventilation without difficulty Laryngoscope Size: Mac and 4 Grade View: Grade I Tube type: Oral Tube size: 7.0 mm Number of attempts: 1 Airway Equipment and Method: Stylet Placement Confirmation: ETT inserted through vocal cords under direct vision, positive ETCO2 and breath sounds checked- equal and bilateral Secured at: 23 cm Tube secured with: Tape Dental Injury: Teeth and Oropharynx as per pre-operative assessment

## 2023-09-14 NOTE — Op Note (Signed)
[facility] Patient Name: Gilbert Rangel Procedure Date: 09/14/2023 MRN: [id number] Attending MD: Vida Rigger , MD, [id number] Date of Birth: 1946/06/17 CSN: [id number] Age: 77 Admit Type: Inpatient Procedure:                ERCP Indications:              Abnormal MRCP, Suspected bile duct stone(s),                            Elevated liver enzymes Providers:                Vida Rigger, MD, Suzy Bouchard, RN, Margaree Mackintosh, RN, Marja Kays, Technician,                            Geoffery Lyons, Technician Referring MD:              Medicines:                General Anesthesia Complications:            No immediate complications. Estimated Blood Loss:     Estimated blood loss was minimal. Procedure:                Pre-Anesthesia Assessment:                           - Prior to the procedure, a History and Physical                            was performed, and patient medications and                            allergies were reviewed. The patient's tolerance of                            previous anesthesia was also reviewed. The risks                            and benefits of the procedure and the sedation                            options and risks were discussed with the patient.                            All questions were answered, and informed consent                            was obtained. Prior Anticoagulants: The patient has                            taken no anticoagulant or antiplatelet agents. ASA                            Grade Assessment: II -  A patient with mild systemic                            disease. After reviewing the risks and benefits,                            the patient was deemed in satisfactory condition to                            undergo the procedure.                           After obtaining informed consent, the scope was                            passed under direct vision. Throughout the                             procedure, the patient's blood pressure, pulse, and                            oxygen saturations were monitored continuously. The                            [id number]-Q190V ([id number] Olympus duodenoscope was                            introduced through the mouth, and used to inject                            contrast into and used to cannulate the bile duct.                            Initially we had trouble passing the scope so we                            switched to the upper endoscope i.e. 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 We then  switched back to the side-viewing ERCP scope and                            were able to advance it into the duodenum but                            finding the ampulla was very difficult and the ERCP                            was technically difficult and complex due to                            challenging cannulation because of abnormal                            anatomy. Successful completion of the procedure was                            aided by performing the maneuvers documented                            (below) in this report. The patient tolerated the                            procedure well. 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 It was       difficult to find and the major papilla was somewhat edematous. Deep       selective cannulation was obtained and there was no pancreatic duct wire       advancement or injection throughout the procedure and we proceeded with       a medium size  biliary sphincterotomy which was made with a Hydratome       sphincterotome using ERBE electrocautery. There was no       post-sphincterotomy bleeding. We could get the fully bowed       sphincterotome easily in and out of the duct and had adequate biliary       drainage at this point and to discover objects, the biliary tree was       swept with an adjustable 12- 15 mm balloon starting at the bifurcation.       We inflated the 15 at the bifurcation but to pass through this patent       sphincterotomy site required lowering into the 12 which passed fairly       easily through the patent sphincterotomy site and a little sludge was       swept from the duct. At least 1 stone was seen to pass and on subsequent       balloon pull-through's. Nothing was found. Unfortunately he was not       draining well so we increased the biliary sphincterotomy was made with a       Hydratome sphincterotome using ERBE electrocautery. There was no       post-sphincterotomy bleeding. This did not seem to improve his drainage       so we will we proceeded with dilation of the common bile  duct with a 4       cm by 8 mm balloon dilator which was successful. To discover objects       just to be sure at the end of the procedure, the biliary tree was swept       with a 15 mm balloon starting at the bifurcation. Nothing was found. As       mentioned above A standard esophagogastroduodenoscopy scope was used for       the examination of the upper gastrointestinal tract. The scope was       passed under direct vision through the upper GI tract. A small hiatal       hernia was present. A standard esophagogastroduodenoscopy scope was used       for the examination of the upper gastrointestinal tract. The scope was       passed under direct vision through the upper GI tract. The upper GI       tract was normal other than the small hiatal hernia and the duodenal       diverticulum seen as above. And the patient tolerated the  procedure well       there was no obvious immediate complication Impression:               - The major papilla was located partially within a                            diverticulum.                           - The major papilla appeared slightly edematous.                           - Choledocholithiasis was found. Complete removal                            was accomplished by biliary sphincterotomy and                            balloon extraction.                           - A biliary sphincterotomy was performed and then                            extended in the middle of the procedure as above.                           - The biliary tree was swept and nothing was found                            at the end of the procedure and on occlusion                            cholangiogram.                           -                           -  Common bile duct was successfully dilated.                           - The biliary tree was swept and nothing was found                            at the end of the procedure. Moderate Sedation:      Not Applicable - Patient had care per Anesthesia. Recommendation:           - Clear liquid diet for 6 hours. If doing well this                            evening may have soft solids if not may slowly                            advance tomorrow                           - Continue present medications.                           - Return to GI clinic PRN.                           - Telephone GI clinic if symptomatic PRN.                           - Check liver enzymes (AST, ALT, alkaline                            phosphatase, bilirubin) in the morning and follow                            back to normal as an outpatient. Procedure Code(s):        --- Professional ---                           [phone], 59, Endoscopic retrograde                            cholangiopancreatography (ERCP); with                            trans-endoscopic balloon dilation  of                            biliary/pancreatic duct(s) or of ampulla                            (sphincteroplasty), including sphincterotomy, when                            performed, each duct                           [id number], Endoscopic retrograde  cholangiopancreatography (ERCP); with removal of                            calculi/debris from biliary/pancreatic duct(s) Diagnosis Code(s):        --- Professional ---                           K80.50, Calculus of bile duct without cholangitis                            or cholecystitis without obstruction                           R74.8, Abnormal levels of other serum enzymes                           K83.8, Other specified diseases of biliary tract                           R93.2, Abnormal findings on diagnostic imaging of                            liver and biliary tract CPT copyright 2022 [employer]. All rights reserved. The codes documented in this report are preliminary and upon coder review may  be revised to meet current compliance requirements. Vida Rigger, MD 09/14/2023 1:14:58 PM This report has been signed electronically. Number of Addenda: 0

## 2023-09-14 NOTE — Anesthesia Postprocedure Evaluation (Signed)
Anesthesia Post Note  Patient: SERAPHIM LESNER  Procedure(s) Performed: ENDOSCOPIC RETROGRADE CHOLANGIOPANCREATOGRAPHY (ERCP) SPHINCTEROTOMY BILIARY DILATION REMOVAL OF STONES     Patient location during evaluation: PACU Anesthesia Type: General Level of consciousness: awake and alert Pain management: pain level controlled Vital Signs Assessment: post-procedure vital signs reviewed and stable Respiratory status: spontaneous breathing, nonlabored ventilation, respiratory function stable and patient connected to nasal cannula oxygen Cardiovascular status: blood pressure returned to baseline and stable Postop Assessment: no apparent nausea or vomiting Anesthetic complications: no   No notable events documented.  Last Vitals:  Vitals:   09/14/23 1330 09/14/23 1348  BP: (!) 149/74 (!) 144/87  Pulse: 69 73  Resp: 17 18  Temp:  36.4 C  SpO2: 94% 95%    Last Pain:  Vitals:   09/14/23 2000  TempSrc:   PainSc: 0-No pain                 [place]

## 2023-09-14 NOTE — Consult Note (Signed)
Reason for Consult: CBD stone Referring Physician: Hospital team  Gilbert Rangel is an 77 y.o. male.  HPI: Patient seen and examined and his hospital computer chart reviewed and he had his gallbladder out 12 years ago and does not remember an endoscopic procedure associated with it and he had a colonoscopy about 20 years ago and his family history is pertinent for dad with pancreatic cancer but no other GI issues run in the family and he denies any GI problems until yesterday when he had abdominal pain and significant fever and some altered mental status for a short time and the pain seemed to resolve fairly quickly but when he presented to the hospital he had significant elevated liver tests and although ultrasound and CT scan were nondiagnostic MRI showed a CBD stone as well as air in the biliary tree which might be from passing a stone or a previous sphincterotomy that he was not aware of this time and I could not find evidence of that on care everywhere and today he is feeling much better without any pain  Past Medical History:  Diagnosis Date   GERD (gastroesophageal reflux disease)    Hypothyroidism    Macular degeneration    Pneumonia    Thyroid disease     Past Surgical History:  Procedure Laterality Date   CHOLECYSTECTOMY     ELBOW SURGERY Right    EYE SURGERY Bilateral    HEMI-MICRODISCECTOMY LUMBAR LAMINECTOMY LEVEL 1 Bilateral 07/12/2023   Procedure: MICROLUMBAR DECOMPRESSION LUMBAR FOUR-LUMBAR FIVE, POSSIBLE LUMBAR THREE-LUMBAR FOUR;  Surgeon: Jene Every, MD;  Location: [facility];  Service: Orthopedics;  Laterality: Bilateral;   HERNIA REPAIR     KNEE ARTHROSCOPY W/ MENISCAL REPAIR     LUMBAR LAMINECTOMY/DECOMPRESSION MICRODISCECTOMY N/A 09/29/2015   Procedure: MICRO LUMBER DECOMPRESSION L5-S1 AND L4-5 ON RIGHT;  Surgeon: Jene Every, MD;  Location: [facility];  Service: Orthopedics;  Laterality: N/A;   REPAIR / REINSERT BICEPS TENDON AT ELBOW     ROTATOR CUFF REPAIR     stomach      TONSILLECTOMY      Family History  Problem Relation Age of Onset   Diabetes Mother    Cancer Father     Social History:  reports that he has never smoked. His smokeless tobacco use includes snuff. He reports current alcohol use. He reports that he does not use drugs.  Allergies:  Allergies  Allergen Reactions   Apraclonidine     Redness    Brimonidine     redness   Netarsudil     redness   Methazolamide Dermatitis    Medications: I have reviewed the patient's current medications.  Results for orders placed or performed during the hospital encounter of 09/13/23 (from the past 48 hour(s))  Resp panel by RT-PCR (RSV, Flu A&B, Covid) Anterior Nasal Swab     Status: None   Collection Time: 09/13/23  5:15 PM   Specimen: Anterior Nasal Swab  Result Value Ref Range   SARS Coronavirus 2 by RT PCR NEGATIVE NEGATIVE    Comment: (NOTE) SARS-CoV-2 target nucleic acids are NOT DETECTED.  The SARS-CoV-2 RNA is generally detectable in upper respiratory specimens during the acute phase of infection. The lowest concentration of SARS-CoV-2 viral copies this assay can detect is 138 copies/mL. A negative result does not preclude SARS-Cov-2 infection and should not be used as the sole basis for treatment or other patient management decisions. A negative result may occur with  improper specimen collection/handling, submission of  specimen other than nasopharyngeal swab, presence of viral mutation(s) within the areas targeted by this assay, and inadequate number of viral copies(<138 copies/mL). A negative result must be combined with clinical observations, patient history, and epidemiological information. The expected result is Negative.  Fact Sheet for Patients:  BloggerCourse.com  Fact Sheet for Healthcare Providers:  SeriousBroker.it  This test is no t yet approved or cleared by the [place] FDA and  has been authorized for  detection and/or diagnosis of SARS-CoV-2 by FDA under an Emergency Use Authorization (EUA). This EUA will remain  in effect (meaning this test can be used) for the duration of the COVID-19 declaration under Section 564(b)(1) of the Act, 21 U.S.C.section 360bbb-3(b)(1), unless the authorization is terminated  or revoked sooner.       Influenza A by PCR NEGATIVE NEGATIVE   Influenza B by PCR NEGATIVE NEGATIVE    Comment: (NOTE) The Xpert Xpress SARS-CoV-2/FLU/RSV plus assay is intended as an aid in the diagnosis of influenza from Nasopharyngeal swab specimens and should not be used as a sole basis for treatment. Nasal washings and aspirates are unacceptable for Xpert Xpress SARS-CoV-2/FLU/RSV testing.  Fact Sheet for Patients: BloggerCourse.com  Fact Sheet for Healthcare Providers: SeriousBroker.it  This test is not yet approved or cleared by the [place] FDA and has been authorized for detection and/or diagnosis of SARS-CoV-2 by FDA under an Emergency Use Authorization (EUA). This EUA will remain in effect (meaning this test can be used) for the duration of the COVID-19 declaration under Section 564(b)(1) of the Act, 21 U.S.C. section 360bbb-3(b)(1), unless the authorization is terminated or revoked.     Resp Syncytial Virus by PCR NEGATIVE NEGATIVE    Comment: (NOTE) Fact Sheet for Patients: BloggerCourse.com  Fact Sheet for Healthcare Providers: SeriousBroker.it  This test is not yet approved or cleared by the [place] FDA and has been authorized for detection and/or diagnosis of SARS-CoV-2 by FDA under an Emergency Use Authorization (EUA). This EUA will remain in effect (meaning this test can be used) for the duration of the COVID-19 declaration under Section 564(b)(1) of the Act, 21 U.S.C. section 360bbb-3(b)(1), unless the authorization is terminated  or revoked.  Performed at [facility], [address], [place], [place] [address]   Comprehensive metabolic panel     Status: Abnormal   Collection Time: 09/13/23  5:29 PM  Result Value Ref Range   Sodium 137 135 - 145 mmol/L   Potassium 3.3 (L) 3.5 - 5.1 mmol/L   Chloride 101 98 - 111 mmol/L   CO2 21 (L) 22 - 32 mmol/L   Glucose, Bld 140 (H) 70 - 99 mg/dL    Comment: Glucose reference range applies only to samples taken after fasting for at least 8 hours.   BUN 16 8 - 23 mg/dL   Creatinine, Ser 7.42 (H) 0.61 - 1.24 mg/dL   Calcium 9.1 8.9 - 59.5 mg/dL   Total Protein 7.2 6.5 - 8.1 g/dL   Albumin 3.9 3.5 - 5.0 g/dL   AST 638 (H) 15 - 41 U/L   ALT 367 (H) 0 - 44 U/L   Alkaline Phosphatase 213 (H) 38 - 126 U/L   Total Bilirubin 4.0 (H) <1.2 mg/dL   GFR, Estimated 54 (L) >60 mL/min    Comment: (NOTE) Calculated using the CKD-EPI Creatinine Equation (2021)    Anion gap 15 5 - 15    Comment: Performed at [facility], [address] [place], [place]  [address]  CBC with Differential     Status: Abnormal   Collection Time: 09/13/23  5:29 PM  Result Value Ref Range   WBC 14.3 (H) 4.0 - 10.5 K/uL   RBC 5.93 (H) 4.22 - 5.81 MIL/uL   Hemoglobin 17.4 (H) 13.0 - 17.0 g/dL   HCT 19.1 47.8 - 29.5 %   MCV 87.0 80.0 - 100.0 fL   MCH 29.3 26.0 - 34.0 pg   MCHC 33.7 30.0 - 36.0 g/dL   RDW 62.1 30.8 - 65.7 %   Platelets 197 150 - 400 K/uL   nRBC 0.0 0.0 - 0.2 %   Neutrophils Relative % 91 %   Neutro Abs 12.8 (H) 1.7 - 7.7 K/uL   Lymphocytes Relative 3 %   Lymphs Abs 0.5 (L) 0.7 - 4.0 K/uL   Monocytes Relative 6 %   Monocytes Absolute 0.9 0.1 - 1.0 K/uL   Eosinophils Relative 0 %   Eosinophils Absolute 0.0 0.0 - 0.5 K/uL   Basophils Relative 0 %   Basophils Absolute 0.0 0.0 - 0.1 K/uL   Immature Granulocytes 0 %   Abs Immature Granulocytes 0.06 0.00 - 0.07 K/uL    Comment: Performed at [facility], [address]  [place], [place] [address]  Blood Culture (routine x 2)     Status: None (Preliminary result)   Collection Time: 09/13/23  5:29 PM   Specimen: BLOOD RIGHT HAND  Result Value Ref Range   Specimen Description      BLOOD RIGHT HAND Performed at [facility], [address] [place], [place] [address]    Special Requests      BOTTLES DRAWN AEROBIC AND ANAEROBIC Blood Culture adequate volume Performed at [facility], [address] [place], [place] [address]    Culture  Setup Time      GRAM NEGATIVE RODS IN BOTH AEROBIC AND ANAEROBIC BOTTLES CRITICAL RESULT CALLED TO, READ BACK BY AND VERIFIED WITH: Rosiland Oz [id number] AT 0811 BY EC Performed at [facility], [address], [place], [place] [address]    Culture GRAM NEGATIVE RODS    Report Status PENDING   Lipase, blood     Status: None   Collection Time: 09/13/23  5:29 PM  Result Value Ref Range   Lipase 40 11 - 51 U/L    Comment: Performed at [facility], [address] [place], [place] [address]  Blood Culture ID Panel (Reflexed)     Status: Abnormal   Collection Time: 09/13/23  5:29 PM  Result Value Ref Range   Enterococcus faecalis NOT DETECTED NOT DETECTED   Enterococcus Faecium NOT DETECTED NOT DETECTED   Listeria monocytogenes NOT DETECTED NOT DETECTED   Staphylococcus species NOT DETECTED NOT DETECTED   Staphylococcus aureus (BCID) NOT DETECTED NOT DETECTED   Staphylococcus epidermidis NOT DETECTED NOT DETECTED   Staphylococcus lugdunensis NOT DETECTED NOT DETECTED   Streptococcus species NOT DETECTED NOT DETECTED   Streptococcus agalactiae NOT DETECTED NOT DETECTED   Streptococcus pneumoniae NOT DETECTED NOT DETECTED   Streptococcus pyogenes NOT DETECTED NOT DETECTED   A.calcoaceticus-baumannii NOT DETECTED NOT DETECTED   Bacteroides fragilis NOT DETECTED NOT DETECTED   Enterobacterales DETECTED (A) NOT DETECTED    Comment: Enterobacterales represent a large  order of gram negative bacteria, not a single organism. CRITICAL RESULT CALLED TO, READ BACK BY AND VERIFIED WITH: PHARMD JUSTIN LEGGE [id number] AT 0811 BY EC    Enterobacter cloacae complex  NOT DETECTED NOT DETECTED   Escherichia coli NOT DETECTED NOT DETECTED   Klebsiella aerogenes NOT DETECTED NOT DETECTED   Klebsiella oxytoca NOT DETECTED NOT DETECTED   Klebsiella pneumoniae DETECTED (A) NOT DETECTED    Comment: CRITICAL RESULT CALLED TO, READ BACK BY AND VERIFIED WITH: PHARMD JUSTIN LEGGE [id number] AT 0811 BY EC    Proteus species NOT DETECTED NOT DETECTED   Salmonella species NOT DETECTED NOT DETECTED   Serratia marcescens NOT DETECTED NOT DETECTED   Haemophilus influenzae NOT DETECTED NOT DETECTED   Neisseria meningitidis NOT DETECTED NOT DETECTED   Pseudomonas aeruginosa NOT DETECTED NOT DETECTED   Stenotrophomonas maltophilia NOT DETECTED NOT DETECTED   Candida albicans NOT DETECTED NOT DETECTED   Candida auris NOT DETECTED NOT DETECTED   Candida glabrata NOT DETECTED NOT DETECTED   Candida krusei NOT DETECTED NOT DETECTED   Candida parapsilosis NOT DETECTED NOT DETECTED   Candida tropicalis NOT DETECTED NOT DETECTED   Cryptococcus neoformans/gattii NOT DETECTED NOT DETECTED   CTX-M ESBL NOT DETECTED NOT DETECTED   Carbapenem resistance IMP NOT DETECTED NOT DETECTED   Carbapenem resistance KPC NOT DETECTED NOT DETECTED   Carbapenem resistance NDM NOT DETECTED NOT DETECTED   Carbapenem resist OXA 48 LIKE NOT DETECTED NOT DETECTED   Carbapenem resistance VIM NOT DETECTED NOT DETECTED    Comment: Performed at [facility], [address], [place], [place] [address]  I-Stat Lactic Acid, ED     Status: None   Collection Time: 09/13/23  5:37 PM  Result Value Ref Range   Lactic Acid, Venous 1.4 0.5 - 1.9 mmol/L  Blood Culture (routine x 2)     Status: None (Preliminary result)   Collection Time: 09/13/23  5:38 PM   Specimen: BLOOD LEFT HAND  Result Value Ref Range    Specimen Description      BLOOD LEFT HAND Performed at [facility], [address], [place], [place] [address]    Special Requests      BOTTLES DRAWN AEROBIC AND ANAEROBIC Blood Culture adequate volume Performed at [facility], [address] [place], [place] [address]    Culture  Setup Time      GRAM NEGATIVE RODS IN BOTH AEROBIC AND ANAEROBIC BOTTLES CRITICAL VALUE NOTED.  VALUE IS CONSISTENT WITH PREVIOUSLY REPORTED AND CALLED VALUE. Performed at [facility], [address], [place], [place] [address]    Culture GRAM NEGATIVE RODS    Report Status PENDING   Urinalysis, w/ Reflex to Culture (Infection Suspected) -Urine, Catheterized     Status: Abnormal   Collection Time: 09/13/23  5:38 PM  Result Value Ref Range   Specimen Source URINE, CATHETERIZED    Color, Urine AMBER (A) YELLOW    Comment: BIOCHEMICALS MAY BE AFFECTED BY COLOR   APPearance CLEAR CLEAR   Specific Gravity, Urine 1.027 1.005 - 1.030   pH 5.0 5.0 - 8.0   Glucose, UA NEGATIVE NEGATIVE mg/dL   Hgb urine dipstick NEGATIVE NEGATIVE   Bilirubin Urine SMALL (A) NEGATIVE   Ketones, ur NEGATIVE NEGATIVE mg/dL   Protein, ur 865 (A) NEGATIVE mg/dL   Nitrite NEGATIVE NEGATIVE   Leukocytes,Ua NEGATIVE NEGATIVE   RBC / HPF 0-5 0 - 5 RBC/hpf   WBC, UA 0-5 0 - 5 WBC/hpf    Comment:        Reflex urine culture not performed if WBC <=10, OR if Squamous epithelial cells >5. If Squamous epithelial cells >5 suggest recollection.  Bacteria, UA NONE SEEN NONE SEEN   Squamous Epithelial / HPF 0-5 0 - 5 /HPF   Mucus PRESENT     Comment: Performed at [facility], [address] [place], [place] [address]  Hepatitis panel, acute     Status: None (Preliminary result)   Collection Time: 09/14/23 12:22 AM  Result Value Ref Range   Hepatitis B Surface Ag NON REACTIVE NON REACTIVE    Comment: Performed at [facility], [address] [place], [place] [address]    HCV Ab PENDING NON REACTIVE   Hep A IgM PENDING NON REACTIVE   Hep B C IgM PENDING NON REACTIVE  Acetaminophen level     Status: Abnormal   Collection Time: 09/14/23 12:22 AM  Result Value Ref Range   Acetaminophen (Tylenol), Serum <10 (L) 10 - 30 ug/mL    Comment: (NOTE) Therapeutic concentrations vary significantly. A range of 10-30 ug/mL  may be an effective concentration for many patients. However, some  are best treated at concentrations outside of this range. Acetaminophen concentrations >150 ug/mL at 4 hours after ingestion  and >50 ug/mL at 12 hours after ingestion are often associated with  toxic reactions.  Performed at [facility], [address], [place], [place] [address]   Protime-INR     Status: Abnormal   Collection Time: 09/14/23  4:49 AM  Result Value Ref Range   Prothrombin Time 15.6 (H) 11.4 - 15.2 seconds   INR 1.2 0.8 - 1.2    Comment: (NOTE) INR goal varies based on device and disease states. Performed at [facility], [address], [place], [place] [address]   Procalcitonin     Status: None   Collection Time: 09/14/23  4:49 AM  Result Value Ref Range   Procalcitonin 29.61 ng/mL    Comment:        Interpretation: PCT >= 10 ng/mL: Important systemic inflammatory response, almost exclusively due to severe bacterial sepsis or septic shock. (NOTE)       Sepsis PCT Algorithm           Lower Respiratory Tract                                      Infection PCT Algorithm    ----------------------------     ----------------------------         PCT < 0.25 ng/mL                PCT < 0.10 ng/mL          Strongly encourage             Strongly discourage   discontinuation of antibiotics    initiation of antibiotics    ----------------------------     -----------------------------       PCT 0.25 - 0.50 ng/mL            PCT 0.10 - 0.25 ng/mL               OR       >80% decrease in PCT            Discourage initiation  of  antibiotics      Encourage discontinuation           of antibiotics    ----------------------------     -----------------------------         PCT >= 0.50 ng/mL              PCT 0.26 - 0.50 ng/mL                AND       <80% decrease in PCT             Encourage initiation of                                             antibiotics       Encourage continuation           of antibiotics    ----------------------------     -----------------------------        PCT >= 0.50 ng/mL                  PCT > 0.50 ng/mL               AND         increase in PCT                  Strongly encourage                                      initiation of antibiotics    Strongly encourage escalation           of antibiotics                                     -----------------------------                                           PCT <= 0.25 ng/mL                                                 OR                                        > 80% decrease in PCT                                      Discontinue / Do not initiate                                             antibiotics  Performed at [facility], [address] [place], [place] [address]   Comprehensive metabolic panel     Status: Abnormal   Collection  Time: 09/14/23  4:49 AM  Result Value Ref Range   Sodium 133 (L) 135 - 145 mmol/L   Potassium 3.6 3.5 - 5.1 mmol/L   Chloride 102 98 - 111 mmol/L   CO2 23 22 - 32 mmol/L   Glucose, Bld 108 (H) 70 - 99 mg/dL    Comment: Glucose reference range applies only to samples taken after fasting for at least 8 hours.   BUN 13 8 - 23 mg/dL   Creatinine, Ser 5.78 (H) 0.61 - 1.24 mg/dL   Calcium 8.2 (L) 8.9 - 10.3 mg/dL   Total Protein 5.9 (L) 6.5 - 8.1 g/dL   Albumin 3.2 (L) 3.5 - 5.0 g/dL   AST 469 (H) 15 - 41 U/L   ALT 246 (H) 0 - 44 U/L   Alkaline Phosphatase 163 (H) 38 - 126 U/L   Total Bilirubin 3.7 (H) <1.2 mg/dL   GFR,  Estimated 59 (L) >60 mL/min    Comment: (NOTE) Calculated using the CKD-EPI Creatinine Equation (2021)    Anion gap 8 5 - 15    Comment: Performed at [facility], [address] [place], [place] [address]  CBC     Status: Abnormal   Collection Time: 09/14/23  4:49 AM  Result Value Ref Range   WBC 12.8 (H) 4.0 - 10.5 K/uL   RBC 5.00 4.22 - 5.81 MIL/uL   Hemoglobin 14.6 13.0 - 17.0 g/dL   HCT 84.1 32.4 - 40.1 %   MCV 87.8 80.0 - 100.0 fL   MCH 29.2 26.0 - 34.0 pg   MCHC 33.3 30.0 - 36.0 g/dL   RDW 02.7 25.3 - 66.4 %   Platelets 169 150 - 400 K/uL   nRBC 0.0 0.0 - 0.2 %    Comment: Performed at [facility], [address] [place], [place] [address]    MR 3D Recon At Scanner  Result Date: 09/14/2023 CLINICAL DATA:  Right upper quadrant abdominal pain EXAM: MRI ABDOMEN WITHOUT AND WITH CONTRAST (INCLUDING MRCP) TECHNIQUE: Multiplanar multisequence MR imaging of the abdomen was performed both before and after the administration of intravenous contrast. Heavily T2-weighted images of the biliary and pancreatic ducts were obtained, and three-dimensional MRCP images were rendered by post processing. CONTRAST:  9mL GADAVIST GADOBUTROL 1 MMOL/ML IV SOLN COMPARISON:  Abdominal ultrasound, 09/13/2023, CT abdomen pelvis, 09/13/2023 FINDINGS: Lower chest: No acute abnormality. Hepatobiliary: No focal liver abnormality is seen. Status post cholecystectomy. Common bile duct is dilated up to 1.0 cm, however tapers smoothly to the ampulla. Postoperative pneumobilia. Suspect a small calculus dependently within the central common bile duct near the ampulla measuring no greater than 0.4 cm (series 4, image 25). Pancreas: Unremarkable. No pancreatic ductal dilatation or surrounding inflammatory changes. Spleen: Normal in size without significant abnormality. Adrenals/Urinary Tract: Small, definitively benign macroscopic fat containing right adrenal adenoma, for which no  further follow-up or characterization is required. Simple, benign bilateral parapelvic renal cysts, for which no further follow-up or characterization is required. Kidneys are otherwise normal, without obvious renal calculi, solid lesion, or hydronephrosis. Stomach/Bowel: Stomach is within normal limits. No evidence of bowel wall thickening, distention, or inflammatory changes. Vascular/Lymphatic: Aortic atherosclerosis. No enlarged abdominal lymph nodes. Other: No abdominal wall hernia or abnormality. No ascites. Musculoskeletal: No acute or significant osseous findings. IMPRESSION: 1. Status post cholecystectomy postoperative pneumobilia. Common bile duct is dilated up to 1.0 cm, however tapers smoothly to the ampulla. 2. Suspect a small calculus dependently within the central common  bile duct near the ampulla measuring no greater than 0.4 cm. Aortic Atherosclerosis (ICD10-I70.0). Electronically Signed   By: Jearld Lesch M.D.   On: 09/14/2023 07:29   MR ABDOMEN MRCP W WO CONTAST  Result Date: 09/14/2023 CLINICAL DATA:  Right upper quadrant abdominal pain EXAM: MRI ABDOMEN WITHOUT AND WITH CONTRAST (INCLUDING MRCP) TECHNIQUE: Multiplanar multisequence MR imaging of the abdomen was performed both before and after the administration of intravenous contrast. Heavily T2-weighted images of the biliary and pancreatic ducts were obtained, and three-dimensional MRCP images were rendered by post processing. CONTRAST:  9mL GADAVIST GADOBUTROL 1 MMOL/ML IV SOLN COMPARISON:  Abdominal ultrasound, 09/13/2023, CT abdomen pelvis, 09/13/2023 FINDINGS: Lower chest: No acute abnormality. Hepatobiliary: No focal liver abnormality is seen. Status post cholecystectomy. Common bile duct is dilated up to 1.0 cm, however tapers smoothly to the ampulla. Postoperative pneumobilia. Suspect a small calculus dependently within the central common bile duct near the ampulla measuring no greater than 0.4 cm (series 4, image 25). Pancreas:  Unremarkable. No pancreatic ductal dilatation or surrounding inflammatory changes. Spleen: Normal in size without significant abnormality. Adrenals/Urinary Tract: Small, definitively benign macroscopic fat containing right adrenal adenoma, for which no further follow-up or characterization is required. Simple, benign bilateral parapelvic renal cysts, for which no further follow-up or characterization is required. Kidneys are otherwise normal, without obvious renal calculi, solid lesion, or hydronephrosis. Stomach/Bowel: Stomach is within normal limits. No evidence of bowel wall thickening, distention, or inflammatory changes. Vascular/Lymphatic: Aortic atherosclerosis. No enlarged abdominal lymph nodes. Other: No abdominal wall hernia or abnormality. No ascites. Musculoskeletal: No acute or significant osseous findings. IMPRESSION: 1. Status post cholecystectomy postoperative pneumobilia. Common bile duct is dilated up to 1.0 cm, however tapers smoothly to the ampulla. 2. Suspect a small calculus dependently within the central common bile duct near the ampulla measuring no greater than 0.4 cm. Aortic Atherosclerosis (ICD10-I70.0). Electronically Signed   By: Jearld Lesch M.D.   On: 09/14/2023 07:29   US Abdomen Limited RUQ (LIVER/GB)  Result Date: 09/13/2023 CLINICAL DATA:  Right upper quadrant pain, prior cholecystectomy EXAM: ULTRASOUND ABDOMEN LIMITED RIGHT UPPER QUADRANT COMPARISON:  09/13/2023 FINDINGS: Gallbladder: Cholecystectomy Common bile duct: Diameter: 3 mm Liver: Increased liver echotexture consistent with hepatic steatosis. No focal liver abnormality. No intrahepatic biliary duct dilation. Portal vein is patent on color Doppler imaging with normal direction of blood flow towards the liver. Other: None. IMPRESSION: 1. Increased liver echotexture consistent with hepatic steatosis. 2. No evidence of biliary duct dilation. Electronically Signed   By: Sharlet Salina M.D.   On: 09/13/2023 22:40   CT  ABDOMEN PELVIS W CONTRAST  Result Date: 09/13/2023 CLINICAL DATA:  Acute, non localized abdominal pain. EXAM: CT ABDOMEN AND PELVIS WITH CONTRAST TECHNIQUE: Multidetector CT imaging of the abdomen and pelvis was performed using the standard protocol following bolus administration of intravenous contrast. RADIATION DOSE REDUCTION: This exam was performed according to the departmental dose-optimization program which includes automated exposure control, adjustment of the mA and/or kV according to patient size and/or use of iterative reconstruction technique. CONTRAST:  OMNIPAQUE IOHEXOL 300 MG/ML  SOLN COMPARISON:  Abdomen and pelvis CT report dated 08/12/2012. FINDINGS: Lower chest: Unremarkable. Hepatobiliary: Mild diffuse low-density of the liver. Cholecystectomy clips. Pancreas: Unremarkable. No pancreatic ductal dilatation or surrounding inflammatory changes. Spleen: Normal in size without focal abnormality. Adrenals/Urinary Tract: 1.1 cm relatively low-density right adrenal nodule. Normal-appearing left adrenal gland. 2 mm upper pole left renal calculus and 2 mm mid right renal calculus. No hydronephrosis.  Unremarkable ureters and urinary bladder. Stomach/Bowel: Multiple colonic diverticula without evidence of diverticulitis. Unremarkable appendix, small bowel and stomach. Vascular/Lymphatic: No significant vascular findings are present. No enlarged abdominal or pelvic lymph nodes. Reproductive: Mildly enlarged prostate gland. Other: Small bilateral proximal inguinal hernias containing fat. 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 IMPRESSION: 1. No acute abnormality. 2. Mild hepatic steatosis. 3. 1.1 cm right adrenal probable adenoma. 4. Bilateral nonobstructing renal calculi. 5. Colonic diverticulosis. 6. Mildly enlarged prostate gland. 7. Small bilateral proximal inguinal hernias containing fat. Electronically Signed   By: Beckie Salts M.D.   On:  09/13/2023 20:58   CT Head Wo Contrast  Result Date: 09/13/2023 CLINICAL DATA:  Altered mental status EXAM: CT HEAD WITHOUT CONTRAST TECHNIQUE: Contiguous axial images were obtained from the base of the skull through the vertex without intravenous contrast. RADIATION DOSE REDUCTION: This exam was performed according to the departmental dose-optimization program which includes automated exposure control, adjustment of the mA and/or kV according to patient size and/or use of iterative reconstruction technique. COMPARISON:  03/19/2021 FINDINGS: Brain: No mass, hemorrhage or extra-axial collection. There is hypoattenuation of the white matter. CSF spaces are normal. Vascular: No hyperdense vessel or unexpected calcification. Skull: Normal Sinuses/Orbits: Paranasal sinuses are clear. No mastoid effusion. Normal orbits. Ocular lens replacements and right ocular valve. Other: None IMPRESSION: 1. No acute intracranial abnormality. 2. Findings of chronic small vessel ischemia. Electronically Signed   By: Deatra Robinson M.D.   On: 09/13/2023 20:28   DG Chest Port 1 View  Result Date: 09/13/2023 CLINICAL DATA:  Sepsis EXAM: PORTABLE CHEST 1 VIEW COMPARISON:  Chest x-ray 09/17/2015 FINDINGS: The heart size and mediastinal contours are within normal limits. Underinflation. No consolidation, pneumothorax or effusion. No edema. The visualized skeletal structures are unremarkable. Overlapping cardiac leads. Film is under penetrated. Degenerative changes of the spine IMPRESSION: Underinflation.  No acute cardiopulmonary disease. Electronically Signed   By: Karen Kays M.D.   On: 09/13/2023 19:43    ROS negative except above Blood pressure (!) 117/59, pulse 84, temperature 100 F (37.8 C), resp. rate 18, height 5\' 10"  (1.778 m), weight 89.8 kg, SpO2 96%. Physical Exam vital signs stable low-grade temp no acute distress in good spirits exam please see preassessment evaluation but pertinent for his abdomen being soft  nontender ultrasound CT and MRI reviewed labs reviewed pertinent for slightly elevated white count BUN and creatinine okay liver tests increased but decreased today compared to yesterday hemoglobin platelets INR okay Assessment/Plan: Probable resolving cholangitis and CBD stone Plan: The risk benefits methods and success rate of ERCP and stone extraction was discussed with the patient and we will proceed today with anesthesia assistance with further workup and plans pending those findings  Jerianne Anselmo E 09/14/2023, 10:03 AM

## 2023-09-14 NOTE — Progress Notes (Signed)
[facility]                                                                              Gilbert Rangel, is a 77 y.o. male, DOB - 1946-04-05, QMV:[id number] Admit date - 09/13/2023    Outpatient Primary MD for the patient is Ignatius Specking, MD  LOS - 1  days  Chief Complaint  Patient presents with   Altered Mental Status       Brief summary   Patient is a 77 year old male with CKD stage IIIa, GERD, history of cholecystectomy, spinal stenosis status post lumbar decompression L3-4 L4-5 (07/12/2023) presented with fever, right upper quadrant abdominal pain and confusion. Patient reported that he was in his usual state of health on the morning of admission, after taking a shower he developed significant fatigue and chills with RUQ abdominal pain.  Nausea but no emesis.  His family felt that he seemed confused and disoriented.  His fever was 102 F by EMS  In ED, BP 111/80, pulse 109, RR 22, temp 99.1 F.  WBCs 14.3, hemoglobin 17.4, creatinine 1.3.  AST 585, ALT 367, alk phos 213, total bilirubin 4.0. Lipase 40.  CT head negative.  CT abdomen pelvis negative for acute abnormality, mild hepatic steatosis and cholecystectomy clips.  RUQ abdominal ultrasound showed hepatic steatosis, no no evidence of biliary duct dilation.  Patient was started on broad-spectrum IV antibiotics and GI was consulted.  Assessment & Plan    Principal Problem: Sepsis (HCC) with transaminitis, POA Acute cholangitis -Patient met sepsis criteria with leukocytosis, tachycardia, fevers, transaminitis, ?  Cholangitis -Procalcitonin 29.61.  Acute hepatitis panel negative -MRCP showed CBD dilated up to 1 cm, tapers smoothly to the ampulla, suspect a small calculus within the central common bile duct near the ampulla -GI consulted, plan ERCP today -N.p.o., antiemetics, pain control, IVF -Continue IV Rocephin and Flagyl, follow blood culture sensitivities   Active Problems: Klebsiella bacteremia -Likely  due to #1, BC ID positive for Klebsiella, follow sensitivities -Repeat blood cultures -Currently on IV Rocephin and Flagyl     Chronic kidney disease, stage 3a (HCC) -Presented with creatinine of 1.35, creatinine was 1.31 on 07/03/2023 -Received IV fluid hydration, creatinine improving to 1.2    Hypokalemia -Replaced  Anxiety -Continue Xanax as needed  Estimated body mass index is 28.41 kg/m as calculated from the following:   Height as of this encounter: 5\' 10"  (1.778 m).   Weight as of this encounter: 89.8 kg.  Code Status:  DVT Prophylaxis:  heparin injection 5,000 Units Start: 09/14/23 0600   Level of Care: Level of care: Med-Surg Family Communication: Updated patient Disposition Plan:      Remains inpatient appropriate:      Procedures:  MRCP  Consultants:   Gastroenterology  Antimicrobials:   Anti-infectives (From admission, onward)    Start     Dose/Rate Route Frequency Ordered Stop   09/14/23 1400  [MAR Hold]  cefTRIAXone (ROCEPHIN) 2 g in sodium chloride 0.9 % 100 mL IVPB        (MAR Hold since Fri 09/14/2023 at 1021.Hold Reason: Transfer to a Procedural area)   2 g  200 mL/hr over 30 Minutes Intravenous Every 24 hours 09/14/23 0905     09/14/23 1000  [MAR Hold]  metroNIDAZOLE (FLAGYL) IVPB 500 mg        (MAR Hold since Fri 09/14/2023 at 1021.Hold Reason: Transfer to a Procedural area)   500 mg 100 mL/hr over 60 Minutes Intravenous Every 12 hours 09/14/23 0905     09/14/23 0600  piperacillin-tazobactam (ZOSYN) IVPB 3.375 g  Status:  Discontinued        3.375 g 12.5 mL/hr over 240 Minutes Intravenous Every 8 hours 09/13/23 2324 09/14/23 0905   09/13/23 1730  ceFEPIme (MAXIPIME) 2 g in sodium chloride 0.9 % 100 mL IVPB        2 g 200 mL/hr over 30 Minutes Intravenous  Once 09/13/23 1716 09/13/23 1816   09/13/23 1730  metroNIDAZOLE (FLAGYL) IVPB 500 mg        500 mg 100 mL/hr over 60 Minutes Intravenous  Once 09/13/23 1716 09/13/23 1844   09/13/23 1730   vancomycin (VANCOCIN) IVPB 1000 mg/200 mL premix        1,000 mg 200 mL/hr over 60 Minutes Intravenous  Once 09/13/23 1716 09/13/23 1852          Medications  [MAR Hold] heparin  5,000 Units Subcutaneous Q8H   [MAR Hold] potassium chloride  40 mEq Oral Once      Subjective:   Gilbert Rangel was seen and examined today.  Still low-grade fevers, 100 F this morning, abdominal pain is improving, no nausea or vomiting.  Patient denies dizziness, chest pain, shortness of breath.  Objective:   Vitals:   09/14/23 0000 09/14/23 0136 09/14/23 0547 09/14/23 1029  BP: 95/60 (!) 151/81 (!) 117/59 (!) 167/80  Pulse: 76 84 84 80  Resp: 13 18 18 17   Temp: 98 F (36.7 C) 98 F (36.7 C) 100 F (37.8 C) 98.1 F (36.7 C)  TempSrc: Oral   Tympanic  SpO2: 97% 98% 96% 95%  Weight:      Height:        Intake/Output Summary (Last 24 hours) at 09/14/2023 1042 Last data filed at 09/14/2023 0903 Gross per 24 hour  Intake 813.99 ml  Output 250 ml  Net 563.99 ml     Wt Readings from Last 3 Encounters:  09/13/23 89.8 kg  07/12/23 89.8 kg  07/03/23 89.2 kg     Exam General: Alert and oriented x 3, NAD Cardiovascular: S1 S2 auscultated,  RRR Respiratory: Clear to auscultation bilaterally, no wheezing Gastrointestinal: Soft, nontender, nondistended, + bowel sounds Ext: no pedal edema bilaterally Neuro: no new deficits Psych: Normal affect     Data Reviewed:  I have personally reviewed following labs    CBC Lab Results  Component Value Date   WBC 12.8 (H) 09/14/2023   RBC 5.00 09/14/2023   HGB 14.6 09/14/2023   HCT 43.9 09/14/2023   MCV 87.8 09/14/2023   MCH 29.2 09/14/2023   PLT 169 09/14/2023   MCHC 33.3 09/14/2023   RDW 13.5 09/14/2023   LYMPHSABS 0.5 (L) 09/13/2023   MONOABS 0.9 09/13/2023   EOSABS 0.0 09/13/2023   BASOSABS 0.0 09/13/2023     Last metabolic panel Lab Results  Component Value Date   NA 133 (L) 09/14/2023   K 3.6 09/14/2023   CL 102 09/14/2023    CO2 23 09/14/2023   BUN 13 09/14/2023   CREATININE 1.26 (H) 09/14/2023   GLUCOSE 108 (H) 09/14/2023   GFRNONAA 59 (L) 09/14/2023   GFRAA >  60 09/17/2015   CALCIUM 8.2 (L) 09/14/2023   PROT 5.9 (L) 09/14/2023   ALBUMIN 3.2 (L) 09/14/2023   BILITOT 3.7 (H) 09/14/2023   ALKPHOS 163 (H) 09/14/2023   AST 205 (H) 09/14/2023   ALT 246 (H) 09/14/2023   ANIONGAP 8 09/14/2023    CBG (last 3)  No results for input(s): "GLUCAP" in the last 72 hours.    Coagulation Profile: Recent Labs  Lab 09/14/23 0449  INR 1.2     Radiology Studies: I have personally reviewed the imaging studies  MR 3D Recon At Scanner  Result Date: 09/14/2023 CLINICAL DATA:  Right upper quadrant abdominal pain EXAM: MRI ABDOMEN WITHOUT AND WITH CONTRAST (INCLUDING MRCP) TECHNIQUE: Multiplanar multisequence MR imaging of the abdomen was performed both before and after the administration of intravenous contrast. Heavily T2-weighted images of the biliary and pancreatic ducts were obtained, and three-dimensional MRCP images were rendered by post processing. CONTRAST:  9mL GADAVIST GADOBUTROL 1 MMOL/ML IV SOLN COMPARISON:  Abdominal ultrasound, 09/13/2023, CT abdomen pelvis, 09/13/2023 FINDINGS: Lower chest: No acute abnormality. Hepatobiliary: No focal liver abnormality is seen. Status post cholecystectomy. Common bile duct is dilated up to 1.0 cm, however tapers smoothly to the ampulla. Postoperative pneumobilia. Suspect a small calculus dependently within the central common bile duct near the ampulla measuring no greater than 0.4 cm (series 4, image 25). Pancreas: Unremarkable. No pancreatic ductal dilatation or surrounding inflammatory changes. Spleen: Normal in size without significant abnormality. Adrenals/Urinary Tract: Small, definitively benign macroscopic fat containing right adrenal adenoma, for which no further follow-up or characterization is required. Simple, benign bilateral parapelvic renal cysts, for which no  further follow-up or characterization is required. Kidneys are otherwise normal, without obvious renal calculi, solid lesion, or hydronephrosis. Stomach/Bowel: Stomach is within normal limits. No evidence of bowel wall thickening, distention, or inflammatory changes. Vascular/Lymphatic: Aortic atherosclerosis. No enlarged abdominal lymph nodes. Other: No abdominal wall hernia or abnormality. No ascites. Musculoskeletal: No acute or significant osseous findings. IMPRESSION: 1. Status post cholecystectomy postoperative pneumobilia. Common bile duct is dilated up to 1.0 cm, however tapers smoothly to the ampulla. 2. Suspect a small calculus dependently within the central common bile duct near the ampulla measuring no greater than 0.4 cm. Aortic Atherosclerosis (ICD10-I70.0). Electronically Signed   By: Jearld Lesch M.D.   On: 09/14/2023 07:29   MR ABDOMEN MRCP W WO CONTAST  Result Date: 09/14/2023 CLINICAL DATA:  Right upper quadrant abdominal pain EXAM: MRI ABDOMEN WITHOUT AND WITH CONTRAST (INCLUDING MRCP) TECHNIQUE: Multiplanar multisequence MR imaging of the abdomen was performed both before and after the administration of intravenous contrast. Heavily T2-weighted images of the biliary and pancreatic ducts were obtained, and three-dimensional MRCP images were rendered by post processing. CONTRAST:  9mL GADAVIST GADOBUTROL 1 MMOL/ML IV SOLN COMPARISON:  Abdominal ultrasound, 09/13/2023, CT abdomen pelvis, 09/13/2023 FINDINGS: Lower chest: No acute abnormality. Hepatobiliary: No focal liver abnormality is seen. Status post cholecystectomy. Common bile duct is dilated up to 1.0 cm, however tapers smoothly to the ampulla. Postoperative pneumobilia. Suspect a small calculus dependently within the central common bile duct near the ampulla measuring no greater than 0.4 cm (series 4, image 25). Pancreas: Unremarkable. No pancreatic ductal dilatation or surrounding inflammatory changes. Spleen: Normal in size without  significant abnormality. Adrenals/Urinary Tract: Small, definitively benign macroscopic fat containing right adrenal adenoma, for which no further follow-up or characterization is required. Simple, benign bilateral parapelvic renal cysts, for which no further follow-up or characterization is required. Kidneys are otherwise normal, without  obvious renal calculi, solid lesion, or hydronephrosis. Stomach/Bowel: Stomach is within normal limits. No evidence of bowel wall thickening, distention, or inflammatory changes. Vascular/Lymphatic: Aortic atherosclerosis. No enlarged abdominal lymph nodes. Other: No abdominal wall hernia or abnormality. No ascites. Musculoskeletal: No acute or significant osseous findings. IMPRESSION: 1. Status post cholecystectomy postoperative pneumobilia. Common bile duct is dilated up to 1.0 cm, however tapers smoothly to the ampulla. 2. Suspect a small calculus dependently within the central common bile duct near the ampulla measuring no greater than 0.4 cm. Aortic Atherosclerosis (ICD10-I70.0). Electronically Signed   By: Jearld Lesch M.D.   On: 09/14/2023 07:29   US Abdomen Limited RUQ (LIVER/GB)  Result Date: 09/13/2023 CLINICAL DATA:  Right upper quadrant pain, prior cholecystectomy EXAM: ULTRASOUND ABDOMEN LIMITED RIGHT UPPER QUADRANT COMPARISON:  09/13/2023 FINDINGS: Gallbladder: Cholecystectomy Common bile duct: Diameter: 3 mm Liver: Increased liver echotexture consistent with hepatic steatosis. No focal liver abnormality. No intrahepatic biliary duct dilation. Portal vein is patent on color Doppler imaging with normal direction of blood flow towards the liver. Other: None. IMPRESSION: 1. Increased liver echotexture consistent with hepatic steatosis. 2. No evidence of biliary duct dilation. Electronically Signed   By: Sharlet Salina M.D.   On: 09/13/2023 22:40   CT ABDOMEN PELVIS W CONTRAST  Result Date: 09/13/2023 CLINICAL DATA:  Acute, non localized abdominal pain. EXAM: CT  ABDOMEN AND PELVIS WITH CONTRAST TECHNIQUE: Multidetector CT imaging of the abdomen and pelvis was performed using the standard protocol following bolus administration of intravenous contrast. RADIATION DOSE REDUCTION: This exam was performed according to the departmental dose-optimization program which includes automated exposure control, adjustment of the mA and/or kV according to patient size and/or use of iterative reconstruction technique. CONTRAST:  OMNIPAQUE IOHEXOL 300 MG/ML  SOLN COMPARISON:  Abdomen and pelvis CT report dated 08/12/2012. FINDINGS: Lower chest: Unremarkable. Hepatobiliary: Mild diffuse low-density of the liver. Cholecystectomy clips. Pancreas: Unremarkable. No pancreatic ductal dilatation or surrounding inflammatory changes. Spleen: Normal in size without focal abnormality. Adrenals/Urinary Tract: 1.1 cm relatively low-density right adrenal nodule. Normal-appearing left adrenal gland. 2 mm upper pole left renal calculus and 2 mm mid right renal calculus. No hydronephrosis. Unremarkable ureters and urinary bladder. Stomach/Bowel: Multiple colonic diverticula without evidence of diverticulitis. Unremarkable appendix, small bowel and stomach. Vascular/Lymphatic: No significant vascular findings are present. No enlarged abdominal or pelvic lymph nodes. Reproductive: Mildly enlarged prostate gland. Other: Small bilateral proximal inguinal hernias containing fat. 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 IMPRESSION: 1. No acute abnormality. 2. Mild hepatic steatosis. 3. 1.1 cm right adrenal probable adenoma. 4. Bilateral nonobstructing renal calculi. 5. Colonic diverticulosis. 6. Mildly enlarged prostate gland. 7. Small bilateral proximal inguinal hernias containing fat. Electronically Signed   By: Beckie Salts M.D.   On: 09/13/2023 20:58   CT Head Wo Contrast  Result Date: 09/13/2023 CLINICAL DATA:  Altered mental status EXAM: CT HEAD  WITHOUT CONTRAST TECHNIQUE: Contiguous axial images were obtained from the base of the skull through the vertex without intravenous contrast. RADIATION DOSE REDUCTION: This exam was performed according to the departmental dose-optimization program which includes automated exposure control, adjustment of the mA and/or kV according to patient size and/or use of iterative reconstruction technique. COMPARISON:  03/19/2021 FINDINGS: Brain: No mass, hemorrhage or extra-axial collection. There is hypoattenuation of the white matter. CSF spaces are normal. Vascular: No hyperdense vessel or unexpected calcification. Skull: Normal Sinuses/Orbits: Paranasal sinuses are clear. No mastoid effusion. Normal orbits. Ocular lens replacements and right  ocular valve. Other: None IMPRESSION: 1. No acute intracranial abnormality. 2. Findings of chronic small vessel ischemia. Electronically Signed   By: Deatra Robinson M.D.   On: 09/13/2023 20:28   DG Chest Port 1 View  Result Date: 09/13/2023 CLINICAL DATA:  Sepsis EXAM: PORTABLE CHEST 1 VIEW COMPARISON:  Chest x-ray 09/17/2015 FINDINGS: The heart size and mediastinal contours are within normal limits. Underinflation. No consolidation, pneumothorax or effusion. No edema. The visualized skeletal structures are unremarkable. Overlapping cardiac leads. Film is under penetrated. Degenerative changes of the spine IMPRESSION: Underinflation.  No acute cardiopulmonary disease. Electronically Signed   By: Karen Kays M.D.   On: 09/13/2023 19:43       Zanna Hawn M.D. [facility] 09/14/2023, 10:42 AM  Available via Epic secure chat 7am-7pm After 7 pm, please refer to night coverage provider listed on amion.

## 2023-09-14 NOTE — Progress Notes (Signed)
PHARMACY - PHYSICIAN COMMUNICATION CRITICAL VALUE ALERT - BLOOD CULTURE IDENTIFICATION (BCID)  Gilbert Rangel is an 77 y.o. male who presented to [facility] on 09/13/2023 with a chief complaint of RUQ pain, fever, and confusion.   Assessment:  Bcx 2/4 bottles growing klebsiella pneumoniae, presumed source intra-abdominal.    Name of physician (or Provider) Contacted: Dr. Isidoro Donning via secure chat   Current antibiotics: Zosyn 3.375 g   Changes to prescribed antibiotics recommended:  - Zosyn changed to ceftriaxone 2 g q24h plus flagyl 500 mg BID   Results for orders placed or performed during the hospital encounter of 09/13/23  Blood Culture ID Panel (Reflexed) (Collected: 09/13/2023  5:29 PM)  Result Value Ref Range   Enterococcus faecalis NOT DETECTED NOT DETECTED   Enterococcus Faecium NOT DETECTED NOT DETECTED   Listeria monocytogenes NOT DETECTED NOT DETECTED   Staphylococcus species NOT DETECTED NOT DETECTED   Staphylococcus aureus (BCID) NOT DETECTED NOT DETECTED   Staphylococcus epidermidis NOT DETECTED NOT DETECTED   Staphylococcus lugdunensis NOT DETECTED NOT DETECTED   Streptococcus species NOT DETECTED NOT DETECTED   Streptococcus agalactiae NOT DETECTED NOT DETECTED   Streptococcus pneumoniae NOT DETECTED NOT DETECTED   Streptococcus pyogenes NOT DETECTED NOT DETECTED   A.calcoaceticus-baumannii NOT DETECTED NOT DETECTED   Bacteroides fragilis NOT DETECTED NOT DETECTED   Enterobacterales DETECTED (A) NOT DETECTED   Enterobacter cloacae complex NOT DETECTED NOT DETECTED   Escherichia coli NOT DETECTED NOT DETECTED   Klebsiella aerogenes NOT DETECTED NOT DETECTED   Klebsiella oxytoca NOT DETECTED NOT DETECTED   Klebsiella pneumoniae DETECTED (A) NOT DETECTED   Proteus species NOT DETECTED NOT DETECTED   Salmonella species NOT DETECTED NOT DETECTED   Serratia marcescens NOT DETECTED NOT DETECTED   Haemophilus influenzae NOT DETECTED NOT DETECTED   Neisseria meningitidis NOT  DETECTED NOT DETECTED   Pseudomonas aeruginosa NOT DETECTED NOT DETECTED   Stenotrophomonas maltophilia NOT DETECTED NOT DETECTED   Candida albicans NOT DETECTED NOT DETECTED   Candida auris NOT DETECTED NOT DETECTED   Candida glabrata NOT DETECTED NOT DETECTED   Candida krusei NOT DETECTED NOT DETECTED   Candida parapsilosis NOT DETECTED NOT DETECTED   Candida tropicalis NOT DETECTED NOT DETECTED   Cryptococcus neoformans/gattii NOT DETECTED NOT DETECTED   CTX-M ESBL NOT DETECTED NOT DETECTED   Carbapenem resistance IMP NOT DETECTED NOT DETECTED   Carbapenem resistance KPC NOT DETECTED NOT DETECTED   Carbapenem resistance NDM NOT DETECTED NOT DETECTED   Carbapenem resist OXA 48 LIKE NOT DETECTED NOT DETECTED   Carbapenem resistance VIM NOT DETECTED NOT DETECTED    Karlton Lemon, PharmD Candidate  09/14/2023  9:08 AM

## 2023-09-14 NOTE — Progress Notes (Signed)
Mobility Specialist - Progress Note   09/14/23 0944  Mobility  Activity Ambulated with assistance in hallway  Level of Assistance Modified independent, requires aide device or extra time  Assistive Device Other (Comment) (IV Pole)  Distance Ambulated (ft) 500 ft  Activity Response Tolerated well  Mobility Referral Yes  $Mobility charge 1 Mobility  Mobility Specialist Start Time (ACUTE ONLY) 0844  Mobility Specialist Stop Time (ACUTE ONLY) 0855  Mobility Specialist Time Calculation (min) (ACUTE ONLY) 11 min   Pt received in bed and agreeable to mobility. No complaints during session. Pt to bed after session with all needs met.    [facility]

## 2023-09-14 NOTE — Plan of Care (Signed)
  Problem: Clinical Measurements: Goal: Diagnostic test results will improve Outcome: Progressing Goal: Signs and symptoms of infection will decrease Outcome: Progressing   Problem: Respiratory: Goal: Ability to maintain adequate ventilation will improve Outcome: Progressing   Problem: Health Behavior/Discharge Planning: Goal: Ability to manage health-related needs will improve Outcome: Progressing   Problem: Clinical Measurements: Goal: Ability to maintain clinical measurements within normal limits will improve Outcome: Progressing Goal: Diagnostic test results will improve Outcome: Progressing

## 2023-09-15 DIAGNOSIS — A498 Other bacterial infections of unspecified site: Secondary | ICD-10-CM | POA: Diagnosis not present

## 2023-09-15 DIAGNOSIS — K8031 Calculus of bile duct with cholangitis, unspecified, with obstruction: Secondary | ICD-10-CM

## 2023-09-15 DIAGNOSIS — R1084 Generalized abdominal pain: Secondary | ICD-10-CM | POA: Diagnosis not present

## 2023-09-15 DIAGNOSIS — R7401 Elevation of levels of liver transaminase levels: Secondary | ICD-10-CM | POA: Diagnosis not present

## 2023-09-15 DIAGNOSIS — K8050 Calculus of bile duct without cholangitis or cholecystitis without obstruction: Secondary | ICD-10-CM | POA: Diagnosis not present

## 2023-09-15 DIAGNOSIS — R4182 Altered mental status, unspecified: Secondary | ICD-10-CM | POA: Diagnosis not present

## 2023-09-15 LAB — COMPREHENSIVE METABOLIC PANEL
ALT: 147 U/L — ABNORMAL HIGH (ref 0–44)
AST: 78 U/L — ABNORMAL HIGH (ref 15–41)
Albumin: 3.1 g/dL — ABNORMAL LOW (ref 3.5–5.0)
Alkaline Phosphatase: 154 U/L — ABNORMAL HIGH (ref 38–126)
Anion gap: 7 (ref 5–15)
BUN: 18 mg/dL (ref 8–23)
CO2: 25 mmol/L (ref 22–32)
Calcium: 8.3 mg/dL — ABNORMAL LOW (ref 8.9–10.3)
Chloride: 101 mmol/L (ref 98–111)
Creatinine, Ser: 1.38 mg/dL — ABNORMAL HIGH (ref 0.61–1.24)
GFR, Estimated: 53 mL/min — ABNORMAL LOW (ref >60)
Glucose, Bld: 148 mg/dL — ABNORMAL HIGH (ref 70–99)
Potassium: 4.1 mmol/L (ref 3.5–5.1)
Sodium: 133 mmol/L — ABNORMAL LOW (ref 135–145)
Total Bilirubin: 5 mg/dL — ABNORMAL HIGH (ref <1.2)
Total Protein: 5.8 g/dL — ABNORMAL LOW (ref 6.5–8.1)

## 2023-09-15 LAB — CBC
HCT: 42.5 % (ref 39.0–52.0)
Hemoglobin: 14.4 g/dL (ref 13.0–17.0)
MCH: 29.9 pg (ref 26.0–34.0)
MCHC: 33.9 g/dL (ref 30.0–36.0)
MCV: 88.2 fL (ref 80.0–100.0)
Platelets: 161 10*3/uL (ref 150–400)
RBC: 4.82 MIL/uL (ref 4.22–5.81)
RDW: 13.6 % (ref 11.5–15.5)
WBC: 9 10*3/uL (ref 4.0–10.5)
nRBC: 0 % (ref 0.0–0.2)

## 2023-09-15 LAB — PROCALCITONIN: Procalcitonin: 20.54 ng/mL

## 2023-09-15 NOTE — Progress Notes (Signed)
[facility] Progress Note  SUBJECTIVE:   Interval history: Gilbert Rangel was seen and evaluated today at bedside. Resting comfortably in bed. Denied abdominal pain. No nausea or vomiting, tolerated breakfast well. Denied having bowel movement over past 5 days.   Past Medical History:  Diagnosis Date   GERD (gastroesophageal reflux disease)    Hypothyroidism    Macular degeneration    Pneumonia    Thyroid disease    Past Surgical History:  Procedure Laterality Date   CHOLECYSTECTOMY     ELBOW SURGERY Right    EYE SURGERY Bilateral    HEMI-MICRODISCECTOMY LUMBAR LAMINECTOMY LEVEL 1 Bilateral 07/12/2023   Procedure: MICROLUMBAR DECOMPRESSION LUMBAR FOUR-LUMBAR FIVE, POSSIBLE LUMBAR THREE-LUMBAR FOUR;  Surgeon: Jene Every, MD;  Location: [facility] OR;  Service: Orthopedics;  Laterality: Bilateral;   HERNIA REPAIR     KNEE ARTHROSCOPY W/ MENISCAL REPAIR     LUMBAR LAMINECTOMY/DECOMPRESSION MICRODISCECTOMY N/A 09/29/2015   Procedure: MICRO LUMBER DECOMPRESSION L5-S1 AND L4-5 ON RIGHT;  Surgeon: Jene Every, MD;  Location: [facility];  Service: Orthopedics;  Laterality: N/A;   REPAIR / REINSERT BICEPS TENDON AT ELBOW     ROTATOR CUFF REPAIR     stomach     TONSILLECTOMY     Current Facility-Administered Medications  Medication Dose Route Frequency Provider Last Rate Last Admin   ALPRAZolam (XANAX) tablet 0.5 mg  0.5 mg Oral QHS PRN Vida Rigger, MD       cefTRIAXone (ROCEPHIN) 2 g in sodium chloride 0.9 % 100 mL IVPB  2 g Intravenous Q24H Vida Rigger, MD   Stopped at 09/14/23 1430   heparin injection 5,000 Units  5,000 Units Subcutaneous Q8H Vida Rigger, MD   5,000 Units at 09/15/23 0540   HYDROmorphone (DILAUDID) injection 0.5 mg  0.5 mg Intravenous Q3H PRN Vida Rigger, MD       metroNIDAZOLE (FLAGYL) IVPB 500 mg  500 mg Intravenous Q12H Vida Rigger, MD 100 mL/hr at 09/14/23 2155 500 mg at 09/14/23 2155   ondansetron (ZOFRAN) tablet 4 mg  4 mg Oral Q6H PRN Vida Rigger, MD       Or    ondansetron [facility]) injection 4 mg  4 mg Intravenous Q6H PRN Vida Rigger, MD       potassium chloride (KLOR-CON) packet 40 mEq  40 mEq Oral Once Vida Rigger, MD       senna-docusate (Senokot-S) tablet 1 tablet  1 tablet Oral QHS PRN Vida Rigger, MD       Allergies as of 09/13/2023 - Review Complete 09/13/2023  Allergen Reaction Noted   Apraclonidine  09/12/2018   Brimonidine  09/03/2018   Netarsudil  09/03/2018   Methazolamide Dermatitis 12/30/2020   Review of Systems:  Review of Systems  Gastrointestinal:  Negative for abdominal pain, nausea and vomiting.    OBJECTIVE:   Temp:  [97.5 F (36.4 C)-98.4 F (36.9 C)] 97.9 F (36.6 C) (11/09 0524) Pulse Rate:  [65-81] 66 (11/09 0524) Resp:  [10-18] 18 (11/09 0524) BP: (144-167)/(72-95) 162/86 (11/09 0524) SpO2:  [94 %-100 %] 98 % (11/09 0524) Last BM Date : 09/11/23 Physical Exam Constitutional:      General: He is not in acute distress.    Appearance: He is not ill-appearing, toxic-appearing or diaphoretic.  Cardiovascular:     Rate and Rhythm: Normal rate and regular rhythm.  Pulmonary:     Effort: No respiratory distress.     Breath sounds: Normal breath sounds.  Abdominal:     General: Bowel sounds  are normal. There is no distension.     Palpations: Abdomen is soft.     Tenderness: There is no abdominal tenderness. There is no guarding.  Neurological:     Mental Status: He is alert.     Labs: Recent Labs    09/13/23 1729 09/14/23 0449 09/15/23 0518  WBC 14.3* 12.8* 9.0  HGB 17.4* 14.6 14.4  HCT 51.6 43.9 42.5  PLT 197 169 161   BMET Recent Labs    09/13/23 1729 09/14/23 0449 09/15/23 0518  NA 137 133* 133*  K 3.3* 3.6 4.1  CL 101 102 101  CO2 21* 23 25  GLUCOSE 140* 108* 148*  BUN 16 13 18   CREATININE 1.35* 1.26* 1.38*  CALCIUM 9.1 8.2* 8.3*   LFT Recent Labs    09/15/23 0518  PROT 5.8*  ALBUMIN 3.1*  AST 78*  ALT 147*  ALKPHOS 154*  BILITOT 5.0*   PT/INR Recent Labs     09/14/23 0449  LABPROT 15.6*  INR 1.2   Diagnostic imaging: DG ERCP  Result Date: 09/15/2023 CLINICAL DATA:  Choledocholithiasis EXAM: ERCP TECHNIQUE: Multiple spot images obtained with the fluoroscopic device and submitted for interpretation post-procedure. FLUOROSCOPY: Radiation Exposure Index (as provided by the fluoroscopic device): 39.93 mGy Kerma COMPARISON:  MRCP 09/14/2023 FINDINGS: A total of 10 intraoperative saved images are submitted for review. The images demonstrate a flexible duodenal scope in the descending duodenum followed by wire cannulation of the common bile duct. Subsequent images demonstrate sphincterotomy and balloon sphincterotomy. There is biliary ductal dilatation. Clips are present at the cystic duct consistent with prior cholecystectomy. IMPRESSION: ERCP with sphincterotomy as above. These images were submitted for radiologic interpretation only. Please see the procedural report for the amount of contrast and the fluoroscopy time utilized. Electronically Signed   By: Malachy Moan M.D.   On: 09/15/2023 07:05   DG C-Arm 1-60 Min-No Report  Result Date: 09/14/2023 Fluoroscopy was utilized by the requesting physician.  No radiographic interpretation.   MR 3D Recon At Scanner  Result Date: 09/14/2023 CLINICAL DATA:  Right upper quadrant abdominal pain EXAM: MRI ABDOMEN WITHOUT AND WITH CONTRAST (INCLUDING MRCP) TECHNIQUE: Multiplanar multisequence MR imaging of the abdomen was performed both before and after the administration of intravenous contrast. Heavily T2-weighted images of the biliary and pancreatic ducts were obtained, and three-dimensional MRCP images were rendered by post processing. CONTRAST:  9mL GADAVIST GADOBUTROL 1 MMOL/ML IV SOLN COMPARISON:  Abdominal ultrasound, 09/13/2023, CT abdomen pelvis, 09/13/2023 FINDINGS: Lower chest: No acute abnormality. Hepatobiliary: No focal liver abnormality is seen. Status post cholecystectomy. Common bile duct is dilated  up to 1.0 cm, however tapers smoothly to the ampulla. Postoperative pneumobilia. Suspect a small calculus dependently within the central common bile duct near the ampulla measuring no greater than 0.4 cm (series 4, image 25). Pancreas: Unremarkable. No pancreatic ductal dilatation or surrounding inflammatory changes. Spleen: Normal in size without significant abnormality. Adrenals/Urinary Tract: Small, definitively benign macroscopic fat containing right adrenal adenoma, for which no further follow-up or characterization is required. Simple, benign bilateral parapelvic renal cysts, for which no further follow-up or characterization is required. Kidneys are otherwise normal, without obvious renal calculi, solid lesion, or hydronephrosis. Stomach/Bowel: Stomach is within normal limits. No evidence of bowel wall thickening, distention, or inflammatory changes. Vascular/Lymphatic: Aortic atherosclerosis. No enlarged abdominal lymph nodes. Other: No abdominal wall hernia or abnormality. No ascites. Musculoskeletal: No acute or significant osseous findings. IMPRESSION: 1. Status post cholecystectomy postoperative pneumobilia. Common bile duct is dilated  up to 1.0 cm, however tapers smoothly to the ampulla. 2. Suspect a small calculus dependently within the central common bile duct near the ampulla measuring no greater than 0.4 cm. Aortic Atherosclerosis (ICD10-I70.0). Electronically Signed   By: Jearld Lesch M.D.   On: 09/14/2023 07:29   MR ABDOMEN MRCP W WO CONTAST  Result Date: 09/14/2023 CLINICAL DATA:  Right upper quadrant abdominal pain EXAM: MRI ABDOMEN WITHOUT AND WITH CONTRAST (INCLUDING MRCP) TECHNIQUE: Multiplanar multisequence MR imaging of the abdomen was performed both before and after the administration of intravenous contrast. Heavily T2-weighted images of the biliary and pancreatic ducts were obtained, and three-dimensional MRCP images were rendered by post processing. CONTRAST:  9mL GADAVIST  GADOBUTROL 1 MMOL/ML IV SOLN COMPARISON:  Abdominal ultrasound, 09/13/2023, CT abdomen pelvis, 09/13/2023 FINDINGS: Lower chest: No acute abnormality. Hepatobiliary: No focal liver abnormality is seen. Status post cholecystectomy. Common bile duct is dilated up to 1.0 cm, however tapers smoothly to the ampulla. Postoperative pneumobilia. Suspect a small calculus dependently within the central common bile duct near the ampulla measuring no greater than 0.4 cm (series 4, image 25). Pancreas: Unremarkable. No pancreatic ductal dilatation or surrounding inflammatory changes. Spleen: Normal in size without significant abnormality. Adrenals/Urinary Tract: Small, definitively benign macroscopic fat containing right adrenal adenoma, for which no further follow-up or characterization is required. Simple, benign bilateral parapelvic renal cysts, for which no further follow-up or characterization is required. Kidneys are otherwise normal, without obvious renal calculi, solid lesion, or hydronephrosis. Stomach/Bowel: Stomach is within normal limits. No evidence of bowel wall thickening, distention, or inflammatory changes. Vascular/Lymphatic: Aortic atherosclerosis. No enlarged abdominal lymph nodes. Other: No abdominal wall hernia or abnormality. No ascites. Musculoskeletal: No acute or significant osseous findings. IMPRESSION: 1. Status post cholecystectomy postoperative pneumobilia. Common bile duct is dilated up to 1.0 cm, however tapers smoothly to the ampulla. 2. Suspect a small calculus dependently within the central common bile duct near the ampulla measuring no greater than 0.4 cm. Aortic Atherosclerosis (ICD10-I70.0). Electronically Signed   By: Jearld Lesch M.D.   On: 09/14/2023 07:29   US Abdomen Limited RUQ (LIVER/GB)  Result Date: 09/13/2023 CLINICAL DATA:  Right upper quadrant pain, prior cholecystectomy EXAM: ULTRASOUND ABDOMEN LIMITED RIGHT UPPER QUADRANT COMPARISON:  09/13/2023 FINDINGS: Gallbladder:  Cholecystectomy Common bile duct: Diameter: 3 mm Liver: Increased liver echotexture consistent with hepatic steatosis. No focal liver abnormality. No intrahepatic biliary duct dilation. Portal vein is patent on color Doppler imaging with normal direction of blood flow towards the liver. Other: None. IMPRESSION: 1. Increased liver echotexture consistent with hepatic steatosis. 2. No evidence of biliary duct dilation. Electronically Signed   By: Sharlet Salina M.D.   On: 09/13/2023 22:40   CT ABDOMEN PELVIS W CONTRAST  Result Date: 09/13/2023 CLINICAL DATA:  Acute, non localized abdominal pain. EXAM: CT ABDOMEN AND PELVIS WITH CONTRAST TECHNIQUE: Multidetector CT imaging of the abdomen and pelvis was performed using the standard protocol following bolus administration of intravenous contrast. RADIATION DOSE REDUCTION: This exam was performed according to the departmental dose-optimization program which includes automated exposure control, adjustment of the mA and/or kV according to patient size and/or use of iterative reconstruction technique. CONTRAST:  OMNIPAQUE IOHEXOL 300 MG/ML  SOLN COMPARISON:  Abdomen and pelvis CT report dated 08/12/2012. FINDINGS: Lower chest: Unremarkable. Hepatobiliary: Mild diffuse low-density of the liver. Cholecystectomy clips. Pancreas: Unremarkable. No pancreatic ductal dilatation or surrounding inflammatory changes. Spleen: Normal in size without focal abnormality. Adrenals/Urinary Tract: 1.1 cm relatively low-density right adrenal nodule.  Normal-appearing left adrenal gland. 2 mm upper pole left renal calculus and 2 mm mid right renal calculus. No hydronephrosis. Unremarkable ureters and urinary bladder. Stomach/Bowel: Multiple colonic diverticula without evidence of diverticulitis. Unremarkable appendix, small bowel and stomach. Vascular/Lymphatic: No significant vascular findings are present. No enlarged abdominal or pelvic lymph nodes. Reproductive: Mildly enlarged  prostate gland. Other: Small bilateral proximal inguinal hernias containing fat. 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 IMPRESSION: 1. No acute abnormality. 2. Mild hepatic steatosis. 3. 1.1 cm right adrenal probable adenoma. 4. Bilateral nonobstructing renal calculi. 5. Colonic diverticulosis. 6. Mildly enlarged prostate gland. 7. Small bilateral proximal inguinal hernias containing fat. Electronically Signed   By: Beckie Salts M.D.   On: 09/13/2023 20:58   CT Head Wo Contrast  Result Date: 09/13/2023 CLINICAL DATA:  Altered mental status EXAM: CT HEAD WITHOUT CONTRAST TECHNIQUE: Contiguous axial images were obtained from the base of the skull through the vertex without intravenous contrast. RADIATION DOSE REDUCTION: This exam was performed according to the departmental dose-optimization program which includes automated exposure control, adjustment of the mA and/or kV according to patient size and/or use of iterative reconstruction technique. COMPARISON:  03/19/2021 FINDINGS: Brain: No mass, hemorrhage or extra-axial collection. There is hypoattenuation of the white matter. CSF spaces are normal. Vascular: No hyperdense vessel or unexpected calcification. Skull: Normal Sinuses/Orbits: Paranasal sinuses are clear. No mastoid effusion. Normal orbits. Ocular lens replacements and right ocular valve. Other: None IMPRESSION: 1. No acute intracranial abnormality. 2. Findings of chronic small vessel ischemia. Electronically Signed   By: Deatra Robinson M.D.   On: 09/13/2023 20:28   DG Chest Port 1 View  Result Date: 09/13/2023 CLINICAL DATA:  Sepsis EXAM: PORTABLE CHEST 1 VIEW COMPARISON:  Chest x-ray 09/17/2015 FINDINGS: The heart size and mediastinal contours are within normal limits. Underinflation. No consolidation, pneumothorax or effusion. No edema. The visualized skeletal structures are unremarkable. Overlapping cardiac leads. Film is under penetrated.  Degenerative changes of the spine IMPRESSION: Underinflation.  No acute cardiopulmonary disease. Electronically Signed   By: Karen Kays M.D.   On: 09/13/2023 19:43    IMPRESSION: Choledocholithiasis s/p ERCP 09/14/23 with biliary sphincterotomy and balloon extraction Transaminase elevation in mixed pattern Post-cholecystectomy GERD Spinal stenosis  PLAN: -Trend liver enzyme panel tomorrow, if bilirubin downtrends, ok to discharge from GI standpoint -Your IM care re antibiotic therapy  -Diet as tolerated -Ok to use Miralax PRN for recent constipation -[facility] will follow   LOS: 2 days   Liliane Shi, [facility]

## 2023-09-15 NOTE — Progress Notes (Signed)
Mobility Specialist - Progress Note   09/15/23 1113  Mobility  Activity Ambulated independently in hallway  Level of Assistance Independent  Assistive Device None  Distance Ambulated (ft) 500 ft  Activity Response Tolerated well  Mobility Referral Yes  $Mobility charge 1 Mobility  Mobility Specialist Start Time (ACUTE ONLY) [id number]  Mobility Specialist Stop Time (ACUTE ONLY) 0931  Mobility Specialist Time Calculation (min) (ACUTE ONLY) 8 min   Pt received in bed and agreeable to mobility. No complaints during session. Pt to bed after session with all needs met.    [facility]

## 2023-09-15 NOTE — Plan of Care (Signed)
  Problem: Education: Goal: Knowledge of General Education information will improve Description Including pain rating scale, medication(s)/side effects and non-pharmacologic comfort measures Outcome: Progressing   Problem: Health Behavior/Discharge Planning: Goal: Ability to manage health-related needs will improve Outcome: Progressing   

## 2023-09-15 NOTE — Progress Notes (Signed)
[facility]                                                                              Gilbert Rangel, is a 77 y.o. male, DOB - Oct 22, 1946, VQQ:[id number] Admit date - 09/13/2023    Outpatient Primary MD for the patient is Ignatius Specking, MD  LOS - 2  days  Chief Complaint  Patient presents with   Altered Mental Status       Brief summary   Patient is a 77 year old male with CKD stage IIIa, GERD, history of cholecystectomy, spinal stenosis status post lumbar decompression L3-4 L4-5 (07/12/2023) presented with fever, right upper quadrant abdominal pain and confusion. Patient reported that he was in his usual state of health on the morning of admission, after taking a shower he developed significant fatigue and chills with RUQ abdominal pain.  Nausea but no emesis.  His family felt that he seemed confused and disoriented.  His fever was 102 F by EMS  In ED, BP 111/80, pulse 109, RR 22, temp 99.1 F.  WBCs 14.3, hemoglobin 17.4, creatinine 1.3.  AST 585, ALT 367, alk phos 213, total bilirubin 4.0. Lipase 40.  CT head negative.  CT abdomen pelvis negative for acute abnormality, mild hepatic steatosis and cholecystectomy clips.  RUQ abdominal ultrasound showed hepatic steatosis, no no evidence of biliary duct dilation.  Patient was started on broad-spectrum IV antibiotics and GI was consulted.  Assessment & Plan    Principal Problem: Sepsis (HCC) with transaminitis, POA Acute cholangitis -Patient met sepsis criteria with leukocytosis, tachycardia, fevers, transaminitis, ?  Cholangitis -Procalcitonin 29.61.  Acute hepatitis panel negative -MRCP showed CBD dilated up to 1 cm, tapers smoothly to the ampulla, suspect a small calculus within the central common bile duct near the ampulla -Status post ERCP with biliary sphincterotomy and balloon extraction -GI following, recommended continue to trend LFTs, if bilirubin trending down, okay to discharge from GI standpoint -LFTs  improving however total bilirubin trended up 5.0 <3.7 yesterday   Active Problems: Klebsiella bacteremia -Likely due to #1, BC ID positive for Klebsiella.  Procalcitonin improving. -Repeat blood cultures so far negative -Currently on IV Rocephin and Flagyl -Await sensitivities     Chronic kidney disease, stage 3a (HCC) -Presented with creatinine of 1.35, creatinine was 1.31 on 07/03/2023 -Received IV fluid hydration, and 1.3 appears to be at baseline    Hypokalemia -Replace as needed  Anxiety -Continue Xanax as needed  Estimated body mass index is 28.41 kg/m as calculated from the following:   Height as of this encounter: 5\' 10"  (1.778 m).   Weight as of this encounter: 89.8 kg.  Code Status:  DVT Prophylaxis:  heparin injection 5,000 Units Start: 09/14/23 0600   Level of Care: Level of care: Med-Surg Family Communication: Updated patient Disposition Plan:      Remains inpatient appropriate:   Awaiting sensitivities, plan to discharge home tomorrow if bilirubin trending down   Procedures:  MRCP  Consultants:   Gastroenterology  Antimicrobials:   Anti-infectives (From admission, onward)    Start     Dose/Rate Route Frequency Ordered Stop   09/14/23 1400  cefTRIAXone (  ROCEPHIN) 2 g in sodium chloride 0.9 % 100 mL IVPB        2 g 200 mL/hr over 30 Minutes Intravenous Every 24 hours 09/14/23 0905     09/14/23 1000  metroNIDAZOLE (FLAGYL) IVPB 500 mg        500 mg 100 mL/hr over 60 Minutes Intravenous Every 12 hours 09/14/23 0905     09/14/23 0600  piperacillin-tazobactam (ZOSYN) IVPB 3.375 g  Status:  Discontinued        3.375 g 12.5 mL/hr over 240 Minutes Intravenous Every 8 hours 09/13/23 2324 09/14/23 0905   09/13/23 1730  ceFEPIme (MAXIPIME) 2 g in sodium chloride 0.9 % 100 mL IVPB        2 g 200 mL/hr over 30 Minutes Intravenous  Once 09/13/23 1716 09/13/23 1816   09/13/23 1730  metroNIDAZOLE (FLAGYL) IVPB 500 mg        500 mg 100 mL/hr over 60 Minutes  Intravenous  Once 09/13/23 1716 09/13/23 1844   09/13/23 1730  vancomycin (VANCOCIN) IVPB 1000 mg/200 mL premix        1,000 mg 200 mL/hr over 60 Minutes Intravenous  Once 09/13/23 1716 09/13/23 1852          Medications  heparin  5,000 Units Subcutaneous Q8H   potassium chloride  40 mEq Oral Once      Subjective:   Gilbert Rangel was seen and examined today.  No fevers, feeling better.  No nausea vomiting, abdominal pain.   Objective:   Vitals:   09/14/23 1330 09/14/23 1348 09/14/23 2124 09/15/23 0524  BP: (!) 149/74 (!) 144/87 (!) 149/95 (!) 162/86  Pulse: 69 73 65 66  Resp: 17 18 16 18   Temp:  97.6 F (36.4 C) (!) 97.5 F (36.4 C) 97.9 F (36.6 C)  TempSrc:  Oral Oral Oral  SpO2: 94% 95% 95% 98%  Weight:      Height:        Intake/Output Summary (Last 24 hours) at 09/15/2023 1249 Last data filed at 09/15/2023 7829 Gross per 24 hour  Intake 1878.88 ml  Output --  Net 1878.88 ml     Wt Readings from Last 3 Encounters:  09/13/23 89.8 kg  07/12/23 89.8 kg  07/03/23 89.2 kg    Physical Exam General: Alert and oriented x 3, NAD Cardiovascular: S1 S2 clear, RRR.  Respiratory: CTAB, no wheezing, rales or rhonchi Gastrointestinal: Soft, NT, ND, NBS Ext: no pedal edema bilaterally Neuro: no new deficits Psych: Normal affect    Data Reviewed:  I have personally reviewed following labs    CBC Lab Results  Component Value Date   WBC 9.0 09/15/2023   RBC 4.82 09/15/2023   HGB 14.4 09/15/2023   HCT 42.5 09/15/2023   MCV 88.2 09/15/2023   MCH 29.9 09/15/2023   PLT 161 09/15/2023   MCHC 33.9 09/15/2023   RDW 13.6 09/15/2023   LYMPHSABS 0.5 (L) 09/13/2023   MONOABS 0.9 09/13/2023   EOSABS 0.0 09/13/2023   BASOSABS 0.0 09/13/2023     Last metabolic panel Lab Results  Component Value Date   NA 133 (L) 09/15/2023   K 4.1 09/15/2023   CL 101 09/15/2023   CO2 25 09/15/2023   BUN 18 09/15/2023   CREATININE 1.38 (H) 09/15/2023   GLUCOSE 148 (H)  09/15/2023   GFRNONAA 53 (L) 09/15/2023   GFRAA >60 09/17/2015   CALCIUM 8.3 (L) 09/15/2023   PROT 5.8 (L) 09/15/2023   ALBUMIN 3.1 (L) 09/15/2023  BILITOT 5.0 (H) 09/15/2023   ALKPHOS 154 (H) 09/15/2023   AST 78 (H) 09/15/2023   ALT 147 (H) 09/15/2023   ANIONGAP 7 09/15/2023    CBG (last 3)  No results for input(s): "GLUCAP" in the last 72 hours.    Coagulation Profile: Recent Labs  Lab 09/14/23 0449  INR 1.2     Radiology Studies: I have personally reviewed the imaging studies  DG ERCP  Result Date: 09/15/2023 CLINICAL DATA:  Choledocholithiasis EXAM: ERCP TECHNIQUE: Multiple spot images obtained with the fluoroscopic device and submitted for interpretation post-procedure. FLUOROSCOPY: Radiation Exposure Index (as provided by the fluoroscopic device): 39.93 mGy Kerma COMPARISON:  MRCP 09/14/2023 FINDINGS: A total of 10 intraoperative saved images are submitted for review. The images demonstrate a flexible duodenal scope in the descending duodenum followed by wire cannulation of the common bile duct. Subsequent images demonstrate sphincterotomy and balloon sphincterotomy. There is biliary ductal dilatation. Clips are present at the cystic duct consistent with prior cholecystectomy. IMPRESSION: ERCP with sphincterotomy as above. These images were submitted for radiologic interpretation only. Please see the procedural report for the amount of contrast and the fluoroscopy time utilized. Electronically Signed   By: Malachy Moan M.D.   On: 09/15/2023 07:05   DG C-Arm 1-60 Min-No Report  Result Date: 09/14/2023 Fluoroscopy was utilized by the requesting physician.  No radiographic interpretation.   MR 3D Recon At Scanner  Result Date: 09/14/2023 CLINICAL DATA:  Right upper quadrant abdominal pain EXAM: MRI ABDOMEN WITHOUT AND WITH CONTRAST (INCLUDING MRCP) TECHNIQUE: Multiplanar multisequence MR imaging of the abdomen was performed both before and after the administration of  intravenous contrast. Heavily T2-weighted images of the biliary and pancreatic ducts were obtained, and three-dimensional MRCP images were rendered by post processing. CONTRAST:  9mL GADAVIST GADOBUTROL 1 MMOL/ML IV SOLN COMPARISON:  Abdominal ultrasound, 09/13/2023, CT abdomen pelvis, 09/13/2023 FINDINGS: Lower chest: No acute abnormality. Hepatobiliary: No focal liver abnormality is seen. Status post cholecystectomy. Common bile duct is dilated up to 1.0 cm, however tapers smoothly to the ampulla. Postoperative pneumobilia. Suspect a small calculus dependently within the central common bile duct near the ampulla measuring no greater than 0.4 cm (series 4, image 25). Pancreas: Unremarkable. No pancreatic ductal dilatation or surrounding inflammatory changes. Spleen: Normal in size without significant abnormality. Adrenals/Urinary Tract: Small, definitively benign macroscopic fat containing right adrenal adenoma, for which no further follow-up or characterization is required. Simple, benign bilateral parapelvic renal cysts, for which no further follow-up or characterization is required. Kidneys are otherwise normal, without obvious renal calculi, solid lesion, or hydronephrosis. Stomach/Bowel: Stomach is within normal limits. No evidence of bowel wall thickening, distention, or inflammatory changes. Vascular/Lymphatic: Aortic atherosclerosis. No enlarged abdominal lymph nodes. Other: No abdominal wall hernia or abnormality. No ascites. Musculoskeletal: No acute or significant osseous findings. IMPRESSION: 1. Status post cholecystectomy postoperative pneumobilia. Common bile duct is dilated up to 1.0 cm, however tapers smoothly to the ampulla. 2. Suspect a small calculus dependently within the central common bile duct near the ampulla measuring no greater than 0.4 cm. Aortic Atherosclerosis (ICD10-I70.0). Electronically Signed   By: Jearld Lesch M.D.   On: 09/14/2023 07:29   MR ABDOMEN MRCP W WO CONTAST  Result  Date: 09/14/2023 CLINICAL DATA:  Right upper quadrant abdominal pain EXAM: MRI ABDOMEN WITHOUT AND WITH CONTRAST (INCLUDING MRCP) TECHNIQUE: Multiplanar multisequence MR imaging of the abdomen was performed both before and after the administration of intravenous contrast. Heavily T2-weighted images of the biliary and pancreatic ducts  were obtained, and three-dimensional MRCP images were rendered by post processing. CONTRAST:  9mL GADAVIST GADOBUTROL 1 MMOL/ML IV SOLN COMPARISON:  Abdominal ultrasound, 09/13/2023, CT abdomen pelvis, 09/13/2023 FINDINGS: Lower chest: No acute abnormality. Hepatobiliary: No focal liver abnormality is seen. Status post cholecystectomy. Common bile duct is dilated up to 1.0 cm, however tapers smoothly to the ampulla. Postoperative pneumobilia. Suspect a small calculus dependently within the central common bile duct near the ampulla measuring no greater than 0.4 cm (series 4, image 25). Pancreas: Unremarkable. No pancreatic ductal dilatation or surrounding inflammatory changes. Spleen: Normal in size without significant abnormality. Adrenals/Urinary Tract: Small, definitively benign macroscopic fat containing right adrenal adenoma, for which no further follow-up or characterization is required. Simple, benign bilateral parapelvic renal cysts, for which no further follow-up or characterization is required. Kidneys are otherwise normal, without obvious renal calculi, solid lesion, or hydronephrosis. Stomach/Bowel: Stomach is within normal limits. No evidence of bowel wall thickening, distention, or inflammatory changes. Vascular/Lymphatic: Aortic atherosclerosis. No enlarged abdominal lymph nodes. Other: No abdominal wall hernia or abnormality. No ascites. Musculoskeletal: No acute or significant osseous findings. IMPRESSION: 1. Status post cholecystectomy postoperative pneumobilia. Common bile duct is dilated up to 1.0 cm, however tapers smoothly to the ampulla. 2. Suspect a small calculus  dependently within the central common bile duct near the ampulla measuring no greater than 0.4 cm. Aortic Atherosclerosis (ICD10-I70.0). Electronically Signed   By: Jearld Lesch M.D.   On: 09/14/2023 07:29   US Abdomen Limited RUQ (LIVER/GB)  Result Date: 09/13/2023 CLINICAL DATA:  Right upper quadrant pain, prior cholecystectomy EXAM: ULTRASOUND ABDOMEN LIMITED RIGHT UPPER QUADRANT COMPARISON:  09/13/2023 FINDINGS: Gallbladder: Cholecystectomy Common bile duct: Diameter: 3 mm Liver: Increased liver echotexture consistent with hepatic steatosis. No focal liver abnormality. No intrahepatic biliary duct dilation. Portal vein is patent on color Doppler imaging with normal direction of blood flow towards the liver. Other: None. IMPRESSION: 1. Increased liver echotexture consistent with hepatic steatosis. 2. No evidence of biliary duct dilation. Electronically Signed   By: Sharlet Salina M.D.   On: 09/13/2023 22:40   CT ABDOMEN PELVIS W CONTRAST  Result Date: 09/13/2023 CLINICAL DATA:  Acute, non localized abdominal pain. EXAM: CT ABDOMEN AND PELVIS WITH CONTRAST TECHNIQUE: Multidetector CT imaging of the abdomen and pelvis was performed using the standard protocol following bolus administration of intravenous contrast. RADIATION DOSE REDUCTION: This exam was performed according to the departmental dose-optimization program which includes automated exposure control, adjustment of the mA and/or kV according to patient size and/or use of iterative reconstruction technique. CONTRAST:  OMNIPAQUE IOHEXOL 300 MG/ML  SOLN COMPARISON:  Abdomen and pelvis CT report dated 08/12/2012. FINDINGS: Lower chest: Unremarkable. Hepatobiliary: Mild diffuse low-density of the liver. Cholecystectomy clips. Pancreas: Unremarkable. No pancreatic ductal dilatation or surrounding inflammatory changes. Spleen: Normal in size without focal abnormality. Adrenals/Urinary Tract: 1.1 cm relatively low-density right adrenal nodule.  Normal-appearing left adrenal gland. 2 mm upper pole left renal calculus and 2 mm mid right renal calculus. No hydronephrosis. Unremarkable ureters and urinary bladder. Stomach/Bowel: Multiple colonic diverticula without evidence of diverticulitis. Unremarkable appendix, small bowel and stomach. Vascular/Lymphatic: No significant vascular findings are present. No enlarged abdominal or pelvic lymph nodes. Reproductive: Mildly enlarged prostate gland. Other: Small bilateral proximal inguinal hernias containing fat. 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 IMPRESSION: 1. No acute abnormality. 2. Mild hepatic steatosis. 3. 1.1 cm right adrenal probable adenoma. 4. Bilateral nonobstructing renal calculi. 5. Colonic diverticulosis. 6. Mildly enlarged  prostate gland. 7. Small bilateral proximal inguinal hernias containing fat. Electronically Signed   By: Beckie Salts M.D.   On: 09/13/2023 20:58   CT Head Wo Contrast  Result Date: 09/13/2023 CLINICAL DATA:  Altered mental status EXAM: CT HEAD WITHOUT CONTRAST TECHNIQUE: Contiguous axial images were obtained from the base of the skull through the vertex without intravenous contrast. RADIATION DOSE REDUCTION: This exam was performed according to the departmental dose-optimization program which includes automated exposure control, adjustment of the mA and/or kV according to patient size and/or use of iterative reconstruction technique. COMPARISON:  03/19/2021 FINDINGS: Brain: No mass, hemorrhage or extra-axial collection. There is hypoattenuation of the white matter. CSF spaces are normal. Vascular: No hyperdense vessel or unexpected calcification. Skull: Normal Sinuses/Orbits: Paranasal sinuses are clear. No mastoid effusion. Normal orbits. Ocular lens replacements and right ocular valve. Other: None IMPRESSION: 1. No acute intracranial abnormality. 2. Findings of chronic small vessel ischemia. Electronically Signed   By:  Deatra Robinson M.D.   On: 09/13/2023 20:28   DG Chest Port 1 View  Result Date: 09/13/2023 CLINICAL DATA:  Sepsis EXAM: PORTABLE CHEST 1 VIEW COMPARISON:  Chest x-ray 09/17/2015 FINDINGS: The heart size and mediastinal contours are within normal limits. Underinflation. No consolidation, pneumothorax or effusion. No edema. The visualized skeletal structures are unremarkable. Overlapping cardiac leads. Film is under penetrated. Degenerative changes of the spine IMPRESSION: Underinflation.  No acute cardiopulmonary disease. Electronically Signed   By: Karen Kays M.D.   On: 09/13/2023 19:43       Jaxxen Voong M.D. [facility] 09/15/2023, 12:49 PM  Available via Epic secure chat 7am-7pm After 7 pm, please refer to night coverage provider listed on amion.

## 2023-09-15 NOTE — Plan of Care (Signed)
  Problem: Fluid Volume: Goal: Hemodynamic stability will improve Outcome: Progressing   Problem: Clinical Measurements: Goal: Diagnostic test results will improve Outcome: Progressing   Problem: Education: Goal: Knowledge of General Education information will improve Description: Including pain rating scale, medication(s)/side effects and non-pharmacologic comfort measures Outcome: Progressing   Problem: Health Behavior/Discharge Planning: Goal: Ability to manage health-related needs will improve Outcome: Progressing   Problem: Clinical Measurements: Goal: Ability to maintain clinical measurements within normal limits will improve Outcome: Progressing

## 2023-09-16 DIAGNOSIS — R1084 Generalized abdominal pain: Secondary | ICD-10-CM | POA: Diagnosis not present

## 2023-09-16 DIAGNOSIS — R7401 Elevation of levels of liver transaminase levels: Secondary | ICD-10-CM | POA: Diagnosis not present

## 2023-09-16 DIAGNOSIS — K8050 Calculus of bile duct without cholangitis or cholecystitis without obstruction: Secondary | ICD-10-CM | POA: Diagnosis not present

## 2023-09-16 DIAGNOSIS — K8031 Calculus of bile duct with cholangitis, unspecified, with obstruction: Secondary | ICD-10-CM | POA: Diagnosis not present

## 2023-09-16 DIAGNOSIS — R4182 Altered mental status, unspecified: Secondary | ICD-10-CM | POA: Diagnosis not present

## 2023-09-16 LAB — CULTURE, BLOOD (ROUTINE X 2): Special Requests: ADEQUATE

## 2023-09-16 LAB — CBC
HCT: 42.7 % (ref 39.0–52.0)
Hemoglobin: 14 g/dL (ref 13.0–17.0)
MCH: 29.7 pg (ref 26.0–34.0)
MCHC: 32.8 g/dL (ref 30.0–36.0)
MCV: 90.7 fL (ref 80.0–100.0)
Platelets: 168 10*3/uL (ref 150–400)
RBC: 4.71 MIL/uL (ref 4.22–5.81)
RDW: 13.8 % (ref 11.5–15.5)
WBC: 6.1 10*3/uL (ref 4.0–10.5)
nRBC: 0 % (ref 0.0–0.2)

## 2023-09-16 LAB — COMPREHENSIVE METABOLIC PANEL
ALT: 104 U/L — ABNORMAL HIGH (ref 0–44)
AST: 43 U/L — ABNORMAL HIGH (ref 15–41)
Albumin: 3.1 g/dL — ABNORMAL LOW (ref 3.5–5.0)
Alkaline Phosphatase: 172 U/L — ABNORMAL HIGH (ref 38–126)
Anion gap: 7 (ref 5–15)
BUN: 17 mg/dL (ref 8–23)
CO2: 24 mmol/L (ref 22–32)
Calcium: 8.1 mg/dL — ABNORMAL LOW (ref 8.9–10.3)
Chloride: 102 mmol/L (ref 98–111)
Creatinine, Ser: 1.35 mg/dL — ABNORMAL HIGH (ref 0.61–1.24)
GFR, Estimated: 54 mL/min — ABNORMAL LOW (ref >60)
Glucose, Bld: 108 mg/dL — ABNORMAL HIGH (ref 70–99)
Potassium: 3.9 mmol/L (ref 3.5–5.1)
Sodium: 133 mmol/L — ABNORMAL LOW (ref 135–145)
Total Bilirubin: 2.9 mg/dL — ABNORMAL HIGH (ref <1.2)
Total Protein: 5.8 g/dL — ABNORMAL LOW (ref 6.5–8.1)

## 2023-09-16 LAB — PROCALCITONIN: Procalcitonin: 12.33 ng/mL

## 2023-09-16 MED ORDER — PANTOPRAZOLE SODIUM 40 MG PO TBEC: 40.0000 mg | DELAYED_RELEASE_TABLET | Freq: Every day | ORAL | 3 refills | Status: AC

## 2023-09-16 MED ORDER — POLYETHYLENE GLYCOL 3350 17 G PO PACK: 17.0000 g | PACK | Freq: Every day | ORAL | 0 refills | Status: AC | PRN

## 2023-09-16 MED ORDER — CALCIUM CARBONATE ANTACID 500 MG PO CHEW: 1.0000 | CHEWABLE_TABLET | Freq: Three times a day (TID) | ORAL | Status: DC | PRN

## 2023-09-16 MED ORDER — ONDANSETRON HCL 4 MG PO TABS: 4.0000 mg | ORAL_TABLET | Freq: Three times a day (TID) | ORAL | 0 refills | Status: AC | PRN

## 2023-09-16 MED ORDER — CIPROFLOXACIN HCL 500 MG PO TABS: 500.0000 mg | ORAL_TABLET | Freq: Two times a day (BID) | ORAL | 0 refills | Status: AC

## 2023-09-16 NOTE — Plan of Care (Signed)
  Problem: Respiratory: Goal: Ability to maintain adequate ventilation will improve Outcome: Progressing   Problem: Education: Goal: Knowledge of General Education information will improve Description: Including pain rating scale, medication(s)/side effects and non-pharmacologic comfort measures Outcome: Progressing   Problem: Coping: Goal: Level of anxiety will decrease Outcome: Progressing

## 2023-09-16 NOTE — TOC Initial Note (Signed)
Transition of Care [facility]) - Initial/Assessment Note    Patient Details  Name: Gilbert Rangel MRN: [id number] Date of Birth: 04-13-1946  Transition of Care [facility]) CM/SW Contact:    Adrian Prows, RN Phone Number: 09/16/2023, 11:29 AM  Clinical Narrative:                 Spoke w/ pt in room; pt says he lives at home w/ his children; he plans to return at d/c; he identified POC son Jacque [place] [phone]); pt says he has a walking stick; no TOC needs.  Expected Discharge Plan: Home/Self Care Barriers to Discharge: No Barriers Identified   Patient Goals and CMS Choice Patient states their goals for this hospitalization and ongoing recovery are:: home          Expected Discharge Plan and Services   Discharge Planning Services: CM Consult Post Acute Care Choice: NA Living arrangements for the past 2 months: Single Family Home Expected Discharge Date: 09/16/23               DME Arranged: N/A DME Agency: NA       HH Arranged: NA HH Agency: NA        Prior Living Arrangements/Services Living arrangements for the past 2 months: Single Family Home Lives with:: Adult Children Patient language and need for interpreter reviewed:: Yes Do you feel safe going back to the place where you live?: Yes      Need for Family Participation in Patient Care: Yes (Comment) Care giver support system in place?: Yes (comment)   Criminal Activity/Legal Involvement Pertinent to Current Situation/Hospitalization: No - Comment as needed  Activities of Daily Living   ADL Screening (condition at time of admission) Independently performs ADLs?: Yes (appropriate for developmental age) Is the patient deaf or have difficulty hearing?: Yes Does the patient have difficulty seeing, even when wearing glasses/contacts?: Yes Does the patient have difficulty concentrating, remembering, or making decisions?: Yes  Permission Sought/Granted Permission sought to share information with : Case  Manager Permission granted to share information with : Yes, Verbal Permission Granted  Share Information with NAME: Case Manager        Permission granted to share info w Contact Information: Emin Dolloff (son) [phone]  Emotional Assessment Appearance:: Appears stated age Attitude/Demeanor/Rapport: Gracious Affect (typically observed): Accepting Orientation: : Oriented to Self, Oriented to Place, Oriented to  Time, Oriented to Situation Alcohol / Substance Use: Not Applicable Psych Involvement: No (comment)  Admission diagnosis:  Generalized abdominal pain [R10.84] Transaminitis [R74.01] Sepsis (HCC) [A41.9] Altered mental status, unspecified altered mental status type [R41.82] Patient Active Problem List   Diagnosis Date Noted   Choledocholithiasis 09/14/2023   Cholangitis due to bile duct calculus with obstruction 09/14/2023   Sepsis (HCC) 09/13/2023   Chronic kidney disease, stage 3a (HCC) 09/13/2023   Hypokalemia 09/13/2023   Spinal stenosis at L4-L5 level 07/12/2023   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   HNP (herniated nucleus pulposus), lumbar 09/29/2015   Spinal stenosis of lumbar region 09/29/2015   Low back pain radiating to right leg 08/30/2015   Acute encephalopathy 08/30/2015   Hypothyroidism 08/30/2015   URI (upper respiratory infection) 08/30/2015   Acute viral syndrome 08/30/2015   Transaminitis    Right-sided low back pain with right-sided sciatica    Tobacco abuse    EKG abnormality 08/28/2015   CAP (community acquired pneumonia) 08/28/2015   Sepsis due to pneumonia (HCC) 08/28/2015   Altered mental status 08/27/2015   PCP:  Doreen Beam  B, MD Pharmacy:   [facility] [phone] - [place], [place] - [address] [place] [address] Phone: [phone] Fax: [phone]  [id number] - [place], [place] - [address] [place] [address] Phone: [phone] Fax:  [phone]     Social Determinants of Health (SDOH) Social History: SDOH Screenings   Food Insecurity: No Food Insecurity (09/16/2023)  Housing: Low Risk  (09/16/2023)  Transportation Needs: No Transportation Needs (09/16/2023)  Utilities: Not At Risk (09/16/2023)  Tobacco Use: High Risk (09/14/2023)   SDOH Interventions: Food Insecurity Interventions: Intervention Not Indicated, Inpatient TOC Housing Interventions: Intervention Not Indicated, Inpatient TOC Transportation Interventions: Intervention Not Indicated, Inpatient TOC Utilities Interventions: Intervention Not Indicated, Inpatient TOC   Readmission Risk Interventions     No data to display

## 2023-09-16 NOTE — Progress Notes (Signed)
AVS reviewed w. Pt & his son who verbalized an understanding. No other questions at this time. PIV removed as documented. Pt  dressing for d/c to home. Pt to lobby via w/c - home w/ son

## 2023-09-16 NOTE — Progress Notes (Signed)
[facility] Progress Note  SUBJECTIVE:   Interval history: Gilbert Rangel was seen and evaluated today at bedside. Sitting, resting in bed. Denied abdominal pain. Noted having some indigestion last night, though nothing severe. No nausea, vomiting, chest pain, shortness of breath. Has not had bowel movement in roughly 5 days.   Past Medical History:  Diagnosis Date   GERD (gastroesophageal reflux disease)    Hypothyroidism    Macular degeneration    Pneumonia    Thyroid disease    Past Surgical History:  Procedure Laterality Date   CHOLECYSTECTOMY     ELBOW SURGERY Right    EYE SURGERY Bilateral    HEMI-MICRODISCECTOMY LUMBAR LAMINECTOMY LEVEL 1 Bilateral 07/12/2023   Procedure: MICROLUMBAR DECOMPRESSION LUMBAR FOUR-LUMBAR FIVE, POSSIBLE LUMBAR THREE-LUMBAR FOUR;  Surgeon: Jene Every, MD;  Location: [facility] OR;  Service: Orthopedics;  Laterality: Bilateral;   HERNIA REPAIR     KNEE ARTHROSCOPY W/ MENISCAL REPAIR     LUMBAR LAMINECTOMY/DECOMPRESSION MICRODISCECTOMY N/A 09/29/2015   Procedure: MICRO LUMBER DECOMPRESSION L5-S1 AND L4-5 ON RIGHT;  Surgeon: Jene Every, MD;  Location: [facility];  Service: Orthopedics;  Laterality: N/A;   REPAIR / REINSERT BICEPS TENDON AT ELBOW     ROTATOR CUFF REPAIR     stomach     TONSILLECTOMY     Current Facility-Administered Medications  Medication Dose Route Frequency Provider Last Rate Last Admin   ALPRAZolam Prudy Feeler) tablet 0.5 mg  0.5 mg Oral QHS PRN Vida Rigger, MD       calcium carbonate (TUMS - dosed in mg elemental calcium) chewable tablet 200 mg of elemental calcium  1 tablet Oral Q8H PRN Rai, Ripudeep K, MD       cefTRIAXone (ROCEPHIN) 2 g in sodium chloride 0.9 % 100 mL IVPB  2 g Intravenous Q24H Vida Rigger, MD   Stopped at 09/15/23 1625   heparin injection 5,000 Units  5,000 Units Subcutaneous Q8H Magod, Vernia Buff, MD   5,000 Units at 09/16/23 0541   HYDROmorphone (DILAUDID) injection 0.5 mg  0.5 mg Intravenous Q3H PRN Vida Rigger,  MD       metroNIDAZOLE (FLAGYL) IVPB 500 mg  500 mg Intravenous Q12H Vida Rigger, MD   Stopped at 09/15/23 2332   ondansetron (ZOFRAN) tablet 4 mg  4 mg Oral Q6H PRN Vida Rigger, MD       Or   ondansetron [facility]) injection 4 mg  4 mg Intravenous Q6H PRN Vida Rigger, MD       potassium chloride (KLOR-CON) packet 40 mEq  40 mEq Oral Once Vida Rigger, MD       senna-docusate (Senokot-S) tablet 1 tablet  1 tablet Oral QHS PRN Vida Rigger, MD       Allergies as of 09/13/2023 - Review Complete 09/13/2023  Allergen Reaction Noted   Apraclonidine  09/12/2018   Brimonidine  09/03/2018   Netarsudil  09/03/2018   Methazolamide Dermatitis 12/30/2020   Review of Systems:  Review of Systems  Respiratory:  Negative for shortness of breath.   Cardiovascular:  Negative for chest pain.  Gastrointestinal:  Positive for constipation. Negative for abdominal pain, nausea and vomiting.    OBJECTIVE:   Temp:  [97.8 F (36.6 C)-98.2 F (36.8 C)] 98.2 F (36.8 C) (11/10 0644) Pulse Rate:  [52-58] 58 (11/10 0644) Resp:  [18-19] 18 (11/10 0644) BP: (127-159)/(62-84) 127/62 (11/10 0644) SpO2:  [96 %-100 %] 100 % (11/10 0644) Last BM Date : 09/11/23 Physical Exam Constitutional:      General: He  is not in acute distress.    Appearance: He is not ill-appearing, toxic-appearing or diaphoretic.  Cardiovascular:     Rate and Rhythm: Normal rate and regular rhythm.  Pulmonary:     Effort: No respiratory distress.     Breath sounds: Normal breath sounds.  Abdominal:     General: Bowel sounds are normal. There is no distension.     Palpations: Abdomen is soft.     Tenderness: There is no abdominal tenderness. There is no guarding.  Musculoskeletal:     Right lower leg: No edema.     Left lower leg: No edema.  Skin:    General: Skin is warm and dry.  Neurological:     Mental Status: He is alert.     Labs: Recent Labs    09/14/23 0449 09/15/23 0518 09/16/23 0500  WBC 12.8* 9.0 6.1  HGB 14.6  14.4 14.0  HCT 43.9 42.5 42.7  PLT 169 161 168   BMET Recent Labs    09/14/23 0449 09/15/23 0518 09/16/23 0500  NA 133* 133* 133*  K 3.6 4.1 3.9  CL 102 101 102  CO2 23 25 24   GLUCOSE 108* 148* 108*  BUN 13 18 17   CREATININE 1.26* 1.38* 1.35*  CALCIUM 8.2* 8.3* 8.1*   LFT Recent Labs    09/16/23 0500  PROT 5.8*  ALBUMIN 3.1*  AST 43*  ALT 104*  ALKPHOS 172*  BILITOT 2.9*   PT/INR Recent Labs    09/14/23 0449  LABPROT 15.6*  INR 1.2   Diagnostic imaging: DG ERCP  Result Date: 09/15/2023 CLINICAL DATA:  Choledocholithiasis EXAM: ERCP TECHNIQUE: Multiple spot images obtained with the fluoroscopic device and submitted for interpretation post-procedure. FLUOROSCOPY: Radiation Exposure Index (as provided by the fluoroscopic device): 39.93 mGy Kerma COMPARISON:  MRCP 09/14/2023 FINDINGS: A total of 10 intraoperative saved images are submitted for review. The images demonstrate a flexible duodenal scope in the descending duodenum followed by wire cannulation of the common bile duct. Subsequent images demonstrate sphincterotomy and balloon sphincterotomy. There is biliary ductal dilatation. Clips are present at the cystic duct consistent with prior cholecystectomy. IMPRESSION: ERCP with sphincterotomy as above. These images were submitted for radiologic interpretation only. Please see the procedural report for the amount of contrast and the fluoroscopy time utilized. Electronically Signed   By: Malachy Moan M.D.   On: 09/15/2023 07:05   DG C-Arm 1-60 Min-No Report  Result Date: 09/14/2023 Fluoroscopy was utilized by the requesting physician.  No radiographic interpretation.    IMPRESSION: Choledocholithiasis s/p ERCP 09/14/23 with biliary sphincterotomy and balloon extraction Transaminase elevation in mixed pattern Post-cholecystectomy GERD Spinal stenosis  PLAN: -Liver enzyme panel improved today, patient largely asymptomatic, recommend follow up liver enzyme trend  with PCP in outpatient setting -Diet as tolerated -Miralax as needed for constipation -Will place [facility] contact information on chart for discharge -[facility] will sign off   LOS: 3 days   Liliane Shi, [facility]

## 2023-09-16 NOTE — Discharge Summary (Signed)
Physician Discharge Summary   Patient: Gilbert Rangel MRN: [id number] DOB: 1945/11/20  Admit date:     09/13/2023  Discharge date: 09/16/23  Discharge Physician: Thad Ranger, MD    PCP: Ignatius Specking, MD   Recommendations at discharge:   Continue ciprofloxacin 500 mg p.o. twice daily for 7 days Need LFTs at follow-up appointment  Discharge Diagnoses:  Klebsiella bacteremia with sepsis (HCC)   Transaminitis    Chronic kidney disease, stage 3a (HCC)   Hypokalemia   Choledocholithiasis   Cholangitis due to bile duct calculus with obstruction CKD stage IIIa  Hospital Course: Patient is a 77 year old male with CKD stage IIIa, GERD, history of cholecystectomy, spinal stenosis status post lumbar decompression L3-4 L4-5 (07/12/2023) presented with fever, right upper quadrant abdominal pain and confusion. Patient reported that he was in his usual state of health on the morning of admission, after taking a shower he developed significant fatigue and chills with RUQ abdominal pain.  Nausea but no emesis.  His family felt that he seemed confused and disoriented.  His fever was 102 F by EMS   In ED, BP 111/80, pulse 109, RR 22, temp 99.1 F.  WBCs 14.3, hemoglobin 17.4, creatinine 1.3.  AST 585, ALT 367, alk phos 213, total bilirubin 4.0. Lipase 40.  CT head negative.  CT abdomen pelvis negative for acute abnormality, mild hepatic steatosis and cholecystectomy clips.  RUQ abdominal ultrasound showed hepatic steatosis, no no evidence of biliary duct dilation.   Patient was started on broad-spectrum IV antibiotics and GI was consulted   Assessment and Plan:   Sepsis (HCC) with transaminitis, POA Acute cholangitis -Patient met sepsis criteria with leukocytosis, tachycardia, fevers, transaminitis, concern for cholangitis -Procalcitonin 29.61.  Acute hepatitis panel negative -MRCP showed CBD dilated up to 1 cm, tapers smoothly to the ampulla, suspect a small calculus within the central common  bile duct near the ampulla -Status post ERCP with biliary sphincterotomy and balloon extraction -Cleared by GI to discharge home, tolerating regular diet without any difficulty -LFTs improving however total bilirubin improved, 2.9 at discharge Procalcitonin improved to 12.3 at discharge   Klebsiella bacteremia -Blood cultures positive for Klebsiella likely due to cholangitis.   Procalcitonin improving. -Repeat blood cultures so far negative -Patient was placed on IV Rocephin and Flagyl while inpatient -Based on sensitive days, discharged on oral ciprofloxacin 500 mg twice daily for 7 days       Chronic kidney disease, stage 3a (HCC) -Presented with creatinine of 1.35, creatinine was 1.31 on 07/03/2023 -Received IV fluid hydration, and 1.3 appears to be at baseline     Hypokalemia -Replace as needed, potassium 3.9 at discharge   Anxiety -Continue Xanax as needed   Estimated body mass index is 28.41 kg/m as calculated from the following:   Height as of this encounter: 5\' 10"  (1.778 m).   Weight as of this encounter: 89.8 kg.     Pain control - [employer] Controlled Substance Reporting System database was reviewed. and patient was instructed, not to drive, operate heavy machinery, perform activities at heights, swimming or participation in water activities or provide baby-sitting services while on Pain, Sleep and Anxiety Medications; until their outpatient Physician has advised to do so again. Also recommended to not to take more than prescribed Pain, Sleep and Anxiety Medications.  Consultants: GI Procedures performed: ERCP Disposition: Home Diet recommendation:  Discharge Diet Orders (From admission, onward)     Start     Ordered   09/16/23 0000  Diet - low sodium heart healthy        09/16/23 1021            DISCHARGE MEDICATION: Allergies as of 09/16/2023       Reactions   Apraclonidine    Redness    Brimonidine    redness   Netarsudil    redness    Methazolamide Dermatitis        Medication List     TAKE these medications    ALPRAZolam 0.5 MG tablet Commonly known as: XANAX Take 0.5 mg by mouth daily.   augmented betamethasone dipropionate 0.05 % cream Commonly known as: DIPROLENE-AF Apply 1 Application topically daily as needed (rash).   ciprofloxacin 500 MG tablet Commonly known as: CIPRO Take 1 tablet (500 mg total) by mouth 2 (two) times daily for 7 days.   docusate sodium 100 MG capsule Commonly known as: Colace Take 1 capsule (100 mg total) by mouth 2 (two) times daily.   dorzolamide-timolol 2-0.5 % ophthalmic solution Commonly known as: COSOPT Place 1 drop into both eyes 2 (two) times daily.   latanoprost 0.005 % ophthalmic solution Commonly known as: XALATAN Place 1 drop into both eyes at bedtime.   ondansetron 4 MG tablet Commonly known as: ZOFRAN Take 1 tablet (4 mg total) by mouth every 8 (eight) hours as needed for nausea or vomiting.   oxyCODONE 5 MG immediate release tablet Commonly known as: Oxy IR/ROXICODONE Take 1 tablet (5 mg total) by mouth every 4 (four) hours as needed for severe pain.   pantoprazole 40 MG tablet Commonly known as: PROTONIX Take 1 tablet (40 mg total) by mouth daily before breakfast. What changed: when to take this   polyethylene glycol 17 g packet Commonly known as: MiraLax Take 17 g by mouth daily as needed for mild constipation or moderate constipation. Available over-the-counter.   testosterone cypionate 200 MG/ML injection Commonly known as: DEPOTESTOSTERONE CYPIONATE Inject 100 mg into the muscle every Tuesday.   vardenafil 20 MG tablet Commonly known as: LEVITRA Take 20 mg by mouth daily as needed.        Follow-up Information     Vida Rigger, MD. Call in 1 day(s).   Specialty: Gastroenterology Why: Schedule appointment with Dr. Ewing Schlein in office in 4-6 weeks. Contact information: [address] [place] [address] [phone]          Ignatius Specking, MD. Schedule an appointment as soon as possible for a visit in 2 week(s).   Specialty: Internal Medicine Why: for hospital follow-up Contact information: [address] [place] [address] [phone]                Discharge Exam: Ceasar Mons Weights   09/13/23 1733  Weight: 89.8 kg   S: Feels much better today, ready to go home.  No abdominal pain, nausea vomiting or fevers.  BP 127/62 (BP Location: Left Arm)   Pulse (!) 58   Temp 98.2 F (36.8 C)   Resp 18   Ht 5\' 10"  (1.778 m)   Wt 89.8 kg   SpO2 100%   BMI 28.41 kg/m   Physical Exam General: Alert and oriented x 3, NAD Cardiovascular: S1 S2 clear, RRR.  Respiratory: CTAB, no wheezing, rales or rhonchi Gastrointestinal: Soft, nontender, nondistended, NBS Ext: no pedal edema bilaterally Neuro: no new deficits Skin: No rashes Psych: Normal affect and demeanor, alert and oriented x3    Condition at discharge: fair  The results of  significant diagnostics from this hospitalization (including imaging, microbiology, ancillary and laboratory) are listed below for reference.   Imaging Studies: DG ERCP  Result Date: 09/15/2023 CLINICAL DATA:  Choledocholithiasis EXAM: ERCP TECHNIQUE: Multiple spot images obtained with the fluoroscopic device and submitted for interpretation post-procedure. FLUOROSCOPY: Radiation Exposure Index (as provided by the fluoroscopic device): 39.93 mGy Kerma COMPARISON:  MRCP 09/14/2023 FINDINGS: A total of 10 intraoperative saved images are submitted for review. The images demonstrate a flexible duodenal scope in the descending duodenum followed by wire cannulation of the common bile duct. Subsequent images demonstrate sphincterotomy and balloon sphincterotomy. There is biliary ductal dilatation. Clips are present at the cystic duct consistent with prior cholecystectomy. IMPRESSION: ERCP with sphincterotomy as above. These images were submitted for radiologic interpretation only.  Please see the procedural report for the amount of contrast and the fluoroscopy time utilized. Electronically Signed   By: Malachy Moan M.D.   On: 09/15/2023 07:05   DG C-Arm 1-60 Min-No Report  Result Date: 09/14/2023 Fluoroscopy was utilized by the requesting physician.  No radiographic interpretation.   MR 3D Recon At Scanner  Result Date: 09/14/2023 CLINICAL DATA:  Right upper quadrant abdominal pain EXAM: MRI ABDOMEN WITHOUT AND WITH CONTRAST (INCLUDING MRCP) TECHNIQUE: Multiplanar multisequence MR imaging of the abdomen was performed both before and after the administration of intravenous contrast. Heavily T2-weighted images of the biliary and pancreatic ducts were obtained, and three-dimensional MRCP images were rendered by post processing. CONTRAST:  9mL GADAVIST GADOBUTROL 1 MMOL/ML IV SOLN COMPARISON:  Abdominal ultrasound, 09/13/2023, CT abdomen pelvis, 09/13/2023 FINDINGS: Lower chest: No acute abnormality. Hepatobiliary: No focal liver abnormality is seen. Status post cholecystectomy. Common bile duct is dilated up to 1.0 cm, however tapers smoothly to the ampulla. Postoperative pneumobilia. Suspect a small calculus dependently within the central common bile duct near the ampulla measuring no greater than 0.4 cm (series 4, image 25). Pancreas: Unremarkable. No pancreatic ductal dilatation or surrounding inflammatory changes. Spleen: Normal in size without significant abnormality. Adrenals/Urinary Tract: Small, definitively benign macroscopic fat containing right adrenal adenoma, for which no further follow-up or characterization is required. Simple, benign bilateral parapelvic renal cysts, for which no further follow-up or characterization is required. Kidneys are otherwise normal, without obvious renal calculi, solid lesion, or hydronephrosis. Stomach/Bowel: Stomach is within normal limits. No evidence of bowel wall thickening, distention, or inflammatory changes. Vascular/Lymphatic:  Aortic atherosclerosis. No enlarged abdominal lymph nodes. Other: No abdominal wall hernia or abnormality. No ascites. Musculoskeletal: No acute or significant osseous findings. IMPRESSION: 1. Status post cholecystectomy postoperative pneumobilia. Common bile duct is dilated up to 1.0 cm, however tapers smoothly to the ampulla. 2. Suspect a small calculus dependently within the central common bile duct near the ampulla measuring no greater than 0.4 cm. Aortic Atherosclerosis (ICD10-I70.0). Electronically Signed   By: Jearld Lesch M.D.   On: 09/14/2023 07:29   MR ABDOMEN MRCP W WO CONTAST  Result Date: 09/14/2023 CLINICAL DATA:  Right upper quadrant abdominal pain EXAM: MRI ABDOMEN WITHOUT AND WITH CONTRAST (INCLUDING MRCP) TECHNIQUE: Multiplanar multisequence MR imaging of the abdomen was performed both before and after the administration of intravenous contrast. Heavily T2-weighted images of the biliary and pancreatic ducts were obtained, and three-dimensional MRCP images were rendered by post processing. CONTRAST:  9mL GADAVIST GADOBUTROL 1 MMOL/ML IV SOLN COMPARISON:  Abdominal ultrasound, 09/13/2023, CT abdomen pelvis, 09/13/2023 FINDINGS: Lower chest: No acute abnormality. Hepatobiliary: No focal liver abnormality is seen. Status post cholecystectomy. Common bile duct is dilated up to  1.0 cm, however tapers smoothly to the ampulla. Postoperative pneumobilia. Suspect a small calculus dependently within the central common bile duct near the ampulla measuring no greater than 0.4 cm (series 4, image 25). Pancreas: Unremarkable. No pancreatic ductal dilatation or surrounding inflammatory changes. Spleen: Normal in size without significant abnormality. Adrenals/Urinary Tract: Small, definitively benign macroscopic fat containing right adrenal adenoma, for which no further follow-up or characterization is required. Simple, benign bilateral parapelvic renal cysts, for which no further follow-up or characterization  is required. Kidneys are otherwise normal, without obvious renal calculi, solid lesion, or hydronephrosis. Stomach/Bowel: Stomach is within normal limits. No evidence of bowel wall thickening, distention, or inflammatory changes. Vascular/Lymphatic: Aortic atherosclerosis. No enlarged abdominal lymph nodes. Other: No abdominal wall hernia or abnormality. No ascites. Musculoskeletal: No acute or significant osseous findings. IMPRESSION: 1. Status post cholecystectomy postoperative pneumobilia. Common bile duct is dilated up to 1.0 cm, however tapers smoothly to the ampulla. 2. Suspect a small calculus dependently within the central common bile duct near the ampulla measuring no greater than 0.4 cm. Aortic Atherosclerosis (ICD10-I70.0). Electronically Signed   By: Jearld Lesch M.D.   On: 09/14/2023 07:29   US Abdomen Limited RUQ (LIVER/GB)  Result Date: 09/13/2023 CLINICAL DATA:  Right upper quadrant pain, prior cholecystectomy EXAM: ULTRASOUND ABDOMEN LIMITED RIGHT UPPER QUADRANT COMPARISON:  09/13/2023 FINDINGS: Gallbladder: Cholecystectomy Common bile duct: Diameter: 3 mm Liver: Increased liver echotexture consistent with hepatic steatosis. No focal liver abnormality. No intrahepatic biliary duct dilation. Portal vein is patent on color Doppler imaging with normal direction of blood flow towards the liver. Other: None. IMPRESSION: 1. Increased liver echotexture consistent with hepatic steatosis. 2. No evidence of biliary duct dilation. Electronically Signed   By: Sharlet Salina M.D.   On: 09/13/2023 22:40   CT ABDOMEN PELVIS W CONTRAST  Result Date: 09/13/2023 CLINICAL DATA:  Acute, non localized abdominal pain. EXAM: CT ABDOMEN AND PELVIS WITH CONTRAST TECHNIQUE: Multidetector CT imaging of the abdomen and pelvis was performed using the standard protocol following bolus administration of intravenous contrast. RADIATION DOSE REDUCTION: This exam was performed according to the departmental dose-optimization  program which includes automated exposure control, adjustment of the mA and/or kV according to patient size and/or use of iterative reconstruction technique. CONTRAST:  OMNIPAQUE IOHEXOL 300 MG/ML  SOLN COMPARISON:  Abdomen and pelvis CT report dated 08/12/2012. FINDINGS: Lower chest: Unremarkable. Hepatobiliary: Mild diffuse low-density of the liver. Cholecystectomy clips. Pancreas: Unremarkable. No pancreatic ductal dilatation or surrounding inflammatory changes. Spleen: Normal in size without focal abnormality. Adrenals/Urinary Tract: 1.1 cm relatively low-density right adrenal nodule. Normal-appearing left adrenal gland. 2 mm upper pole left renal calculus and 2 mm mid right renal calculus. No hydronephrosis. Unremarkable ureters and urinary bladder. Stomach/Bowel: Multiple colonic diverticula without evidence of diverticulitis. Unremarkable appendix, small bowel and stomach. Vascular/Lymphatic: No significant vascular findings are present. No enlarged abdominal or pelvic lymph nodes. Reproductive: Mildly enlarged prostate gland. Other: Small bilateral proximal inguinal hernias containing fat. 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 IMPRESSION: 1. No acute abnormality. 2. Mild hepatic steatosis. 3. 1.1 cm right adrenal probable adenoma. 4. Bilateral nonobstructing renal calculi. 5. Colonic diverticulosis. 6. Mildly enlarged prostate gland. 7. Small bilateral proximal inguinal hernias containing fat. Electronically Signed   By: Beckie Salts M.D.   On: 09/13/2023 20:58   CT Head Wo Contrast  Result Date: 09/13/2023 CLINICAL DATA:  Altered mental status EXAM: CT HEAD WITHOUT CONTRAST TECHNIQUE: Contiguous axial images were obtained from  the base of the skull through the vertex without intravenous contrast. RADIATION DOSE REDUCTION: This exam was performed according to the departmental dose-optimization program which includes automated exposure control,  adjustment of the mA and/or kV according to patient size and/or use of iterative reconstruction technique. COMPARISON:  03/19/2021 FINDINGS: Brain: No mass, hemorrhage or extra-axial collection. There is hypoattenuation of the white matter. CSF spaces are normal. Vascular: No hyperdense vessel or unexpected calcification. Skull: Normal Sinuses/Orbits: Paranasal sinuses are clear. No mastoid effusion. Normal orbits. Ocular lens replacements and right ocular valve. Other: None IMPRESSION: 1. No acute intracranial abnormality. 2. Findings of chronic small vessel ischemia. Electronically Signed   By: Deatra Robinson M.D.   On: 09/13/2023 20:28   DG Chest Port 1 View  Result Date: 09/13/2023 CLINICAL DATA:  Sepsis EXAM: PORTABLE CHEST 1 VIEW COMPARISON:  Chest x-ray 09/17/2015 FINDINGS: The heart size and mediastinal contours are within normal limits. Underinflation. No consolidation, pneumothorax or effusion. No edema. The visualized skeletal structures are unremarkable. Overlapping cardiac leads. Film is under penetrated. Degenerative changes of the spine IMPRESSION: Underinflation.  No acute cardiopulmonary disease. Electronically Signed   By: Karen Kays M.D.   On: 09/13/2023 19:43    Microbiology: Results for orders placed or performed during the hospital encounter of 09/13/23  Resp panel by RT-PCR (RSV, Flu A&B, Covid) Anterior Nasal Swab     Status: None   Collection Time: 09/13/23  5:15 PM   Specimen: Anterior Nasal Swab  Result Value Ref Range Status   SARS Coronavirus 2 by RT PCR NEGATIVE NEGATIVE Final    Comment: (NOTE) SARS-CoV-2 target nucleic acids are NOT DETECTED.  The SARS-CoV-2 RNA is generally detectable in upper respiratory specimens during the acute phase of infection. The lowest concentration of SARS-CoV-2 viral copies this assay can detect is 138 copies/mL. A negative result does not preclude SARS-Cov-2 infection and should not be used as the sole basis for treatment or other  patient management decisions. A negative result may occur with  improper specimen collection/handling, submission of specimen other than nasopharyngeal swab, presence of viral mutation(s) within the areas targeted by this assay, and inadequate number of viral copies(<138 copies/mL). A negative result must be combined with clinical observations, patient history, and epidemiological information. The expected result is Negative.  Fact Sheet for Patients:  BloggerCourse.com  Fact Sheet for Healthcare Providers:  SeriousBroker.it  This test is no t yet approved or cleared by the [place] FDA and  has been authorized for detection and/or diagnosis of SARS-CoV-2 by FDA under an Emergency Use Authorization (EUA). This EUA will remain  in effect (meaning this test can be used) for the duration of the COVID-19 declaration under Section 564(b)(1) of the Act, 21 U.S.C.section 360bbb-3(b)(1), unless the authorization is terminated  or revoked sooner.       Influenza A by PCR NEGATIVE NEGATIVE Final   Influenza B by PCR NEGATIVE NEGATIVE Final    Comment: (NOTE) The Xpert Xpress SARS-CoV-2/FLU/RSV plus assay is intended as an aid in the diagnosis of influenza from Nasopharyngeal swab specimens and should not be used as a sole basis for treatment. Nasal washings and aspirates are unacceptable for Xpert Xpress SARS-CoV-2/FLU/RSV testing.  Fact Sheet for Patients: BloggerCourse.com  Fact Sheet for Healthcare Providers: SeriousBroker.it  This test is not yet approved or cleared by the [place] FDA and has been authorized for detection and/or diagnosis of SARS-CoV-2 by FDA under an Emergency Use Authorization (EUA). This EUA will remain in effect (  meaning this test can be used) for the duration of the COVID-19 declaration under Section 564(b)(1) of the Act, 21 U.S.C. section  360bbb-3(b)(1), unless the authorization is terminated or revoked.     Resp Syncytial Virus by PCR NEGATIVE NEGATIVE Final    Comment: (NOTE) Fact Sheet for Patients: BloggerCourse.com  Fact Sheet for Healthcare Providers: SeriousBroker.it  This test is not yet approved or cleared by the [place] FDA and has been authorized for detection and/or diagnosis of SARS-CoV-2 by FDA under an Emergency Use Authorization (EUA). This EUA will remain in effect (meaning this test can be used) for the duration of the COVID-19 declaration under Section 564(b)(1) of the Act, 21 U.S.C. section 360bbb-3(b)(1), unless the authorization is terminated or revoked.  Performed at [facility], [address] [place], [place] [address]   Blood Culture (routine x 2)     Status: Abnormal (Preliminary result)   Collection Time: 09/13/23  5:29 PM   Specimen: BLOOD RIGHT HAND  Result Value Ref Range Status   Specimen Description   Final    BLOOD RIGHT HAND Performed at [facility], [address], [place], [place] [address]    Special Requests   Final    BOTTLES DRAWN AEROBIC AND ANAEROBIC Blood Culture adequate volume Performed at [facility], [address], [place], [place] [address]    Culture  Setup Time   Final    GRAM NEGATIVE RODS IN BOTH AEROBIC AND ANAEROBIC BOTTLES CRITICAL RESULT CALLED TO, READ BACK BY AND VERIFIED WITH: Wille Glaser LEGGE [id number] AT 0811 BY EC Performed at [facility], [address], [place], [place] [address]    Culture KLEBSIELLA PNEUMONIAE (A)  Final   Report Status PENDING  Incomplete   Organism ID, Bacteria KLEBSIELLA PNEUMONIAE  Final   Organism ID, Bacteria KLEBSIELLA PNEUMONIAE  Final      Susceptibility   Klebsiella pneumoniae - MIC*    AMPICILLIN RESISTANT Resistant     CEFEPIME <=0.12 SENSITIVE Sensitive     CEFTAZIDIME <=1 SENSITIVE Sensitive      CEFTRIAXONE <=0.25 SENSITIVE Sensitive     CIPROFLOXACIN <=0.25 SENSITIVE Sensitive     GENTAMICIN <=1 SENSITIVE Sensitive     IMIPENEM <=0.25 SENSITIVE Sensitive     TRIMETH/SULFA <=20 SENSITIVE Sensitive     AMPICILLIN/SULBACTAM <=2 SENSITIVE Sensitive     PIP/TAZO <=4 SENSITIVE Sensitive ug/mL   Klebsiella pneumoniae - KIRBY BAUER*    CEFAZOLIN INTERMEDIATE Intermediate     * KLEBSIELLA PNEUMONIAE    KLEBSIELLA PNEUMONIAE  Blood Culture ID Panel (Reflexed)     Status: Abnormal   Collection Time: 09/13/23  5:29 PM  Result Value Ref Range Status   Enterococcus faecalis NOT DETECTED NOT DETECTED Final   Enterococcus Faecium NOT DETECTED NOT DETECTED Final   Listeria monocytogenes NOT DETECTED NOT DETECTED Final   Staphylococcus species NOT DETECTED NOT DETECTED Final   Staphylococcus aureus (BCID) NOT DETECTED NOT DETECTED Final   Staphylococcus epidermidis NOT DETECTED NOT DETECTED Final   Staphylococcus lugdunensis NOT DETECTED NOT DETECTED Final   Streptococcus species NOT DETECTED NOT DETECTED Final   Streptococcus agalactiae NOT DETECTED NOT DETECTED Final   Streptococcus pneumoniae NOT DETECTED NOT DETECTED Final   Streptococcus pyogenes NOT DETECTED NOT DETECTED Final   A.calcoaceticus-baumannii NOT DETECTED NOT DETECTED Final   Bacteroides fragilis NOT DETECTED NOT DETECTED Final   Enterobacterales DETECTED (A) NOT DETECTED Final    Comment: Enterobacterales represent a large order  of gram negative bacteria, not a single organism. CRITICAL RESULT CALLED TO, READ BACK BY AND VERIFIED WITH: PHARMD JUSTIN LEGGE [id number] AT 0811 BY EC    Enterobacter cloacae complex NOT DETECTED NOT DETECTED Final   Escherichia coli NOT DETECTED NOT DETECTED Final   Klebsiella aerogenes NOT DETECTED NOT DETECTED Final   Klebsiella oxytoca NOT DETECTED NOT DETECTED Final   Klebsiella pneumoniae DETECTED (A) NOT DETECTED Final    Comment: CRITICAL RESULT CALLED TO, READ BACK BY AND VERIFIED  WITH: PHARMD JUSTIN LEGGE [id number] AT 0811 BY EC    Proteus species NOT DETECTED NOT DETECTED Final   Salmonella species NOT DETECTED NOT DETECTED Final   Serratia marcescens NOT DETECTED NOT DETECTED Final   Haemophilus influenzae NOT DETECTED NOT DETECTED Final   Neisseria meningitidis NOT DETECTED NOT DETECTED Final   Pseudomonas aeruginosa NOT DETECTED NOT DETECTED Final   Stenotrophomonas maltophilia NOT DETECTED NOT DETECTED Final   Candida albicans NOT DETECTED NOT DETECTED Final   Candida auris NOT DETECTED NOT DETECTED Final   Candida glabrata NOT DETECTED NOT DETECTED Final   Candida krusei NOT DETECTED NOT DETECTED Final   Candida parapsilosis NOT DETECTED NOT DETECTED Final   Candida tropicalis NOT DETECTED NOT DETECTED Final   Cryptococcus neoformans/gattii NOT DETECTED NOT DETECTED Final   CTX-M ESBL NOT DETECTED NOT DETECTED Final   Carbapenem resistance IMP NOT DETECTED NOT DETECTED Final   Carbapenem resistance KPC NOT DETECTED NOT DETECTED Final   Carbapenem resistance NDM NOT DETECTED NOT DETECTED Final   Carbapenem resist OXA 48 LIKE NOT DETECTED NOT DETECTED Final   Carbapenem resistance VIM NOT DETECTED NOT DETECTED Final    Comment: Performed at [facility], [address], [place], [place] [address]  Blood Culture (routine x 2)     Status: Abnormal (Preliminary result)   Collection Time: 09/13/23  5:38 PM   Specimen: BLOOD LEFT HAND  Result Value Ref Range Status   Specimen Description   Final    BLOOD LEFT HAND Performed at [facility], [address] [place], [place] [address]    Special Requests   Final    BOTTLES DRAWN AEROBIC AND ANAEROBIC Blood Culture adequate volume Performed at [facility], [address] [place], [place] [address]    Culture  Setup Time   Final    GRAM NEGATIVE RODS IN BOTH AEROBIC AND ANAEROBIC BOTTLES CRITICAL VALUE NOTED.  VALUE IS CONSISTENT WITH PREVIOUSLY REPORTED AND CALLED VALUE.     Culture (A)  Final    KLEBSIELLA PNEUMONIAE SUSCEPTIBILITIES PERFORMED ON PREVIOUS CULTURE WITHIN THE LAST 5 DAYS. Performed at [facility], [address], [place], [place] [address]    Report Status PENDING  Incomplete  Culture, blood (Routine X 2) w Reflex to ID Panel     Status: None (Preliminary result)   Collection Time: 09/14/23  2:49 PM   Specimen: BLOOD LEFT ARM  Result Value Ref Range Status   Specimen Description   Final    BLOOD LEFT ARM Performed at [facility], [address], [place], [place] [address]    Special Requests   Final    BOTTLES DRAWN AEROBIC AND ANAEROBIC Blood Culture adequate volume Performed at [facility], [address], [place], [place] [address]    Culture   Final    NO GROWTH 2 DAYS Performed at [facility], [address], [place], [place] [address]  Report Status PENDING  Incomplete    Labs: CBC: Recent Labs  Lab 09/13/23 1729 09/14/23 0449 09/15/23 0518 09/16/23 0500  WBC 14.3* 12.8* 9.0 6.1  NEUTROABS 12.8*  --   --   --   HGB 17.4* 14.6 14.4 14.0  HCT 51.6 43.9 42.5 42.7  MCV 87.0 87.8 88.2 90.7  PLT 197 169 161 168   Basic Metabolic Panel: Recent Labs  Lab 09/13/23 1729 09/14/23 0449 09/15/23 0518 09/16/23 0500  NA 137 133* 133* 133*  K 3.3* 3.6 4.1 3.9  CL 101 102 101 102  CO2 21* 23 25 24   GLUCOSE 140* 108* 148* 108*  BUN 16 13 18 17   CREATININE 1.35* 1.26* 1.38* 1.35*  CALCIUM 9.1 8.2* 8.3* 8.1*   Liver Function Tests: Recent Labs  Lab 09/13/23 1729 09/14/23 0449 09/15/23 0518 09/16/23 0500  AST 585* 205* 78* 43*  ALT 367* 246* 147* 104*  ALKPHOS 213* 163* 154* 172*  BILITOT 4.0* 3.7* 5.0* 2.9*  PROT 7.2 5.9* 5.8* 5.8*  ALBUMIN 3.9 3.2* 3.1* 3.1*   CBG: No results for input(s): "GLUCAP" in the last 168 hours.  Discharge time spent: greater than 30 minutes.  Signed: Thad Ranger, MD [facility] 09/16/2023

## 2023-09-17 ENCOUNTER — Encounter (HOSPITAL_COMMUNITY): Payer: Self-pay | Admitting: Gastroenterology

## 2023-09-18 DIAGNOSIS — R945 Abnormal results of liver function studies: Secondary | ICD-10-CM | POA: Diagnosis not present

## 2023-09-19 LAB — CULTURE, BLOOD (ROUTINE X 2)
Culture: NO GROWTH
Culture: NO GROWTH
Special Requests: ADEQUATE
Special Requests: ADEQUATE
Special Requests: ADEQUATE

## 2023-09-24 DIAGNOSIS — I10 Essential (primary) hypertension: Secondary | ICD-10-CM | POA: Diagnosis not present

## 2023-09-24 DIAGNOSIS — Z9889 Other specified postprocedural states: Secondary | ICD-10-CM | POA: Diagnosis not present

## 2023-09-24 DIAGNOSIS — A419 Sepsis, unspecified organism: Secondary | ICD-10-CM | POA: Diagnosis not present

## 2023-09-24 DIAGNOSIS — Z09 Encounter for follow-up examination after completed treatment for conditions other than malignant neoplasm: Secondary | ICD-10-CM | POA: Diagnosis not present

## 2023-09-24 DIAGNOSIS — Z299 Encounter for prophylactic measures, unspecified: Secondary | ICD-10-CM | POA: Diagnosis not present

## 2023-09-27 DIAGNOSIS — H34812 Central retinal vein occlusion, left eye, with macular edema: Secondary | ICD-10-CM | POA: Diagnosis not present

## 2023-09-27 DIAGNOSIS — H35353 Cystoid macular degeneration, bilateral: Secondary | ICD-10-CM | POA: Diagnosis not present

## 2023-09-27 DIAGNOSIS — H35373 Puckering of macula, bilateral: Secondary | ICD-10-CM | POA: Diagnosis not present

## 2023-09-27 DIAGNOSIS — H5316 Psychophysical visual disturbances: Secondary | ICD-10-CM | POA: Diagnosis not present

## 2023-09-27 DIAGNOSIS — H353231 Exudative age-related macular degeneration, bilateral, with active choroidal neovascularization: Secondary | ICD-10-CM | POA: Diagnosis not present

## 2023-10-03 DIAGNOSIS — R7989 Other specified abnormal findings of blood chemistry: Secondary | ICD-10-CM | POA: Diagnosis not present

## 2023-10-18 DIAGNOSIS — D751 Secondary polycythemia: Secondary | ICD-10-CM | POA: Diagnosis not present

## 2023-10-22 DIAGNOSIS — N5201 Erectile dysfunction due to arterial insufficiency: Secondary | ICD-10-CM | POA: Diagnosis not present

## 2023-10-22 DIAGNOSIS — R3915 Urgency of urination: Secondary | ICD-10-CM | POA: Diagnosis not present

## 2023-10-22 DIAGNOSIS — E291 Testicular hypofunction: Secondary | ICD-10-CM | POA: Diagnosis not present

## 2023-10-23 DIAGNOSIS — Z79899 Other long term (current) drug therapy: Secondary | ICD-10-CM | POA: Diagnosis not present

## 2023-10-23 DIAGNOSIS — Z299 Encounter for prophylactic measures, unspecified: Secondary | ICD-10-CM | POA: Diagnosis not present

## 2023-10-23 DIAGNOSIS — I10 Essential (primary) hypertension: Secondary | ICD-10-CM | POA: Diagnosis not present

## 2023-10-23 DIAGNOSIS — F17210 Nicotine dependence, cigarettes, uncomplicated: Secondary | ICD-10-CM | POA: Diagnosis not present

## 2023-10-23 DIAGNOSIS — R5383 Other fatigue: Secondary | ICD-10-CM | POA: Diagnosis not present

## 2023-10-25 DIAGNOSIS — H353231 Exudative age-related macular degeneration, bilateral, with active choroidal neovascularization: Secondary | ICD-10-CM | POA: Diagnosis not present

## 2023-11-15 DIAGNOSIS — B078 Other viral warts: Secondary | ICD-10-CM | POA: Diagnosis not present

## 2023-11-15 DIAGNOSIS — L308 Other specified dermatitis: Secondary | ICD-10-CM | POA: Diagnosis not present

## 2023-11-15 DIAGNOSIS — B353 Tinea pedis: Secondary | ICD-10-CM | POA: Diagnosis not present

## 2023-12-05 DIAGNOSIS — D692 Other nonthrombocytopenic purpura: Secondary | ICD-10-CM | POA: Diagnosis not present

## 2023-12-05 DIAGNOSIS — E039 Hypothyroidism, unspecified: Secondary | ICD-10-CM | POA: Diagnosis not present

## 2023-12-05 DIAGNOSIS — F419 Anxiety disorder, unspecified: Secondary | ICD-10-CM | POA: Diagnosis not present

## 2023-12-05 DIAGNOSIS — I10 Essential (primary) hypertension: Secondary | ICD-10-CM | POA: Diagnosis not present

## 2023-12-05 DIAGNOSIS — F17210 Nicotine dependence, cigarettes, uncomplicated: Secondary | ICD-10-CM | POA: Diagnosis not present

## 2023-12-05 DIAGNOSIS — Z299 Encounter for prophylactic measures, unspecified: Secondary | ICD-10-CM | POA: Diagnosis not present

## 2023-12-06 DIAGNOSIS — H353231 Exudative age-related macular degeneration, bilateral, with active choroidal neovascularization: Secondary | ICD-10-CM | POA: Diagnosis not present

## 2023-12-24 DIAGNOSIS — E291 Testicular hypofunction: Secondary | ICD-10-CM | POA: Diagnosis not present

## 2023-12-24 DIAGNOSIS — D751 Secondary polycythemia: Secondary | ICD-10-CM | POA: Diagnosis not present

## 2023-12-28 DIAGNOSIS — E291 Testicular hypofunction: Secondary | ICD-10-CM | POA: Diagnosis not present

## 2023-12-28 DIAGNOSIS — N5201 Erectile dysfunction due to arterial insufficiency: Secondary | ICD-10-CM | POA: Diagnosis not present

## 2024-01-03 DIAGNOSIS — H35373 Puckering of macula, bilateral: Secondary | ICD-10-CM | POA: Diagnosis not present

## 2024-01-03 DIAGNOSIS — H3582 Retinal ischemia: Secondary | ICD-10-CM | POA: Diagnosis not present

## 2024-01-03 DIAGNOSIS — H04123 Dry eye syndrome of bilateral lacrimal glands: Secondary | ICD-10-CM | POA: Diagnosis not present

## 2024-01-03 DIAGNOSIS — H353231 Exudative age-related macular degeneration, bilateral, with active choroidal neovascularization: Secondary | ICD-10-CM | POA: Diagnosis not present

## 2024-01-03 DIAGNOSIS — H34812 Central retinal vein occlusion, left eye, with macular edema: Secondary | ICD-10-CM | POA: Diagnosis not present

## 2024-01-03 DIAGNOSIS — H35351 Cystoid macular degeneration, right eye: Secondary | ICD-10-CM | POA: Diagnosis not present

## 2024-01-03 DIAGNOSIS — H3562 Retinal hemorrhage, left eye: Secondary | ICD-10-CM | POA: Diagnosis not present

## 2024-01-07 DIAGNOSIS — H401113 Primary open-angle glaucoma, right eye, severe stage: Secondary | ICD-10-CM | POA: Diagnosis not present

## 2024-01-07 DIAGNOSIS — H401122 Primary open-angle glaucoma, left eye, moderate stage: Secondary | ICD-10-CM | POA: Diagnosis not present

## 2024-01-31 DIAGNOSIS — M9903 Segmental and somatic dysfunction of lumbar region: Secondary | ICD-10-CM | POA: Diagnosis not present

## 2024-01-31 DIAGNOSIS — M5431 Sciatica, right side: Secondary | ICD-10-CM | POA: Diagnosis not present

## 2024-02-14 DIAGNOSIS — H353231 Exudative age-related macular degeneration, bilateral, with active choroidal neovascularization: Secondary | ICD-10-CM | POA: Diagnosis not present

## 2024-03-12 DIAGNOSIS — Z79899 Other long term (current) drug therapy: Secondary | ICD-10-CM | POA: Diagnosis not present

## 2024-03-12 DIAGNOSIS — R5383 Other fatigue: Secondary | ICD-10-CM | POA: Diagnosis not present

## 2024-03-12 DIAGNOSIS — E039 Hypothyroidism, unspecified: Secondary | ICD-10-CM | POA: Diagnosis not present

## 2024-03-12 DIAGNOSIS — Z299 Encounter for prophylactic measures, unspecified: Secondary | ICD-10-CM | POA: Diagnosis not present

## 2024-03-12 DIAGNOSIS — E7800 Pure hypercholesterolemia, unspecified: Secondary | ICD-10-CM | POA: Diagnosis not present

## 2024-03-12 DIAGNOSIS — Z7189 Other specified counseling: Secondary | ICD-10-CM | POA: Diagnosis not present

## 2024-03-12 DIAGNOSIS — Z1339 Encounter for screening examination for other mental health and behavioral disorders: Secondary | ICD-10-CM | POA: Diagnosis not present

## 2024-03-12 DIAGNOSIS — I10 Essential (primary) hypertension: Secondary | ICD-10-CM | POA: Diagnosis not present

## 2024-03-12 DIAGNOSIS — Z125 Encounter for screening for malignant neoplasm of prostate: Secondary | ICD-10-CM | POA: Diagnosis not present

## 2024-03-12 DIAGNOSIS — F17210 Nicotine dependence, cigarettes, uncomplicated: Secondary | ICD-10-CM | POA: Diagnosis not present

## 2024-03-12 DIAGNOSIS — Z0000 Encounter for general adult medical examination without abnormal findings: Secondary | ICD-10-CM | POA: Diagnosis not present

## 2024-03-12 DIAGNOSIS — Z1331 Encounter for screening for depression: Secondary | ICD-10-CM | POA: Diagnosis not present

## 2024-03-20 DIAGNOSIS — H34812 Central retinal vein occlusion, left eye, with macular edema: Secondary | ICD-10-CM | POA: Diagnosis not present

## 2024-03-20 DIAGNOSIS — H04123 Dry eye syndrome of bilateral lacrimal glands: Secondary | ICD-10-CM | POA: Diagnosis not present

## 2024-03-20 DIAGNOSIS — H353231 Exudative age-related macular degeneration, bilateral, with active choroidal neovascularization: Secondary | ICD-10-CM | POA: Diagnosis not present

## 2024-03-20 DIAGNOSIS — Z79899 Other long term (current) drug therapy: Secondary | ICD-10-CM | POA: Diagnosis not present

## 2024-03-20 DIAGNOSIS — H3582 Retinal ischemia: Secondary | ICD-10-CM | POA: Diagnosis not present

## 2024-03-20 DIAGNOSIS — H35351 Cystoid macular degeneration, right eye: Secondary | ICD-10-CM | POA: Diagnosis not present

## 2024-03-20 DIAGNOSIS — H35373 Puckering of macula, bilateral: Secondary | ICD-10-CM | POA: Diagnosis not present

## 2024-03-20 DIAGNOSIS — H3562 Retinal hemorrhage, left eye: Secondary | ICD-10-CM | POA: Diagnosis not present

## 2024-03-21 DIAGNOSIS — H6123 Impacted cerumen, bilateral: Secondary | ICD-10-CM | POA: Diagnosis not present

## 2024-03-21 DIAGNOSIS — Z299 Encounter for prophylactic measures, unspecified: Secondary | ICD-10-CM | POA: Diagnosis not present

## 2024-03-21 DIAGNOSIS — I10 Essential (primary) hypertension: Secondary | ICD-10-CM | POA: Diagnosis not present

## 2024-04-02 DIAGNOSIS — M5431 Sciatica, right side: Secondary | ICD-10-CM | POA: Diagnosis not present

## 2024-04-02 DIAGNOSIS — M9903 Segmental and somatic dysfunction of lumbar region: Secondary | ICD-10-CM | POA: Diagnosis not present

## 2024-05-01 DIAGNOSIS — H34812 Central retinal vein occlusion, left eye, with macular edema: Secondary | ICD-10-CM | POA: Diagnosis not present

## 2024-05-01 DIAGNOSIS — H3582 Retinal ischemia: Secondary | ICD-10-CM | POA: Diagnosis not present

## 2024-05-01 DIAGNOSIS — H3562 Retinal hemorrhage, left eye: Secondary | ICD-10-CM | POA: Diagnosis not present

## 2024-05-01 DIAGNOSIS — H353231 Exudative age-related macular degeneration, bilateral, with active choroidal neovascularization: Secondary | ICD-10-CM | POA: Diagnosis not present

## 2024-05-01 DIAGNOSIS — H35373 Puckering of macula, bilateral: Secondary | ICD-10-CM | POA: Diagnosis not present

## 2024-05-01 DIAGNOSIS — H04123 Dry eye syndrome of bilateral lacrimal glands: Secondary | ICD-10-CM | POA: Diagnosis not present

## 2024-05-07 DIAGNOSIS — H34812 Central retinal vein occlusion, left eye, with macular edema: Secondary | ICD-10-CM | POA: Diagnosis not present

## 2024-05-07 DIAGNOSIS — H5712 Ocular pain, left eye: Secondary | ICD-10-CM | POA: Diagnosis not present

## 2024-05-07 DIAGNOSIS — H44002 Unspecified purulent endophthalmitis, left eye: Secondary | ICD-10-CM | POA: Diagnosis not present

## 2024-05-07 DIAGNOSIS — H353231 Exudative age-related macular degeneration, bilateral, with active choroidal neovascularization: Secondary | ICD-10-CM | POA: Diagnosis not present

## 2024-05-07 DIAGNOSIS — H348122 Central retinal vein occlusion, left eye, stable: Secondary | ICD-10-CM | POA: Diagnosis not present

## 2024-05-07 DIAGNOSIS — H3562 Retinal hemorrhage, left eye: Secondary | ICD-10-CM | POA: Diagnosis not present

## 2024-05-07 DIAGNOSIS — H3582 Retinal ischemia: Secondary | ICD-10-CM | POA: Diagnosis not present

## 2024-05-07 DIAGNOSIS — H35373 Puckering of macula, bilateral: Secondary | ICD-10-CM | POA: Diagnosis not present

## 2024-05-07 DIAGNOSIS — H35351 Cystoid macular degeneration, right eye: Secondary | ICD-10-CM | POA: Diagnosis not present

## 2024-05-07 DIAGNOSIS — H401113 Primary open-angle glaucoma, right eye, severe stage: Secondary | ICD-10-CM | POA: Diagnosis not present

## 2024-05-07 DIAGNOSIS — H35353 Cystoid macular degeneration, bilateral: Secondary | ICD-10-CM | POA: Diagnosis not present

## 2024-05-07 DIAGNOSIS — H401122 Primary open-angle glaucoma, left eye, moderate stage: Secondary | ICD-10-CM | POA: Diagnosis not present

## 2024-05-08 DIAGNOSIS — H353231 Exudative age-related macular degeneration, bilateral, with active choroidal neovascularization: Secondary | ICD-10-CM | POA: Diagnosis not present

## 2024-05-08 DIAGNOSIS — H3582 Retinal ischemia: Secondary | ICD-10-CM | POA: Diagnosis not present

## 2024-05-08 DIAGNOSIS — H34812 Central retinal vein occlusion, left eye, with macular edema: Secondary | ICD-10-CM | POA: Diagnosis not present

## 2024-05-08 DIAGNOSIS — H35373 Puckering of macula, bilateral: Secondary | ICD-10-CM | POA: Diagnosis not present

## 2024-05-08 DIAGNOSIS — H3562 Retinal hemorrhage, left eye: Secondary | ICD-10-CM | POA: Diagnosis not present

## 2024-05-08 DIAGNOSIS — H44002 Unspecified purulent endophthalmitis, left eye: Secondary | ICD-10-CM | POA: Diagnosis not present

## 2024-05-08 DIAGNOSIS — H20052 Hypopyon, left eye: Secondary | ICD-10-CM | POA: Diagnosis not present

## 2024-05-15 DIAGNOSIS — H35373 Puckering of macula, bilateral: Secondary | ICD-10-CM | POA: Diagnosis not present

## 2024-05-15 DIAGNOSIS — H34812 Central retinal vein occlusion, left eye, with macular edema: Secondary | ICD-10-CM | POA: Diagnosis not present

## 2024-05-15 DIAGNOSIS — H44002 Unspecified purulent endophthalmitis, left eye: Secondary | ICD-10-CM | POA: Diagnosis not present

## 2024-05-15 DIAGNOSIS — H353231 Exudative age-related macular degeneration, bilateral, with active choroidal neovascularization: Secondary | ICD-10-CM | POA: Diagnosis not present

## 2024-05-22 DIAGNOSIS — H44002 Unspecified purulent endophthalmitis, left eye: Secondary | ICD-10-CM | POA: Diagnosis not present

## 2024-05-22 DIAGNOSIS — H20052 Hypopyon, left eye: Secondary | ICD-10-CM | POA: Diagnosis not present

## 2024-05-22 DIAGNOSIS — H353231 Exudative age-related macular degeneration, bilateral, with active choroidal neovascularization: Secondary | ICD-10-CM | POA: Diagnosis not present

## 2024-05-22 DIAGNOSIS — H34812 Central retinal vein occlusion, left eye, with macular edema: Secondary | ICD-10-CM | POA: Diagnosis not present

## 2024-05-22 DIAGNOSIS — H35373 Puckering of macula, bilateral: Secondary | ICD-10-CM | POA: Diagnosis not present

## 2024-05-22 DIAGNOSIS — H3562 Retinal hemorrhage, left eye: Secondary | ICD-10-CM | POA: Diagnosis not present

## 2024-05-22 DIAGNOSIS — H3582 Retinal ischemia: Secondary | ICD-10-CM | POA: Diagnosis not present

## 2024-06-09 DIAGNOSIS — H401113 Primary open-angle glaucoma, right eye, severe stage: Secondary | ICD-10-CM | POA: Diagnosis not present

## 2024-06-09 DIAGNOSIS — H401122 Primary open-angle glaucoma, left eye, moderate stage: Secondary | ICD-10-CM | POA: Diagnosis not present

## 2024-06-12 DIAGNOSIS — H3562 Retinal hemorrhage, left eye: Secondary | ICD-10-CM | POA: Diagnosis not present

## 2024-06-12 DIAGNOSIS — H34812 Central retinal vein occlusion, left eye, with macular edema: Secondary | ICD-10-CM | POA: Diagnosis not present

## 2024-06-12 DIAGNOSIS — H3582 Retinal ischemia: Secondary | ICD-10-CM | POA: Diagnosis not present

## 2024-06-12 DIAGNOSIS — H44002 Unspecified purulent endophthalmitis, left eye: Secondary | ICD-10-CM | POA: Diagnosis not present

## 2024-06-12 DIAGNOSIS — H35353 Cystoid macular degeneration, bilateral: Secondary | ICD-10-CM | POA: Diagnosis not present

## 2024-06-12 DIAGNOSIS — H5316 Psychophysical visual disturbances: Secondary | ICD-10-CM | POA: Diagnosis not present

## 2024-06-12 DIAGNOSIS — H35373 Puckering of macula, bilateral: Secondary | ICD-10-CM | POA: Diagnosis not present

## 2024-06-12 DIAGNOSIS — H353231 Exudative age-related macular degeneration, bilateral, with active choroidal neovascularization: Secondary | ICD-10-CM | POA: Diagnosis not present

## 2024-06-12 DIAGNOSIS — H548 Legal blindness, as defined in USA: Secondary | ICD-10-CM | POA: Diagnosis not present

## 2024-07-09 ENCOUNTER — Other Ambulatory Visit: Payer: Self-pay | Admitting: Specialist

## 2024-07-14 DIAGNOSIS — H35353 Cystoid macular degeneration, bilateral: Secondary | ICD-10-CM | POA: Diagnosis not present

## 2024-07-14 DIAGNOSIS — H3562 Retinal hemorrhage, left eye: Secondary | ICD-10-CM | POA: Diagnosis not present

## 2024-07-14 DIAGNOSIS — H34812 Central retinal vein occlusion, left eye, with macular edema: Secondary | ICD-10-CM | POA: Diagnosis not present

## 2024-07-14 DIAGNOSIS — H44002 Unspecified purulent endophthalmitis, left eye: Secondary | ICD-10-CM | POA: Diagnosis not present

## 2024-07-14 DIAGNOSIS — H353231 Exudative age-related macular degeneration, bilateral, with active choroidal neovascularization: Secondary | ICD-10-CM | POA: Diagnosis not present

## 2024-07-14 DIAGNOSIS — H5316 Psychophysical visual disturbances: Secondary | ICD-10-CM | POA: Diagnosis not present

## 2024-07-14 DIAGNOSIS — H3582 Retinal ischemia: Secondary | ICD-10-CM | POA: Diagnosis not present

## 2024-07-14 DIAGNOSIS — H35373 Puckering of macula, bilateral: Secondary | ICD-10-CM | POA: Diagnosis not present

## 2024-07-28 DIAGNOSIS — Z23 Encounter for immunization: Secondary | ICD-10-CM | POA: Diagnosis not present

## 2024-08-04 DIAGNOSIS — H401113 Primary open-angle glaucoma, right eye, severe stage: Secondary | ICD-10-CM | POA: Diagnosis not present

## 2024-08-04 DIAGNOSIS — H401122 Primary open-angle glaucoma, left eye, moderate stage: Secondary | ICD-10-CM | POA: Diagnosis not present

## 2024-08-21 DIAGNOSIS — H35353 Cystoid macular degeneration, bilateral: Secondary | ICD-10-CM | POA: Diagnosis not present

## 2024-08-21 DIAGNOSIS — H3582 Retinal ischemia: Secondary | ICD-10-CM | POA: Diagnosis not present

## 2024-08-21 DIAGNOSIS — H43813 Vitreous degeneration, bilateral: Secondary | ICD-10-CM | POA: Diagnosis not present

## 2024-08-21 DIAGNOSIS — H353231 Exudative age-related macular degeneration, bilateral, with active choroidal neovascularization: Secondary | ICD-10-CM | POA: Diagnosis not present

## 2024-08-21 DIAGNOSIS — H34812 Central retinal vein occlusion, left eye, with macular edema: Secondary | ICD-10-CM | POA: Diagnosis not present

## 2024-08-21 DIAGNOSIS — H3562 Retinal hemorrhage, left eye: Secondary | ICD-10-CM | POA: Diagnosis not present

## 2024-08-21 DIAGNOSIS — H44002 Unspecified purulent endophthalmitis, left eye: Secondary | ICD-10-CM | POA: Diagnosis not present

## 2024-08-21 DIAGNOSIS — H5316 Psychophysical visual disturbances: Secondary | ICD-10-CM | POA: Diagnosis not present

## 2024-08-21 DIAGNOSIS — H35373 Puckering of macula, bilateral: Secondary | ICD-10-CM | POA: Diagnosis not present

## 2024-08-27 DIAGNOSIS — Z1330 Encounter for screening examination for mental health and behavioral disorders, unspecified: Secondary | ICD-10-CM | POA: Diagnosis not present

## 2024-08-27 DIAGNOSIS — E039 Hypothyroidism, unspecified: Secondary | ICD-10-CM | POA: Diagnosis not present

## 2024-08-27 DIAGNOSIS — M48061 Spinal stenosis, lumbar region without neurogenic claudication: Secondary | ICD-10-CM | POA: Diagnosis not present

## 2024-08-27 DIAGNOSIS — R7989 Other specified abnormal findings of blood chemistry: Secondary | ICD-10-CM | POA: Diagnosis not present

## 2024-08-27 DIAGNOSIS — N1831 Chronic kidney disease, stage 3a: Secondary | ICD-10-CM | POA: Diagnosis not present

## 2024-08-27 DIAGNOSIS — Z13220 Encounter for screening for lipoid disorders: Secondary | ICD-10-CM | POA: Diagnosis not present

## 2024-08-27 DIAGNOSIS — Z130 Encounter for screening for diseases of the blood and blood-forming organs and certain disorders involving the immune mechanism: Secondary | ICD-10-CM | POA: Diagnosis not present

## 2024-08-27 DIAGNOSIS — Z0000 Encounter for general adult medical examination without abnormal findings: Secondary | ICD-10-CM | POA: Diagnosis not present

## 2024-08-27 DIAGNOSIS — M5126 Other intervertebral disc displacement, lumbar region: Secondary | ICD-10-CM | POA: Diagnosis not present

## 2024-08-27 DIAGNOSIS — Z131 Encounter for screening for diabetes mellitus: Secondary | ICD-10-CM | POA: Diagnosis not present

## 2024-08-29 DIAGNOSIS — Z125 Encounter for screening for malignant neoplasm of prostate: Secondary | ICD-10-CM | POA: Diagnosis not present

## 2024-08-29 DIAGNOSIS — R7989 Other specified abnormal findings of blood chemistry: Secondary | ICD-10-CM | POA: Diagnosis not present

## 2024-08-29 DIAGNOSIS — E291 Testicular hypofunction: Secondary | ICD-10-CM | POA: Diagnosis not present

## 2024-10-09 DIAGNOSIS — H5316 Psychophysical visual disturbances: Secondary | ICD-10-CM | POA: Diagnosis not present

## 2024-10-09 DIAGNOSIS — H34812 Central retinal vein occlusion, left eye, with macular edema: Secondary | ICD-10-CM | POA: Diagnosis not present

## 2024-10-09 DIAGNOSIS — Z961 Presence of intraocular lens: Secondary | ICD-10-CM | POA: Diagnosis not present

## 2024-10-09 DIAGNOSIS — H353231 Exudative age-related macular degeneration, bilateral, with active choroidal neovascularization: Secondary | ICD-10-CM | POA: Diagnosis not present

## 2024-10-09 DIAGNOSIS — H5372 Impaired contrast sensitivity: Secondary | ICD-10-CM | POA: Diagnosis not present

## 2024-10-09 DIAGNOSIS — H3582 Retinal ischemia: Secondary | ICD-10-CM | POA: Diagnosis not present

## 2024-10-09 DIAGNOSIS — H18413 Arcus senilis, bilateral: Secondary | ICD-10-CM | POA: Diagnosis not present

## 2024-10-09 DIAGNOSIS — H548 Legal blindness, as defined in USA: Secondary | ICD-10-CM | POA: Diagnosis not present

## 2024-10-09 DIAGNOSIS — H3581 Retinal edema: Secondary | ICD-10-CM | POA: Diagnosis not present

## 2024-10-09 DIAGNOSIS — H35353 Cystoid macular degeneration, bilateral: Secondary | ICD-10-CM | POA: Diagnosis not present

## 2024-10-09 DIAGNOSIS — H44002 Unspecified purulent endophthalmitis, left eye: Secondary | ICD-10-CM | POA: Diagnosis not present

## 2024-10-09 DIAGNOSIS — H35361 Drusen (degenerative) of macula, right eye: Secondary | ICD-10-CM | POA: Diagnosis not present

## 2024-10-09 DIAGNOSIS — H3562 Retinal hemorrhage, left eye: Secondary | ICD-10-CM | POA: Diagnosis not present

## 2024-10-09 DIAGNOSIS — H35373 Puckering of macula, bilateral: Secondary | ICD-10-CM | POA: Diagnosis not present

## 2024-10-20 DIAGNOSIS — H401113 Primary open-angle glaucoma, right eye, severe stage: Secondary | ICD-10-CM | POA: Diagnosis not present

## 2024-10-20 DIAGNOSIS — H401122 Primary open-angle glaucoma, left eye, moderate stage: Secondary | ICD-10-CM | POA: Diagnosis not present
